# Patient Record
Sex: Female | Born: 1970 | Hispanic: No | Marital: Single | Smoking: Never smoker
Health system: Southern US, Community
[De-identification: ages and names within clinical notes are randomized; demographics above are authoritative.]

## PROBLEM LIST (undated history)

## (undated) DIAGNOSIS — Z973 Presence of spectacles and contact lenses: Secondary | ICD-10-CM

## (undated) DIAGNOSIS — G629 Polyneuropathy, unspecified: Secondary | ICD-10-CM

## (undated) DIAGNOSIS — G600 Hereditary motor and sensory neuropathy: Secondary | ICD-10-CM

## (undated) DIAGNOSIS — J302 Other seasonal allergic rhinitis: Secondary | ICD-10-CM

## (undated) DIAGNOSIS — K219 Gastro-esophageal reflux disease without esophagitis: Secondary | ICD-10-CM

## (undated) DIAGNOSIS — K5909 Other constipation: Secondary | ICD-10-CM

## (undated) DIAGNOSIS — N840 Polyp of corpus uteri: Secondary | ICD-10-CM

## (undated) DIAGNOSIS — J45909 Unspecified asthma, uncomplicated: Secondary | ICD-10-CM

## (undated) DIAGNOSIS — T7840XA Allergy, unspecified, initial encounter: Secondary | ICD-10-CM

## (undated) HISTORY — DX: Hereditary motor and sensory neuropathy: G60.0

## (undated) HISTORY — PX: DILATION AND CURETTAGE OF UTERUS: SHX78

## (undated) HISTORY — DX: Unspecified asthma, uncomplicated: J45.909

## (undated) HISTORY — PX: TONSILLECTOMY: SUR1361

## (undated) HISTORY — DX: Allergy, unspecified, initial encounter: T78.40XA

---

## 1995-03-30 HISTORY — PX: OTHER SURGICAL HISTORY: SHX169

## 1997-03-29 HISTORY — PX: TUBAL LIGATION: SHX77

## 1997-07-19 ENCOUNTER — Other Ambulatory Visit: Admission: RE | Admit: 1997-07-19 | Discharge: 1997-07-19 | Payer: Self-pay | Admitting: Gynecology

## 1998-02-07 ENCOUNTER — Ambulatory Visit (HOSPITAL_COMMUNITY): Admission: AD | Admit: 1998-02-07 | Discharge: 1998-02-07 | Payer: Self-pay | Admitting: Gynecology

## 1998-05-01 ENCOUNTER — Other Ambulatory Visit: Admission: RE | Admit: 1998-05-01 | Discharge: 1998-05-01 | Payer: Self-pay | Admitting: Obstetrics and Gynecology

## 1998-09-27 ENCOUNTER — Emergency Department (HOSPITAL_COMMUNITY): Admission: EM | Admit: 1998-09-27 | Discharge: 1998-09-27 | Payer: Self-pay | Admitting: Emergency Medicine

## 1999-09-04 ENCOUNTER — Ambulatory Visit (HOSPITAL_COMMUNITY): Admission: RE | Admit: 1999-09-04 | Discharge: 1999-09-04 | Payer: Self-pay | Admitting: Obstetrics and Gynecology

## 1999-09-04 ENCOUNTER — Encounter: Payer: Self-pay | Admitting: Obstetrics and Gynecology

## 1999-11-09 ENCOUNTER — Emergency Department (HOSPITAL_COMMUNITY): Admission: EM | Admit: 1999-11-09 | Discharge: 1999-11-09 | Payer: Self-pay | Admitting: Emergency Medicine

## 2000-03-23 ENCOUNTER — Other Ambulatory Visit: Admission: RE | Admit: 2000-03-23 | Discharge: 2000-03-23 | Payer: Self-pay | Admitting: Obstetrics and Gynecology

## 2001-01-04 ENCOUNTER — Other Ambulatory Visit: Admission: RE | Admit: 2001-01-04 | Discharge: 2001-01-04 | Payer: Self-pay | Admitting: Obstetrics and Gynecology

## 2001-12-30 ENCOUNTER — Encounter: Payer: Self-pay | Admitting: Emergency Medicine

## 2001-12-30 ENCOUNTER — Emergency Department (HOSPITAL_COMMUNITY): Admission: EM | Admit: 2001-12-30 | Discharge: 2001-12-30 | Payer: Self-pay | Admitting: Emergency Medicine

## 2002-01-06 ENCOUNTER — Encounter: Payer: Self-pay | Admitting: *Deleted

## 2002-01-06 ENCOUNTER — Inpatient Hospital Stay (HOSPITAL_COMMUNITY): Admission: AD | Admit: 2002-01-06 | Discharge: 2002-01-06 | Payer: Self-pay | Admitting: *Deleted

## 2002-06-15 ENCOUNTER — Other Ambulatory Visit: Admission: RE | Admit: 2002-06-15 | Discharge: 2002-06-15 | Payer: Self-pay | Admitting: Obstetrics and Gynecology

## 2003-02-10 ENCOUNTER — Inpatient Hospital Stay (HOSPITAL_COMMUNITY): Admission: AD | Admit: 2003-02-10 | Discharge: 2003-02-10 | Payer: Self-pay | Admitting: Obstetrics and Gynecology

## 2003-07-17 ENCOUNTER — Other Ambulatory Visit: Admission: RE | Admit: 2003-07-17 | Discharge: 2003-07-17 | Payer: Self-pay | Admitting: Obstetrics and Gynecology

## 2003-10-30 ENCOUNTER — Inpatient Hospital Stay (HOSPITAL_COMMUNITY): Admission: AD | Admit: 2003-10-30 | Discharge: 2003-10-30 | Payer: Self-pay | Admitting: Obstetrics and Gynecology

## 2003-11-02 ENCOUNTER — Inpatient Hospital Stay (HOSPITAL_COMMUNITY): Admission: AD | Admit: 2003-11-02 | Discharge: 2003-11-02 | Payer: Self-pay | Admitting: Obstetrics and Gynecology

## 2003-11-05 ENCOUNTER — Inpatient Hospital Stay (HOSPITAL_COMMUNITY): Admission: AD | Admit: 2003-11-05 | Discharge: 2003-11-05 | Payer: Self-pay | Admitting: Obstetrics and Gynecology

## 2003-11-13 ENCOUNTER — Inpatient Hospital Stay (HOSPITAL_COMMUNITY): Admission: AD | Admit: 2003-11-13 | Discharge: 2003-11-13 | Payer: Self-pay | Admitting: Obstetrics and Gynecology

## 2004-04-23 ENCOUNTER — Ambulatory Visit: Payer: Self-pay | Admitting: Family Medicine

## 2004-07-15 ENCOUNTER — Ambulatory Visit: Payer: Self-pay | Admitting: Family Medicine

## 2005-03-04 ENCOUNTER — Ambulatory Visit (HOSPITAL_COMMUNITY): Admission: RE | Admit: 2005-03-04 | Discharge: 2005-03-04 | Payer: Self-pay | Admitting: Obstetrics and Gynecology

## 2005-05-14 ENCOUNTER — Observation Stay (HOSPITAL_COMMUNITY): Admission: AD | Admit: 2005-05-14 | Discharge: 2005-05-15 | Payer: Self-pay | Admitting: Obstetrics and Gynecology

## 2005-05-14 ENCOUNTER — Encounter (INDEPENDENT_AMBULATORY_CARE_PROVIDER_SITE_OTHER): Payer: Self-pay | Admitting: Specialist

## 2005-10-16 ENCOUNTER — Emergency Department (HOSPITAL_COMMUNITY): Admission: EM | Admit: 2005-10-16 | Discharge: 2005-10-16 | Payer: Self-pay | Admitting: Emergency Medicine

## 2005-10-20 ENCOUNTER — Ambulatory Visit: Payer: Self-pay | Admitting: Obstetrics and Gynecology

## 2005-11-05 ENCOUNTER — Ambulatory Visit: Payer: Self-pay | Admitting: Gynecology

## 2005-12-01 ENCOUNTER — Ambulatory Visit: Payer: Self-pay | Admitting: Obstetrics and Gynecology

## 2005-12-29 ENCOUNTER — Ambulatory Visit (HOSPITAL_COMMUNITY): Admission: RE | Admit: 2005-12-29 | Discharge: 2005-12-29 | Payer: Self-pay | Admitting: Obstetrics and Gynecology

## 2005-12-29 ENCOUNTER — Ambulatory Visit: Payer: Self-pay | Admitting: Obstetrics and Gynecology

## 2006-01-13 ENCOUNTER — Ambulatory Visit: Payer: Self-pay | Admitting: Obstetrics and Gynecology

## 2006-05-17 ENCOUNTER — Inpatient Hospital Stay (HOSPITAL_COMMUNITY): Admission: AD | Admit: 2006-05-17 | Discharge: 2006-05-17 | Payer: Self-pay | Admitting: Obstetrics and Gynecology

## 2006-05-19 ENCOUNTER — Encounter (INDEPENDENT_AMBULATORY_CARE_PROVIDER_SITE_OTHER): Payer: Self-pay | Admitting: Specialist

## 2006-05-19 ENCOUNTER — Ambulatory Visit (HOSPITAL_COMMUNITY): Admission: AD | Admit: 2006-05-19 | Discharge: 2006-05-20 | Payer: Self-pay | Admitting: Obstetrics and Gynecology

## 2007-09-01 ENCOUNTER — Ambulatory Visit (HOSPITAL_COMMUNITY): Admission: RE | Admit: 2007-09-01 | Discharge: 2007-09-01 | Payer: Self-pay | Admitting: Obstetrics and Gynecology

## 2007-11-01 ENCOUNTER — Encounter: Admission: RE | Admit: 2007-11-01 | Discharge: 2007-11-01 | Payer: Self-pay | Admitting: Family Medicine

## 2008-12-05 ENCOUNTER — Emergency Department (HOSPITAL_COMMUNITY): Admission: EM | Admit: 2008-12-05 | Discharge: 2008-12-05 | Payer: Self-pay | Admitting: Emergency Medicine

## 2009-02-08 ENCOUNTER — Emergency Department (HOSPITAL_COMMUNITY): Admission: EM | Admit: 2009-02-08 | Discharge: 2009-02-08 | Payer: Self-pay | Admitting: Emergency Medicine

## 2010-01-29 ENCOUNTER — Encounter: Admission: RE | Admit: 2010-01-29 | Discharge: 2010-01-29 | Payer: Self-pay | Admitting: Family Medicine

## 2010-04-19 ENCOUNTER — Encounter: Payer: Self-pay | Admitting: Family Medicine

## 2010-07-30 ENCOUNTER — Telehealth: Payer: Self-pay | Admitting: *Deleted

## 2010-07-30 NOTE — Telephone Encounter (Signed)
Per Dr Jarold Motto he will not accept the patient from Dr Elnoria Howard, She was last seen with Dr. Elnoria Howard on 07/20/2010. Patient aware and she would like our office to ask another MD, I have given the records to Milford Cage she will forward them to Dr. Marina Goodell for review and she will contact patient with answer.

## 2010-08-04 NOTE — Telephone Encounter (Signed)
Dr Marina Goodell declined to accept patient into the practice also. I have left a message for patient on her voicemail.

## 2010-08-14 NOTE — Group Therapy Note (Signed)
NAME:  Brandi Erickson, Brandi Erickson NO.:  [id number]   MEDICAL RECORD NO.:  [id number]          PATIENT TYPE:  WOC   LOCATION:  [facility]                   FACILITY:  [facility]   PHYSICIAN:  Argentina Donovan, MD        DATE OF BIRTH:  07/17/1970   DATE OF SERVICE:  10/20/2005                                    CLINIC NOTE   The patient is a 40 year old African-American female gravida 6 para 2-0-4-2  with two children ages 82 and 53 who has a long history of allergies with a 2-  year history of severe bronchial asthma with multiple attacks.  She is on  many medications.  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  She came in today because she had a  laparoscopy 6-8 months ago by Dr. Marcelle Overlie that revealed multiple ovarian  cysts and torsion of the right fallopian tube.  She discussed wanting a  partial hysterectomy so that then she would be able to think about getting  pregnant again.  She obviously did not understand what hysterectomy was,  what the consequences for that was, what the consequences of ovarian cysts  are, problem of leaving the ovaries was so we had a long consultation  discussing hysterectomy, the fact that she would not be able to have any  children after that, that we may need more surgery if we leave the cysts  which are her main complaint having multiple painful cysts especially on the  right side and she said she had a videotape of the torsion of her tube which  she is going to bring in for me to review.  I told her we would review that,  get her medical records from Drs. [place] and [place], have her come back  in 2 weeks and at that time be able to make a more intelligent decision and  informing her what her problems were and what she may want done.  I have  told her that if she has surgery it is not because she has cancer but it is  for comfort and for that she has to make up her own mind.  She also  complains of  unknown reason for weight gaining, abdominal girth increase, it  sounds like general malaise around the time of her period.   IMPRESSION:  1.  Many pelvic complaints.  2.  Bronchial asthma.  3.  Degenerating bone disease.           ______________________________  Argentina Donovan, MD     PR/MEDQ  D:  10/20/2005  T:  10/20/2005  Job:  [id number]

## 2010-08-14 NOTE — Group Therapy Note (Signed)
NAME:  Brandi Erickson, BROCKSMITH NO.:  [id number]   MEDICAL RECORD NO.:  [id number]          PATIENT TYPE:  WOC   LOCATION:  [facility]                   FACILITY:  [facility]   PHYSICIAN:  Argentina Donovan, MD        DATE OF BIRTH:  1970/08/29   DATE OF SERVICE:  12/01/2005                                    CLINIC NOTE   The patient is a 40 year old African American female gravida 6, para 2-0-4-2  with a long history of allergies, especially asthma severe with multiple  attacks.  She is on many medications as well as a degenerating bone disease,  Charcot-Marie-Tooth.  She came in today because she had seen Dr. Mia Creek a  short-time ago who thought that probably she might benefit or we might find  some alternate reason for her complaints of chronic abdominal and pelvic  pain, which she says has caused weight gain and increased abdominal girth.  It does not seem to be associated with her period but is extremely sharp and  it radiates into her back.  She had a laparoscopy about a year ago by Dr.  Marcelle Overlie that revealed multiple ovarian cysts and torsion of the right tube  and ovary.  She also has been seeing a gastroenterologist who has been  treating her with medication but has done no diagnostic testing she states.  I agree with Dr. Mia Creek and we will go ahead and schedule her for an  exploratory laparoscopy for chronic pelvic pain and abdominal pain with  increasing abdominal girth, severe bronchial asthma and degenerating bone  disease.  The patient has no latex allergy.  She is allergic to penicillin  and sulfa and seasonal allergies quite severe.  Her list of medications are  long and significant and are attached to the chart.           ______________________________  Argentina Donovan, MD     PR/MEDQ  D:  12/01/2005  T:  12/01/2005  Job:  [id number]

## 2010-08-14 NOTE — Op Note (Signed)
NAME:  Brandi Erickson, Brandi Erickson                 ACCOUNT NO.:  [id number]   MEDICAL RECORD NO.:  [id number]          PATIENT TYPE:  AMB   LOCATION:  [facility]                           [facility]   PHYSICIAN:  Duke Salvia. Marcelle Overlie, M.D.DATE OF BIRTH:  [birth year]   DATE OF PROCEDURE:  03/04/2005  DATE OF DISCHARGE:                                 OPERATIVE REPORT   PREOPERATIVE DIAGNOSIS:  Pelvic pain, history of prior tubal reversal.   POSTOPERATIVE DIAGNOSIS:  Anterior abdominal wall omental adhesions,  bilateral obstructed tubes with minimal dye spill on the right side with  evidence of a right tubal stricture.   SURGEON:  Duke Salvia. Marcelle Overlie, M.D.   ANESTHESIA:  General endotracheal.   COMPLICATIONS:  None.   DRAINS:  Foley catheter.   BLOOD LOSS:  Minimal   SPECIMENS REMOVED:  None.   PROCEDURE AND FINDINGS:  The patient was taken to the operating room after  an adequate level of general endotracheal anesthesia was obtained with the  patient's legs stirrups. The abdomen, perineum and vagina prepped and usual  manner for laparoscopy. The bladder was drained, EUA carried out. Uterus mid  position, normal size. Adnexa negative. A Kahn cannula was positioned for  later dye insufflation. Attention directed to the abdomen. She had several  prior laparoscopy scars in the subumbilical area. This was infiltrated with  half percent Marcaine plain. A small incision was made and the Veress needle  was introduced without difficulty. Its intra-abdominal position was verified  by pressure and water testing. On attempting insufflation however the  pressure readings were moderately high at 15-18. The Veress needle was  removed, repositioned with good positioning by palpation with normal saline  test also. However on insufflation again initially pressures were good at 7  to 8 but increased to 15 and so decision was made to proceed with open  laparoscopy. [employer] retractors were positioned. She had some  moderate  scar tissue in the subumbilical area which was dissected. The fascia was  then entered without difficulty and separated for a short distance. Army-  Engineer, water were repositioned until the peritoneum could be easily  identified and this was entered sharply with the [employer] retractors  repositioned. The surgeon's finger was then used to explore the round area  of the incision which was free and clear. There was no bowel adherent into  this area. The Hasson open cannula was then positioned and secured and  pneumoperitoneum was then easily created. Once this was accomplished, the  scope was inserted. There was some dense anterior abdominal wall omental  adhesions, primarily into the mid and left mid lower abdomen that precluded  visualization without sewing in the scope around to the right and down into  the pelvis for better clarification. Once this was accomplished the patient  was placed in Trendelenburg. Three fingerbreadths above the symphysis in the  midline a 5 mm trocar was inserted under direct visualization. Pelvic  findings revealed that the anterior-posterior space were free and clear.  Bilateral adnexa were unremarkable. The fimbriated ends of both sides were  normal. There  was a stricture in the right mid tube. Left tube appeared to  be more normal but on dye insufflation significant pressure was encountered  with only minimal spill on the right side and no spill at all noted on the  left side despite several attempts. After these findings were video and  photo documented, reinspection of the area of the omentum and the anterior  abdominal wall revealed no evidence of any bleeding or trauma. Prior to  closure sponge, needle, and instrument counts reported as correct x2. The  omental adhesions were extensive and were not taken down. The fascia then  closed with a 2-0 running suture and the subumbilical area with a 4-0 Vicryl  subcuticular and Dermabond on the lower  incision. She tolerated this well  and went to recovery room in good condition.      Richard M. Marcelle Overlie, M.D.  Electronically Signed     RMH/MEDQ  D:  03/04/2005  T:  03/04/2005  Job:  [id number]

## 2010-08-14 NOTE — Group Therapy Note (Signed)
NAME:  Brandi Erickson, Brandi Erickson NO.:  [id number]   MEDICAL RECORD NO.:  [id number]          PATIENT TYPE:  WOC   LOCATION:  [facility]                   FACILITY:  [facility]   PHYSICIAN:  Ginger Carne, MD DATE OF BIRTH:  [birth year]   DATE OF SERVICE:  11/05/2005                                    CLINIC NOTE   REASON FOR CONSULTATION:  Bilateral pelvic pain and review of video  laparoscopy.   HISTORY OF PRESENT ILLNESS:  This is a 40 year old African-American female  who had a diagnostic laparoscopy with hydrotubation by Dr. Marcelle Overlie.  The  patient states that she has pelvic pain and cysts from time to time on  either ovary.  Her history is well known and is documented in the chart.  On  review of her laparoscopy I do not see multiple cysts.  There was no  spillage of dye on the left tube.  The right tube did have dye spillage.  The tube is not twisted.  It is stenosed in the ampullary region consistent  with a previous ectopic pregnancy.  There is a small area of what appears to  be endometriosis on the right ovary.  The video is of average quality and I  could not be certain of this finding.  There is some adhesive disease of  omentum to the cul-de-sac but otherwise an unremarkable pelvis.  The  appendix was not visualized on the laparoscopy and there was no adequate  visualization to rule out femoral, inguinal or obturator hernias.   I am concerned that the patient has the impression that her ovarian cysts  are the source of her pain, which I am doubtful of.  I did explain to the  patient that women normally do have physiological changes in their ovaries  producing small cysts up to a centimeter or even 2 cm from time to time.  These can be painful depending on their size and pain threshold of the  patient.  She is also concerned because of her history of degenerating bone  disease and loss of ovaries.   IMPRESSION AND PLAN:  I am not convinced that her pain is  constant or  significant enough to require definitive surgical management.  Certainly,  although her pain is primarily on the right adnexa, I am uncertain that  removal of said tube and ovary on the right side with or without an  appendectomy would relieve her pain.  Unfortunately, the laparoscopic  evaluation on video is not adequate enough to determine the source of her  pain or to rule out factors associated with her discomfort.  I suggested  that another option would be to re-scope her to evaluate all the unaddressed  issues and then proceed from there as far as what would be good management.  She said she will go home and think about this.           ______________________________  Ginger Carne, MD     SHB/MEDQ  D:  11/05/2005  T:  11/05/2005  Job:  [id number]

## 2010-08-14 NOTE — H&P (Signed)
NAME:  Brandi Erickson, Brandi Erickson              ACCOUNT NO.:  [id number]   MEDICAL RECORD NO.:  [id number]           PATIENT TYPE:   LOCATION:                                 FACILITY:   PHYSICIAN:  Duke Salvia. Marcelle Overlie, M.D.DATE OF BIRTH:  02/26/1968   DATE OF ADMISSION:  03/01/2005  DATE OF DISCHARGE:                                HISTORY & PHYSICAL   CHIEF COMPLAINT:  Chronic pelvic pain.   HISTORY OF PRESENT ILLNESS:  A 40 year old, G6, P2-0-4-2.  This patient had  a tubal in 1996 with a reversal in 1997.  She had been treated with Clomid  for anovulation and did conceive in May of 2006 but had a SAB.  Primarily  due to chronic right lower and mid pelvic pain, she presents now for  diagnostic laparoscopy with tubal dye studies.  This procedure including the  risk of bleeding, infection, adjacent organ injury, and the possible need  for open or additional surgery all reviewed with her specifically treatment  of adhesions or endometriosis reviewed with her today also.   PAST MEDICAL HISTORY:   ALLERGIES:  Penicillin and sulfa.   CURRENT MEDICATIONS:  Cymbalta, Tylenol No. 3 p.r.n., Singulair, Advair,  Zelnorm, MiraLax, Zantac p.r.n., Clarinex p.r.n.,   REVIEW OF SYSTEMS:  She is followed for chronic constipation by Everardo All.  Madilyn Fireman, M.D.  Review of systems otherwise significant for tubal in 1996 and  tubal reversal in 1997 as noted.  She has Charcot-Marie-Tooth disease and  mild asthma, currently taking medications as noted above.  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  Physical  features include foot drop or changes in gait weakness of the small muscles  in the feet.   PHYSICAL EXAMINATION:  Temperature 98.2, blood pressure 120/90.  HEENT unremarkable.  NECK:  Is supple without masses.  LUNGS:  Are clear.  CARDIOVASCULAR:  Regular rate and rhythm without murmurs, rubs or gallops.  BREASTS:  Without masses.  ABDOMEN:  Is soft but non-tender.  PELVIC  EXAM:  Normal external genitalia.  Heberden's nodes are clear.  Uterus positional size.  Adnexa negative.  EXTREMITIES:  Unremarkable.  NEUROLOGIC:  Unremarkable.   IMPRESSION:  Pelvic pain, history of prior tubal reversal.   PLAN:  DL with tubal dye studies.  Procedure and risks reviewed as above.      Richard M. Marcelle Overlie, M.D.  Electronically Signed     RMH/MEDQ  D:  03/01/2005  T:  03/01/2005  Job:  [id number]   cc:   Everardo All. Madilyn Fireman, M.D.  Fax: [phone]

## 2010-08-14 NOTE — Op Note (Signed)
NAME:  Brandi Erickson, ANTILLA NO.:  [id number]   MEDICAL RECORD NO.:  [id number]          PATIENT TYPE:  OBV   LOCATION:  9319                          [facility]ILITY:  [facility]   PHYSICIAN:  Juluis Mire, M.D.   DATE OF BIRTH:  [birth year]   DATE OF PROCEDURE:  05/14/2005  DATE OF DISCHARGE:                                 OPERATIVE REPORT   PREOPERATIVE DIAGNOSIS:  Ectopic pregnancy.   POSTOPERATIVE DIAGNOSIS:  Apparent extruded ampullary ectopic from the left  side.   OPERATIVE PROCEDURES:  1.  Open laparoscopy.  2.  Linear salpingostomy.  3.  Removal of products of conception.   SURGEON:  Juluis Mire, M.D.   ANESTHESIA:  General endotracheal.   ESTIMATED BLOOD LOSS:  150-200 mL.   PACKS AND DRAINS:  None.   INTRAOPERATIVE BLOOD REPLACED:  None.   COMPLICATIONS:  None.   INDICATIONS:  Dictated in the history and physical.   PROCEDURE:  The patient was taken to the OR and placed in the supine  position.  After a satisfactory level of general endotracheal anesthesia  obtained, the patient was placed in a dorsal lithotomy position using Allen  stirrups.  The abdomen, perineum and vagina were prepped out with Betadine.  The bladder was emptied by in-and-out catheterization.  A speculum was  placed in the vaginal vault and the cervix grasped with a single-tooth  tenaculum.  The Corson cannula was put in place and secured.  The patient  was then draped as a sterile field.  A subumbilical incision made with a  knife and extended through subcutaneous tissue.  The fascia was identified  and an incision was made in the fascia and extended laterally.  The muscle  was separated in the midline.  We were able to bluntly entered the  peritoneal cavity.  There were omental adhesions to the right side.  A Taut  open laparoscopic trocar was put in place and secured.  The abdomen was  inflated with carbon dioxide.  The laparoscope was introduced.  There was no  evidence of  injury to adjacent organs.  A 5 mm trocar was put in place in  the suprapubic area.  The uterus was elevated.  She had a large amount of  clot and material in the cul-de-sac.  The right tube and ovary were  unremarkable.  The left tube was adherent to the clotted material.  We used  irrigation to remove the clotted material.  It looked like part of the  products of conception were in this, and this was taken out through the  subumbilical trocar and sent for pathology.  The left tube was dilated.  It  was difficult to tell whether the products already extruded from the end of  the tube.  We put in a third trocar in the left lower quadrant after  visualization of the epigastric vessels.  At this point in time we used a  needle point driver so made a linear salpingostomy.  It did not look like  there was any further products of conception in the tube.  It looked  like it  was probably a pregnancy at the end of the fallopian tube.  We thoroughly  irrigated the pelvis and brought about hemostasis using the bipolar at this  point in time.  We had good hemostasis and all irrigation was then removed.  The abdomen was deflated of its carbon dioxide, all trocars removed, the  subumbilical fascia closed with two figure-of-eights of 0 Vicryl, the skin  with interrupted subcuticulars of 4-0 Vicryl, the suprapubic incision closed  with Dermabond.  The Corson cannula and single-tooth tenaculum were removed.  The patient was taken out of the dorsal lithotomy position, once alert and  extubated transferred to the recovery room in good condition.  Sponge,  instrument and needle count reported as correct by the circulating nurse x2.      Juluis Mire, M.D.  Electronically Signed     JSM/MEDQ  D:  05/14/2005  T:  05/15/2005  Job:  [id number]

## 2010-08-14 NOTE — Op Note (Signed)
NAME:  Brandi Erickson, Brandi Erickson                 ACCOUNT NO.:  [id number]   MEDICAL RECORD NO.:  [id number]          PATIENT TYPE:  AMB   LOCATION:  [facility]                           [facility]   PHYSICIAN:  Phil D. Okey Dupre, M.D.     DATE OF BIRTH:  16-Mar-1971   DATE OF PROCEDURE:  12/29/2005  DATE OF DISCHARGE:                                 OPERATIVE REPORT   PROCEDURE:  Diagnostic laparoscopy.   PREOPERATIVE DIAGNOSIS:  Chronic pelvic pain, especially right adnexa.   POSTOPERATIVE DIAGNOSIS:  Normal female pelvic with a few filmy adhesions.   SURGEON:  Javier Glazier. Okey Dupre, M.D.   FIRST ASSISTANT:  Ginger Carne, M.D.   ANESTHESIA:  General.   ESTIMATED BLOOD LOSS:  Less than 2 mL.   POSTOPERATIVE CONDITION:  Satisfactory.   PATHOLOGY SPECIMENS:  None.   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  No sign of endometriosis,  ovarian cysts, adhesions, or trapped ovaries.  No sign of any leiomyomata  uteri.   PROCEDURE:  Under satisfactory general anesthesia with the patient in dorsal  lithotomy position, the perineum, vagina, and abdomen were prepped and  draped in usual sterile manner.   A Graves cervical speculum was used to visualize the cervix which was  grasped with a single-tooth tenaculum.  An acorn cannula was placed on the  cervix for attachment of the tenaculum for mobilization of the uterus.  A 1-  cm transverse incision was made just below the umbilicus.  CO2 was injected  through a Veress needle into the peritoneal cavity.  The Veress needle was  removed.  The laparoscope and trocar were inserted into the peritoneal  cavity.  The trocar removed and the sleeve of the laparoscope inserted.  The  findings were as above.  The scope was removed and as much  CO2 as possible  expressed through the sleeve and the sleeve removed.  The fascia was closed  with a 0 Vicryl suture which was continued up for subcuticular closure, and  Dermabond was used for final closure of the incision.  Dry sterile dressings  were applied.  The tenaculum was removed from vagina and the patient  transferred to the recovery room in satisfactory condition, having tolerated  the procedure well.           ______________________________  Javier Glazier. Okey Dupre, M.D.     PDR/MEDQ  D:  12/29/2005  T:  12/30/2005  Job:  [id number]

## 2010-08-14 NOTE — Op Note (Signed)
NAME:  Brandi Erickson, Brandi Erickson NO.:  [id number]   MEDICAL RECORD NO.:  [id number]         PATIENT TYPE:  WOIB   LOCATION:                                FACILITY:  [facility]   PHYSICIAN:  Duke Salvia. Marcelle Overlie, M.D.DATE OF BIRTH:  12-18-1970   DATE OF PROCEDURE:  05/19/2006  DATE OF DISCHARGE:                               OPERATIVE REPORT   PREOPERATIVE DIAGNOSIS:  Recurrent right ectopic pregnancy.   POSTOPERATIVE DIAGNOSIS:  Recurrent right ectopic pregnancy.   PROCEDURE:  Diagnostic laparoscopy followed by partial right  salpingectomy.   SURGEON:  Duke Salvia. Marcelle Overlie, M.D.   ANESTHESIA:  General endotracheal anesthesia.   ESTIMATED BLOOD LOSS:  Less than 50 mL.   SPECIMENS REMOVED:  Partial right salpingectomy including ectopic  pregnancy.   PROCEDURE AND FINDINGS:  The patient was taken to the operating room.  After an adequate level of general endotracheal anesthesia was obtained  with the patient's legs in stirrups, the abdomen, perineum and vagina  were prepped and draped in the usual manner for laparoscopy.  The  bladder was drained. EUA was carried out.  The uterus upper limit of  normal size, adnexa negative.  A Hulka tenaculum was positioned.  Attention was directed to the abdomen.  The subumbilical area was  infiltrated 0.5% Marcaine plain.  A small incision was made. The Veress  needle was introduced without difficulty.  Its intra-abdominal position  was verified by pressure and water testing.  A 2.5 liter  pneumoperitoneum was then created.  The laparoscopic trocar and sleeve  were then introduced without difficulty.  There was no evidence of any  bleeding or trauma.  In subumbilical area to the left, on the left  anterior abdominal wall, were omental adhesions.  There was no bowel  noted in this area.  The scope could be easily passed around to the  right, allowing visualization of the pelvis.  The patient was then  placed in Trendelenburg.  A 5 mm trocar  was inserted 3 fingerbreadths  above the symphysis in the midline under direct visualization.  The  findings were as follows.   The uterus, itself, was upper limits of normal size.  There was a 3 to  3.5 cm unruptured ectopic gestation on the mid to distal right tube. The  fimbria appeared to be occluded. The right ovary beneath it was normal.  The left ovary had dense periadnexal adhesions.  The left tube had dense  peritubular adhesions.  The left ovary was difficult to visualize, I  could not see a good view of the left fimbria which appeared to be  obliterated. Because of recurrence of the ectopic gestation and the  size, the decision was made to proceed with partial salpingectomy, which  the patient had consented to preoperatively.  The gyrus PK instrument  was used to coagulate the mesosalpinx after placing the ectopic  gestation on traction.  This was coagulated and divided leaving the  remaining right ovary and a portion of proximal tube.  The lower  incision was then enlarged to approximately 2 cm to the fascia  and a  Babcock clamp passed to the lower incision excision and the entire mass  was removed intact.  The Nehzat was then inserted through the lower  incision using towel clamps to secure pneumoperitoneum and the pelvis  was irrigated with saline and aspirated.  Pictures were taken  postoperatively.  Excellent hemostasis.  Prior to closure, sponge,  needle, and instrument counts were reported as correct x2.  The  instruments were removed.  The gas was allowed to escape. The  lower incision, the fascia was closed which was opened approximately 2  to 2.5 cm, with a 0 Vicryl suture.  A 4-0 Vicryl was then used  subcuticular to close the skin.  The same on the subumbilical incision.  She will receive a short course of Vibramycin and went to the recovery  room in good condition.      Richard M. Marcelle Overlie, M.D.  Electronically Signed     RMH/MEDQ  D:  05/19/2006  T:   05/19/2006  Job:  [id number]

## 2010-08-14 NOTE — Discharge Summary (Signed)
NAME:  Brandi Erickson, Brandi Erickson                 ACCOUNT NO.:  [id number]   MEDICAL RECORD NO.:  [id number]          PATIENT TYPE:  OIB   LOCATION:  9313                          FACILITY:  [facility]   PHYSICIAN:  Duke Salvia. Marcelle Overlie, M.D.DATE OF BIRTH:  Apr 19, 1970   DATE OF ADMISSION:  05/19/2006  DATE OF DISCHARGE:  05/20/2006                               DISCHARGE SUMMARY   DISCHARGE DIAGNOSES:  1. Recurrent right tubal ectopic gestation.  2. Diagnostic laparotomy with right partial salpingectomy this      admission.   SUMMARY OF PHYSICAL AND EXAMINATION:  Pleas see admission H&P for  details.  Briefly, a 40 year old who has had a tubal reanastomosis.  She  has had two right ectopic pregnancies since that time, now presents with  a third.  Earlier in a week she had declined surgery.  I had opted for  methotrexate even though a 3 cm gestational sac was seen in the right  adnexa.  She presented yesterday complaining of increased pain, although  her ultrasound showed no free fluid and her hemoglobin was stable. Her  preference at that point was to proceed with definitive surgery.  We  reviewed the possibilities of salpingectomy versus laparotomy with  salpingectomy depending on the findings.   HOSPITAL COURSE:  The patient was taken from MAU to the operating room.  Laparoscopy was performed demonstrating a large right tubal gestation  that was little leaking minimal blood at that point but had not  ruptured.  A right partial salpingectomy was carried out without  difficulty.  She had some adnexal adhesions on the left tube which were  noted previously.  She was observed overnight.  The following a.m. she  was afebrile.  Her hemoglobin had dropped from 12.2 to 10.9 which was  stable.  Her abdominal exam was unremarkable.  She was afebrile,  tolerating a regular diet and was ready for discharge at that point.   DISPOSITION:  The patient discharged on Tylox p.r.n. pain.  Return to  the office in 1 week  for quantitative HCG.  We will check her blood type  before she is discharged to see if she is a RhoGAM candidate.  Advised  to report any incisional redness or drainage, increased pain or bleeding  or fever over 101.  She was given specific instructions regarding diet,  sex, exercise.      Richard M. Marcelle Overlie, M.D.  Electronically Signed     RMH/MEDQ  D:  05/20/2006  T:  05/20/2006  Job:  [id number]

## 2010-08-14 NOTE — H&P (Signed)
NAME:  Brandi Erickson, Brandi Erickson NO.:  [id number]   MEDICAL RECORD NO.:  [id number]         PATIENT TYPE:  WOBV   LOCATION:                                [facility]ACILITY:  [facility]   PHYSICIAN:  Juluis Mire, M.D.   DATE OF BIRTH:  09-29-1970   DATE OF ADMISSION:  05/24/2005  DATE OF DISCHARGE:  05/15/2004                                HISTORY & PHYSICAL   HISTORY OF PRESENT ILLNESS:  The patient is a 40 year old, gravida 7, para  2, abortus 4, female whose last menstrual period was April 22, 2005.  She  presented to the office yesterday with early pregnancy and a history of a  tubal reversal.  She had a quantitative yesterday of 846.3.  Was evaluated  by Dr. Vincente Poli.  An ultrasound did reveal a large mass in the left adnexa,  measuring 8 x 4 x 3 cm, that looked like loculated fluid, probable ectopic  and/or ruptured ectopic although no exact free fluid was noted.  She was  brought back in today.  Her quantitative had dropped slightly to 810.  The  ultrasound revealed similar findings.  In view of the previous tubal  reversal, and these ultrasound findings, we are going to proceed with  laparoscopic evaluation to rule out an ectopic or ruptured ectopic  pregnancy.   ALLERGIES:  In terms of allergies, the patient is allergic to PENICILLIN and  SULFA.   MEDICATIONS:  She has a history of asthma and uses asthma medications.   PAST MEDICAL HISTORY:  1.  Does have a history of asthmatic bronchitis.  2.  She has a history of Charcot-Marie-Tooth muscular dystrophy.  Does not      seem to be causing any complications.   PAST SURGICAL HISTORY:  1.  Tonsillectomy, age 36.  2.  She had a tubal in 1996, tubal reversal in 1997.   OB HISTORY:  1.  She has had a TAB in 1983.  2.  Vaginal delivery in 1994.  3.  Vaginal delivery in 1996.  4.  Spontaneous abortion in 1998.  5.  Spontaneous abortion in 2005.  6.  Spontaneous abortion last year.   FAMILY HISTORY:  Noncontributory.   SOCIAL HISTORY:  No tobacco or alcohol use.   REVIEW OF SYSTEMS:  Noncontributory.   PHYSICAL EXAMINATION:  VITAL SIGNS:  The patient is afebrile with stable  vital signs.  HEENT:  The patient is normocephalic.  Pupils equal, round and reactive to  light and accommodation.  Extraocular movements intact.  Sclerae and  conjunctivae clear. Oropharynx clear.  NECK:  No thyromegaly.  BREASTS: Not examined.  LUNGS:  Clear.  CARDIOVASCULAR:  Regular rate and rhythm.  No murmurs, rubs or gallops.  ABDOMEN:  Abdomen is soft, minimal tenderness on the left side.  No masses  appreciated.  Bowel sounds are active.  PELVIC:  On pelvic exam, no active bleeding.  Uterus normal size and shape.  Left adnexal fullness with tenderness.  Right adnexa unremarkable.  EXTREMITIES:  Trace edema.  NEUROLOGIC:  Grossly within normal limits.   IMPRESSION:  Probable ectopic pregnancy.   PLAN:  The patient will undergo laparoscopic evaluation and potential need  for removal of the fallopian tube has been discussed. We will try to remove  the ectopic.  Risks of surgery explained including the risk of infection,  the risk of hemorrhage and could require transfusion, the risk of AIDS or  hepatitis.  Risk of injury to adjacent organs including bladder, bowel or  ureters.  Risk of deep venous thrombosis and pulmonary embolus.  The patient  expressed understanding of indications and risks.  Also discussed the risk  of chronic ectopic pregnancy and the need for followup is emphasized on DIC.      Juluis Mire, M.D.  Electronically Signed     JSM/MEDQ  D:  05/14/2005  T:  05/15/2005  Job:  [id number]

## 2010-08-14 NOTE — Discharge Summary (Signed)
NAME:  Brandi Erickson, Brandi Erickson NO.:  [id number]   MEDICAL RECORD NO.:  [id number]         PATIENT TYPE:  WOBV   LOCATION:                                [facility]ILITY:  [facility]   PHYSICIAN:  Juluis Mire, M.D.   DATE OF BIRTH:  Apr 02, 1970   DATE OF ADMISSION:  05/04/2005  DATE OF DISCHARGE:  05/15/2005                                 DISCHARGE SUMMARY   ADMITTING DIAGNOSIS:  Ruptured ectopic pregnancy.   DISCHARGE DIAGNOSIS:  Ectopic pregnancy of left fallopian tube, probably  extruded from the end of the tube.   OPERATIVE PROCEDURE:  Diagnostic laparoscopy with linear salpingostomy.  For  a complete history and physical, please see dictated note.   HOSPITAL COURSE:  The patient underwent diagnostic laparoscopy.  She had a  large amount of clotted material in the cul-de-sac.  This was removed.  It  looks like there were some products of conception in this.  We did a linear  salpingostomy, could see no more products of conception in the tube.  Right  tube and ovary were otherwise unremarkable.  The following day, her  quantitive had dropped to 582.  Her hemoglobin was 10.2.  Her blood type was  A positive.  RhoGAM was not required.  At the time of her first postop day,  she was afebrile with stable vital signs.  Abdomen was soft, nontender.  Incisions were clear.  Bowel sounds were active.  She was voiding without  difficulty.  She will be discharged home at this time.   In terms of complications, none occurred during stay in the hospital.  The  patient discharged home in stable condition.   DISPOSITION:  Routine postop instructions were given.  She is to avoid heavy  lifting, vaginal entrance, or driving a car.  She is to watch for signs of  infection, nausea, vomiting, increase in abdominal pain or active fascial  bleeding.  Follow up in the office will be in 1 week.  The need for follow  up quantitative levels have been discussed, and the potential for chronic  ectopic  explained.      Juluis Mire, M.D.  Electronically Signed     Juluis Mire, M.D.  Electronically Signed    JSM/MEDQ  D:  05/15/2005  T:  05/15/2005  Job:  [id number]

## 2010-09-03 ENCOUNTER — Ambulatory Visit: Payer: Medicare Other | Attending: Student | Admitting: Physical Therapy

## 2010-09-03 ENCOUNTER — Ambulatory Visit: Payer: Medicare Other | Admitting: Occupational Therapy

## 2010-09-03 DIAGNOSIS — M2550 Pain in unspecified joint: Secondary | ICD-10-CM | POA: Insufficient documentation

## 2010-09-03 DIAGNOSIS — Z5189 Encounter for other specified aftercare: Secondary | ICD-10-CM | POA: Insufficient documentation

## 2010-09-03 DIAGNOSIS — M6281 Muscle weakness (generalized): Secondary | ICD-10-CM | POA: Insufficient documentation

## 2010-09-03 DIAGNOSIS — G600 Hereditary motor and sensory neuropathy: Secondary | ICD-10-CM | POA: Insufficient documentation

## 2010-09-08 ENCOUNTER — Ambulatory Visit: Payer: Medicare Other | Admitting: Occupational Therapy

## 2010-09-08 ENCOUNTER — Ambulatory Visit: Payer: Medicare Other | Admitting: Physical Therapy

## 2010-09-10 ENCOUNTER — Ambulatory Visit: Payer: Self-pay | Admitting: Physical Therapy

## 2010-09-10 ENCOUNTER — Encounter: Payer: Self-pay | Admitting: Occupational Therapy

## 2010-09-15 ENCOUNTER — Encounter: Payer: Self-pay | Admitting: Occupational Therapy

## 2010-09-16 ENCOUNTER — Ambulatory Visit: Payer: Medicare Other | Admitting: Physical Therapy

## 2010-09-17 ENCOUNTER — Ambulatory Visit: Payer: Self-pay | Admitting: Physical Therapy

## 2010-09-22 ENCOUNTER — Ambulatory Visit: Payer: Self-pay | Admitting: Physical Therapy

## 2010-09-22 ENCOUNTER — Encounter: Payer: Self-pay | Admitting: Occupational Therapy

## 2010-09-23 ENCOUNTER — Ambulatory Visit: Payer: Self-pay | Admitting: Physical Therapy

## 2010-09-24 ENCOUNTER — Encounter: Payer: Self-pay | Admitting: Occupational Therapy

## 2010-09-28 ENCOUNTER — Other Ambulatory Visit: Payer: Self-pay | Admitting: Family Medicine

## 2010-09-28 DIAGNOSIS — N63 Unspecified lump in unspecified breast: Secondary | ICD-10-CM

## 2010-09-28 DIAGNOSIS — N644 Mastodynia: Secondary | ICD-10-CM

## 2010-09-29 ENCOUNTER — Ambulatory Visit: Payer: Medicare Other | Admitting: Physical Therapy

## 2010-09-29 ENCOUNTER — Ambulatory Visit: Payer: Medicare Other | Attending: Student | Admitting: Occupational Therapy

## 2010-09-29 DIAGNOSIS — G600 Hereditary motor and sensory neuropathy: Secondary | ICD-10-CM | POA: Insufficient documentation

## 2010-09-29 DIAGNOSIS — M6281 Muscle weakness (generalized): Secondary | ICD-10-CM | POA: Insufficient documentation

## 2010-09-29 DIAGNOSIS — Z5189 Encounter for other specified aftercare: Secondary | ICD-10-CM | POA: Insufficient documentation

## 2010-09-29 DIAGNOSIS — M2550 Pain in unspecified joint: Secondary | ICD-10-CM | POA: Insufficient documentation

## 2010-10-01 ENCOUNTER — Ambulatory Visit
Admission: RE | Admit: 2010-10-01 | Discharge: 2010-10-01 | Disposition: A | Discharge status: Home / Self Care | Payer: Medicare Other | Source: Ambulatory Visit | Attending: Family Medicine | Admitting: Family Medicine

## 2010-10-01 ENCOUNTER — Other Ambulatory Visit: Payer: Self-pay | Admitting: Family Medicine

## 2010-10-01 DIAGNOSIS — N644 Mastodynia: Secondary | ICD-10-CM

## 2010-10-01 DIAGNOSIS — N63 Unspecified lump in unspecified breast: Secondary | ICD-10-CM

## 2010-10-02 ENCOUNTER — Ambulatory Visit: Payer: Medicare Other | Admitting: Occupational Therapy

## 2010-10-02 ENCOUNTER — Ambulatory Visit: Payer: Self-pay | Admitting: Physical Therapy

## 2010-10-05 ENCOUNTER — Ambulatory Visit: Payer: Self-pay | Admitting: Physical Therapy

## 2010-10-05 ENCOUNTER — Encounter: Payer: Self-pay | Admitting: Occupational Therapy

## 2010-10-07 ENCOUNTER — Encounter: Payer: Self-pay | Admitting: Occupational Therapy

## 2010-10-07 ENCOUNTER — Ambulatory Visit: Payer: Self-pay | Admitting: Physical Therapy

## 2010-10-20 ENCOUNTER — Ambulatory Visit: Payer: Medicare Other | Admitting: Physical Therapy

## 2010-10-20 ENCOUNTER — Encounter: Payer: Medicare Other | Admitting: Occupational Therapy

## 2010-10-22 ENCOUNTER — Encounter: Payer: Medicare Other | Admitting: Occupational Therapy

## 2010-10-22 ENCOUNTER — Ambulatory Visit: Payer: Medicare Other | Admitting: Physical Therapy

## 2010-10-27 ENCOUNTER — Other Ambulatory Visit: Payer: Self-pay | Admitting: Family Medicine

## 2010-10-27 DIAGNOSIS — N6002 Solitary cyst of left breast: Secondary | ICD-10-CM

## 2010-11-02 ENCOUNTER — Ambulatory Visit
Admission: RE | Admit: 2010-11-02 | Discharge: 2010-11-02 | Disposition: A | Discharge status: Home / Self Care | Payer: Medicare Other | Source: Ambulatory Visit | Attending: Family Medicine | Admitting: Family Medicine

## 2010-11-02 DIAGNOSIS — N6002 Solitary cyst of left breast: Secondary | ICD-10-CM

## 2011-04-02 DIAGNOSIS — J309 Allergic rhinitis, unspecified: Secondary | ICD-10-CM | POA: Diagnosis not present

## 2011-04-05 DIAGNOSIS — J309 Allergic rhinitis, unspecified: Secondary | ICD-10-CM | POA: Diagnosis not present

## 2011-04-07 DIAGNOSIS — F411 Generalized anxiety disorder: Secondary | ICD-10-CM | POA: Diagnosis not present

## 2011-04-28 DIAGNOSIS — F411 Generalized anxiety disorder: Secondary | ICD-10-CM | POA: Diagnosis not present

## 2011-04-28 DIAGNOSIS — J309 Allergic rhinitis, unspecified: Secondary | ICD-10-CM | POA: Diagnosis not present

## 2011-05-03 DIAGNOSIS — R1310 Dysphagia, unspecified: Secondary | ICD-10-CM | POA: Diagnosis not present

## 2011-05-03 DIAGNOSIS — J309 Allergic rhinitis, unspecified: Secondary | ICD-10-CM | POA: Diagnosis not present

## 2011-05-03 DIAGNOSIS — R079 Chest pain, unspecified: Secondary | ICD-10-CM | POA: Diagnosis not present

## 2011-05-05 DIAGNOSIS — J45909 Unspecified asthma, uncomplicated: Secondary | ICD-10-CM | POA: Diagnosis not present

## 2011-05-05 DIAGNOSIS — R072 Precordial pain: Secondary | ICD-10-CM | POA: Diagnosis not present

## 2011-05-05 DIAGNOSIS — F332 Major depressive disorder, recurrent severe without psychotic features: Secondary | ICD-10-CM | POA: Diagnosis not present

## 2011-05-12 DIAGNOSIS — F411 Generalized anxiety disorder: Secondary | ICD-10-CM | POA: Diagnosis not present

## 2011-05-12 DIAGNOSIS — J309 Allergic rhinitis, unspecified: Secondary | ICD-10-CM | POA: Diagnosis not present

## 2011-05-14 DIAGNOSIS — J309 Allergic rhinitis, unspecified: Secondary | ICD-10-CM | POA: Diagnosis not present

## 2011-05-17 ENCOUNTER — Ambulatory Visit (HOSPITAL_COMMUNITY)
Admission: RE | Admit: 2011-05-17 | Discharge: 2011-05-17 | Disposition: A | Discharge status: Home Health | Payer: Medicare Other | Source: Ambulatory Visit | Attending: Gastroenterology | Admitting: Gastroenterology

## 2011-05-17 ENCOUNTER — Encounter (HOSPITAL_COMMUNITY)
Admission: RE | Disposition: A | Discharge status: Home Health | Payer: Self-pay | Source: Ambulatory Visit | Attending: Gastroenterology

## 2011-05-17 DIAGNOSIS — R1310 Dysphagia, unspecified: Secondary | ICD-10-CM | POA: Diagnosis not present

## 2011-05-17 HISTORY — PX: ESOPHAGEAL MANOMETRY: SHX5429

## 2011-05-17 SURGERY — MANOMETRY, ESOPHAGUS
Anesthesia: Choice

## 2011-05-17 MED ORDER — LIDOCAINE VISCOUS 2 % MT SOLN
OROMUCOSAL | Status: AC
  Filled 2011-05-17: qty 15

## 2011-05-18 ENCOUNTER — Encounter (HOSPITAL_COMMUNITY): Payer: Self-pay | Admitting: Gastroenterology

## 2011-05-19 DIAGNOSIS — R1310 Dysphagia, unspecified: Secondary | ICD-10-CM | POA: Diagnosis not present

## 2011-05-19 DIAGNOSIS — R079 Chest pain, unspecified: Secondary | ICD-10-CM | POA: Diagnosis not present

## 2011-05-26 DIAGNOSIS — F411 Generalized anxiety disorder: Secondary | ICD-10-CM | POA: Diagnosis not present

## 2011-06-03 DIAGNOSIS — J309 Allergic rhinitis, unspecified: Secondary | ICD-10-CM | POA: Diagnosis not present

## 2011-06-09 DIAGNOSIS — J309 Allergic rhinitis, unspecified: Secondary | ICD-10-CM | POA: Diagnosis not present

## 2011-06-09 DIAGNOSIS — F411 Generalized anxiety disorder: Secondary | ICD-10-CM | POA: Diagnosis not present

## 2011-06-11 DIAGNOSIS — J309 Allergic rhinitis, unspecified: Secondary | ICD-10-CM | POA: Diagnosis not present

## 2011-06-11 DIAGNOSIS — K2100 Gastro-esophageal reflux disease with esophagitis, without bleeding: Secondary | ICD-10-CM | POA: Diagnosis not present

## 2011-06-11 DIAGNOSIS — K219 Gastro-esophageal reflux disease without esophagitis: Secondary | ICD-10-CM | POA: Diagnosis not present

## 2011-06-11 DIAGNOSIS — R1310 Dysphagia, unspecified: Secondary | ICD-10-CM | POA: Diagnosis not present

## 2011-06-11 DIAGNOSIS — J45909 Unspecified asthma, uncomplicated: Secondary | ICD-10-CM | POA: Diagnosis not present

## 2011-06-16 DIAGNOSIS — J309 Allergic rhinitis, unspecified: Secondary | ICD-10-CM | POA: Diagnosis not present

## 2011-06-16 DIAGNOSIS — K219 Gastro-esophageal reflux disease without esophagitis: Secondary | ICD-10-CM | POA: Diagnosis not present

## 2011-06-16 DIAGNOSIS — J45909 Unspecified asthma, uncomplicated: Secondary | ICD-10-CM | POA: Diagnosis not present

## 2011-06-21 DIAGNOSIS — J309 Allergic rhinitis, unspecified: Secondary | ICD-10-CM | POA: Diagnosis not present

## 2011-06-23 DIAGNOSIS — F411 Generalized anxiety disorder: Secondary | ICD-10-CM | POA: Diagnosis not present

## 2011-06-24 DIAGNOSIS — J309 Allergic rhinitis, unspecified: Secondary | ICD-10-CM | POA: Diagnosis not present

## 2011-06-25 DIAGNOSIS — R1319 Other dysphagia: Secondary | ICD-10-CM | POA: Insufficient documentation

## 2011-06-25 DIAGNOSIS — R1310 Dysphagia, unspecified: Secondary | ICD-10-CM | POA: Insufficient documentation

## 2011-06-25 HISTORY — DX: Other dysphagia: R13.19

## 2011-07-07 DIAGNOSIS — J309 Allergic rhinitis, unspecified: Secondary | ICD-10-CM | POA: Diagnosis not present

## 2011-07-07 DIAGNOSIS — F332 Major depressive disorder, recurrent severe without psychotic features: Secondary | ICD-10-CM | POA: Diagnosis not present

## 2011-07-07 DIAGNOSIS — F4542 Pain disorder with related psychological factors: Secondary | ICD-10-CM | POA: Diagnosis not present

## 2011-07-08 DIAGNOSIS — F411 Generalized anxiety disorder: Secondary | ICD-10-CM | POA: Diagnosis not present

## 2011-07-13 DIAGNOSIS — J309 Allergic rhinitis, unspecified: Secondary | ICD-10-CM | POA: Diagnosis not present

## 2011-07-15 DIAGNOSIS — R633 Feeding difficulties, unspecified: Secondary | ICD-10-CM | POA: Diagnosis not present

## 2011-07-15 DIAGNOSIS — J309 Allergic rhinitis, unspecified: Secondary | ICD-10-CM | POA: Diagnosis not present

## 2011-07-15 DIAGNOSIS — R1310 Dysphagia, unspecified: Secondary | ICD-10-CM | POA: Diagnosis not present

## 2011-07-19 DIAGNOSIS — M545 Low back pain, unspecified: Secondary | ICD-10-CM | POA: Diagnosis not present

## 2011-07-19 DIAGNOSIS — J309 Allergic rhinitis, unspecified: Secondary | ICD-10-CM | POA: Diagnosis not present

## 2011-07-19 DIAGNOSIS — IMO0002 Reserved for concepts with insufficient information to code with codable children: Secondary | ICD-10-CM | POA: Diagnosis not present

## 2011-07-22 DIAGNOSIS — J309 Allergic rhinitis, unspecified: Secondary | ICD-10-CM | POA: Diagnosis not present

## 2011-07-22 DIAGNOSIS — F411 Generalized anxiety disorder: Secondary | ICD-10-CM | POA: Diagnosis not present

## 2011-07-26 DIAGNOSIS — J309 Allergic rhinitis, unspecified: Secondary | ICD-10-CM | POA: Diagnosis not present

## 2011-07-28 ENCOUNTER — Other Ambulatory Visit: Payer: Self-pay | Admitting: Neurosurgery

## 2011-07-28 DIAGNOSIS — J309 Allergic rhinitis, unspecified: Secondary | ICD-10-CM | POA: Diagnosis not present

## 2011-07-28 DIAGNOSIS — M549 Dorsalgia, unspecified: Secondary | ICD-10-CM

## 2011-08-03 ENCOUNTER — Ambulatory Visit
Admission: RE | Admit: 2011-08-03 | Discharge: 2011-08-03 | Disposition: A | Discharge status: Home / Self Care | Payer: Medicare Other | Source: Ambulatory Visit | Attending: Neurosurgery | Admitting: Neurosurgery

## 2011-08-03 DIAGNOSIS — R209 Unspecified disturbances of skin sensation: Secondary | ICD-10-CM | POA: Diagnosis not present

## 2011-08-03 DIAGNOSIS — R5381 Other malaise: Secondary | ICD-10-CM | POA: Diagnosis not present

## 2011-08-03 DIAGNOSIS — M549 Dorsalgia, unspecified: Secondary | ICD-10-CM

## 2011-08-03 DIAGNOSIS — M5380 Other specified dorsopathies, site unspecified: Secondary | ICD-10-CM | POA: Diagnosis not present

## 2011-08-03 DIAGNOSIS — M47817 Spondylosis without myelopathy or radiculopathy, lumbosacral region: Secondary | ICD-10-CM | POA: Diagnosis not present

## 2011-08-03 DIAGNOSIS — R5383 Other fatigue: Secondary | ICD-10-CM | POA: Diagnosis not present

## 2011-08-05 DIAGNOSIS — F411 Generalized anxiety disorder: Secondary | ICD-10-CM | POA: Diagnosis not present

## 2011-08-06 DIAGNOSIS — J309 Allergic rhinitis, unspecified: Secondary | ICD-10-CM | POA: Diagnosis not present

## 2011-08-10 DIAGNOSIS — J309 Allergic rhinitis, unspecified: Secondary | ICD-10-CM | POA: Diagnosis not present

## 2011-08-13 DIAGNOSIS — J309 Allergic rhinitis, unspecified: Secondary | ICD-10-CM | POA: Diagnosis not present

## 2011-08-13 DIAGNOSIS — J3089 Other allergic rhinitis: Secondary | ICD-10-CM | POA: Diagnosis not present

## 2011-08-13 DIAGNOSIS — J45909 Unspecified asthma, uncomplicated: Secondary | ICD-10-CM | POA: Diagnosis not present

## 2011-08-13 DIAGNOSIS — H1045 Other chronic allergic conjunctivitis: Secondary | ICD-10-CM | POA: Diagnosis not present

## 2011-08-16 DIAGNOSIS — J309 Allergic rhinitis, unspecified: Secondary | ICD-10-CM | POA: Diagnosis not present

## 2011-08-19 DIAGNOSIS — F411 Generalized anxiety disorder: Secondary | ICD-10-CM | POA: Diagnosis not present

## 2011-08-19 DIAGNOSIS — J309 Allergic rhinitis, unspecified: Secondary | ICD-10-CM | POA: Diagnosis not present

## 2011-09-02 DIAGNOSIS — F411 Generalized anxiety disorder: Secondary | ICD-10-CM | POA: Diagnosis not present

## 2011-09-02 DIAGNOSIS — J309 Allergic rhinitis, unspecified: Secondary | ICD-10-CM | POA: Diagnosis not present

## 2011-09-16 DIAGNOSIS — F411 Generalized anxiety disorder: Secondary | ICD-10-CM | POA: Diagnosis not present

## 2011-09-23 DIAGNOSIS — J309 Allergic rhinitis, unspecified: Secondary | ICD-10-CM | POA: Diagnosis not present

## 2011-10-04 DIAGNOSIS — J309 Allergic rhinitis, unspecified: Secondary | ICD-10-CM | POA: Diagnosis not present

## 2011-10-04 DIAGNOSIS — F411 Generalized anxiety disorder: Secondary | ICD-10-CM | POA: Diagnosis not present

## 2011-10-06 DIAGNOSIS — J309 Allergic rhinitis, unspecified: Secondary | ICD-10-CM | POA: Diagnosis not present

## 2011-10-08 DIAGNOSIS — J309 Allergic rhinitis, unspecified: Secondary | ICD-10-CM | POA: Diagnosis not present

## 2011-10-11 DIAGNOSIS — J309 Allergic rhinitis, unspecified: Secondary | ICD-10-CM | POA: Diagnosis not present

## 2011-10-13 DIAGNOSIS — M7989 Other specified soft tissue disorders: Secondary | ICD-10-CM | POA: Diagnosis not present

## 2011-10-13 DIAGNOSIS — J45909 Unspecified asthma, uncomplicated: Secondary | ICD-10-CM | POA: Diagnosis not present

## 2011-10-18 DIAGNOSIS — J309 Allergic rhinitis, unspecified: Secondary | ICD-10-CM | POA: Diagnosis not present

## 2011-10-20 DIAGNOSIS — F411 Generalized anxiety disorder: Secondary | ICD-10-CM | POA: Diagnosis not present

## 2011-10-20 DIAGNOSIS — J309 Allergic rhinitis, unspecified: Secondary | ICD-10-CM | POA: Diagnosis not present

## 2011-10-25 DIAGNOSIS — J309 Allergic rhinitis, unspecified: Secondary | ICD-10-CM | POA: Diagnosis not present

## 2011-11-01 ENCOUNTER — Other Ambulatory Visit: Payer: Self-pay | Admitting: Family Medicine

## 2011-11-01 DIAGNOSIS — Z1231 Encounter for screening mammogram for malignant neoplasm of breast: Secondary | ICD-10-CM

## 2011-11-03 DIAGNOSIS — F411 Generalized anxiety disorder: Secondary | ICD-10-CM | POA: Diagnosis not present

## 2011-11-05 DIAGNOSIS — J309 Allergic rhinitis, unspecified: Secondary | ICD-10-CM | POA: Diagnosis not present

## 2011-11-12 DIAGNOSIS — J309 Allergic rhinitis, unspecified: Secondary | ICD-10-CM | POA: Diagnosis not present

## 2011-11-15 DIAGNOSIS — J309 Allergic rhinitis, unspecified: Secondary | ICD-10-CM | POA: Diagnosis not present

## 2011-11-22 DIAGNOSIS — J309 Allergic rhinitis, unspecified: Secondary | ICD-10-CM | POA: Diagnosis not present

## 2011-11-24 DIAGNOSIS — F411 Generalized anxiety disorder: Secondary | ICD-10-CM | POA: Diagnosis not present

## 2011-11-25 ENCOUNTER — Ambulatory Visit
Admission: RE | Admit: 2011-11-25 | Discharge: 2011-11-25 | Disposition: A | Discharge status: Home / Self Care | Payer: Medicare Other | Source: Ambulatory Visit | Attending: Family Medicine | Admitting: Family Medicine

## 2011-11-25 DIAGNOSIS — Z1231 Encounter for screening mammogram for malignant neoplasm of breast: Secondary | ICD-10-CM | POA: Diagnosis not present

## 2011-12-01 DIAGNOSIS — J309 Allergic rhinitis, unspecified: Secondary | ICD-10-CM | POA: Diagnosis not present

## 2011-12-01 DIAGNOSIS — H1045 Other chronic allergic conjunctivitis: Secondary | ICD-10-CM | POA: Diagnosis not present

## 2011-12-02 DIAGNOSIS — Z01419 Encounter for gynecological examination (general) (routine) without abnormal findings: Secondary | ICD-10-CM | POA: Diagnosis not present

## 2011-12-02 DIAGNOSIS — Z124 Encounter for screening for malignant neoplasm of cervix: Secondary | ICD-10-CM | POA: Diagnosis not present

## 2011-12-02 DIAGNOSIS — L708 Other acne: Secondary | ICD-10-CM | POA: Diagnosis not present

## 2011-12-02 DIAGNOSIS — Z0000 Encounter for general adult medical examination without abnormal findings: Secondary | ICD-10-CM | POA: Diagnosis not present

## 2011-12-02 DIAGNOSIS — L680 Hirsutism: Secondary | ICD-10-CM | POA: Diagnosis not present

## 2011-12-02 DIAGNOSIS — Z113 Encounter for screening for infections with a predominantly sexual mode of transmission: Secondary | ICD-10-CM | POA: Diagnosis not present

## 2011-12-17 DIAGNOSIS — J309 Allergic rhinitis, unspecified: Secondary | ICD-10-CM | POA: Diagnosis not present

## 2011-12-20 DIAGNOSIS — H33329 Round hole, unspecified eye: Secondary | ICD-10-CM | POA: Diagnosis not present

## 2011-12-22 DIAGNOSIS — J309 Allergic rhinitis, unspecified: Secondary | ICD-10-CM | POA: Diagnosis not present

## 2011-12-24 DIAGNOSIS — J309 Allergic rhinitis, unspecified: Secondary | ICD-10-CM | POA: Diagnosis not present

## 2012-01-03 ENCOUNTER — Encounter (INDEPENDENT_AMBULATORY_CARE_PROVIDER_SITE_OTHER): Payer: Medicare Other | Admitting: Ophthalmology

## 2012-01-03 DIAGNOSIS — H43819 Vitreous degeneration, unspecified eye: Secondary | ICD-10-CM | POA: Diagnosis not present

## 2012-01-03 DIAGNOSIS — H33309 Unspecified retinal break, unspecified eye: Secondary | ICD-10-CM | POA: Diagnosis not present

## 2012-01-05 DIAGNOSIS — F411 Generalized anxiety disorder: Secondary | ICD-10-CM | POA: Diagnosis not present

## 2012-01-10 ENCOUNTER — Ambulatory Visit (INDEPENDENT_AMBULATORY_CARE_PROVIDER_SITE_OTHER): Payer: Medicare Other | Admitting: Ophthalmology

## 2012-01-10 DIAGNOSIS — H33309 Unspecified retinal break, unspecified eye: Secondary | ICD-10-CM

## 2012-01-20 DIAGNOSIS — F411 Generalized anxiety disorder: Secondary | ICD-10-CM | POA: Diagnosis not present

## 2012-02-09 DIAGNOSIS — F411 Generalized anxiety disorder: Secondary | ICD-10-CM | POA: Diagnosis not present

## 2012-02-16 DIAGNOSIS — IMO0002 Reserved for concepts with insufficient information to code with codable children: Secondary | ICD-10-CM | POA: Diagnosis not present

## 2012-03-06 DIAGNOSIS — F411 Generalized anxiety disorder: Secondary | ICD-10-CM | POA: Diagnosis not present

## 2012-03-10 DIAGNOSIS — J069 Acute upper respiratory infection, unspecified: Secondary | ICD-10-CM | POA: Diagnosis not present

## 2012-03-12 ENCOUNTER — Emergency Department: Payer: Self-pay | Admitting: Internal Medicine

## 2012-03-12 DIAGNOSIS — J209 Acute bronchitis, unspecified: Secondary | ICD-10-CM | POA: Diagnosis not present

## 2012-03-12 DIAGNOSIS — R059 Cough, unspecified: Secondary | ICD-10-CM | POA: Diagnosis not present

## 2012-03-12 DIAGNOSIS — R1110 Vomiting, unspecified: Secondary | ICD-10-CM | POA: Diagnosis not present

## 2012-03-12 DIAGNOSIS — R05 Cough: Secondary | ICD-10-CM | POA: Diagnosis not present

## 2012-03-12 DIAGNOSIS — R112 Nausea with vomiting, unspecified: Secondary | ICD-10-CM | POA: Diagnosis not present

## 2012-03-12 DIAGNOSIS — R0989 Other specified symptoms and signs involving the circulatory and respiratory systems: Secondary | ICD-10-CM | POA: Diagnosis not present

## 2012-03-13 DIAGNOSIS — R05 Cough: Secondary | ICD-10-CM | POA: Diagnosis not present

## 2012-03-13 DIAGNOSIS — R059 Cough, unspecified: Secondary | ICD-10-CM | POA: Diagnosis not present

## 2012-03-13 DIAGNOSIS — R509 Fever, unspecified: Secondary | ICD-10-CM | POA: Diagnosis not present

## 2012-03-27 DIAGNOSIS — J309 Allergic rhinitis, unspecified: Secondary | ICD-10-CM | POA: Diagnosis not present

## 2012-03-31 DIAGNOSIS — S139XXA Sprain of joints and ligaments of unspecified parts of neck, initial encounter: Secondary | ICD-10-CM | POA: Diagnosis not present

## 2012-03-31 DIAGNOSIS — S199XXA Unspecified injury of neck, initial encounter: Secondary | ICD-10-CM | POA: Diagnosis not present

## 2012-03-31 DIAGNOSIS — J309 Allergic rhinitis, unspecified: Secondary | ICD-10-CM | POA: Diagnosis not present

## 2012-03-31 DIAGNOSIS — S0993XA Unspecified injury of face, initial encounter: Secondary | ICD-10-CM | POA: Diagnosis not present

## 2012-04-05 DIAGNOSIS — J45909 Unspecified asthma, uncomplicated: Secondary | ICD-10-CM | POA: Diagnosis not present

## 2012-04-10 DIAGNOSIS — J309 Allergic rhinitis, unspecified: Secondary | ICD-10-CM | POA: Diagnosis not present

## 2012-04-13 DIAGNOSIS — J309 Allergic rhinitis, unspecified: Secondary | ICD-10-CM | POA: Diagnosis not present

## 2012-04-24 DIAGNOSIS — J309 Allergic rhinitis, unspecified: Secondary | ICD-10-CM | POA: Diagnosis not present

## 2012-04-24 DIAGNOSIS — F411 Generalized anxiety disorder: Secondary | ICD-10-CM | POA: Diagnosis not present

## 2012-05-04 DIAGNOSIS — J309 Allergic rhinitis, unspecified: Secondary | ICD-10-CM | POA: Diagnosis not present

## 2012-05-08 DIAGNOSIS — J309 Allergic rhinitis, unspecified: Secondary | ICD-10-CM | POA: Diagnosis not present

## 2012-05-15 ENCOUNTER — Ambulatory Visit (INDEPENDENT_AMBULATORY_CARE_PROVIDER_SITE_OTHER): Payer: Medicare Other | Admitting: Ophthalmology

## 2012-05-15 DIAGNOSIS — H33309 Unspecified retinal break, unspecified eye: Secondary | ICD-10-CM

## 2012-05-15 DIAGNOSIS — H43819 Vitreous degeneration, unspecified eye: Secondary | ICD-10-CM | POA: Diagnosis not present

## 2012-05-15 DIAGNOSIS — H3540 Unspecified peripheral retinal degeneration: Secondary | ICD-10-CM | POA: Diagnosis not present

## 2012-05-15 DIAGNOSIS — H2510 Age-related nuclear cataract, unspecified eye: Secondary | ICD-10-CM | POA: Diagnosis not present

## 2012-05-17 DIAGNOSIS — R635 Abnormal weight gain: Secondary | ICD-10-CM | POA: Diagnosis not present

## 2012-05-17 DIAGNOSIS — M542 Cervicalgia: Secondary | ICD-10-CM | POA: Diagnosis not present

## 2012-05-17 DIAGNOSIS — R51 Headache: Secondary | ICD-10-CM | POA: Diagnosis not present

## 2012-05-17 DIAGNOSIS — J309 Allergic rhinitis, unspecified: Secondary | ICD-10-CM | POA: Diagnosis not present

## 2012-05-19 DIAGNOSIS — J309 Allergic rhinitis, unspecified: Secondary | ICD-10-CM | POA: Diagnosis not present

## 2012-05-22 DIAGNOSIS — J309 Allergic rhinitis, unspecified: Secondary | ICD-10-CM | POA: Diagnosis not present

## 2012-05-25 DIAGNOSIS — J309 Allergic rhinitis, unspecified: Secondary | ICD-10-CM | POA: Diagnosis not present

## 2012-05-31 DIAGNOSIS — J309 Allergic rhinitis, unspecified: Secondary | ICD-10-CM | POA: Diagnosis not present

## 2012-06-05 DIAGNOSIS — J309 Allergic rhinitis, unspecified: Secondary | ICD-10-CM | POA: Diagnosis not present

## 2012-06-05 DIAGNOSIS — F411 Generalized anxiety disorder: Secondary | ICD-10-CM | POA: Diagnosis not present

## 2012-06-07 DIAGNOSIS — J309 Allergic rhinitis, unspecified: Secondary | ICD-10-CM | POA: Diagnosis not present

## 2012-06-14 DIAGNOSIS — IMO0002 Reserved for concepts with insufficient information to code with codable children: Secondary | ICD-10-CM | POA: Diagnosis not present

## 2012-06-21 DIAGNOSIS — J309 Allergic rhinitis, unspecified: Secondary | ICD-10-CM | POA: Diagnosis not present

## 2012-06-22 DIAGNOSIS — H0289 Other specified disorders of eyelid: Secondary | ICD-10-CM | POA: Diagnosis not present

## 2012-06-22 DIAGNOSIS — H0270 Unspecified degenerative disorders of eyelid and periocular area: Secondary | ICD-10-CM | POA: Diagnosis not present

## 2012-06-23 DIAGNOSIS — J309 Allergic rhinitis, unspecified: Secondary | ICD-10-CM | POA: Diagnosis not present

## 2012-06-27 DIAGNOSIS — J309 Allergic rhinitis, unspecified: Secondary | ICD-10-CM | POA: Diagnosis not present

## 2012-07-03 DIAGNOSIS — J309 Allergic rhinitis, unspecified: Secondary | ICD-10-CM | POA: Diagnosis not present

## 2012-07-05 DIAGNOSIS — J309 Allergic rhinitis, unspecified: Secondary | ICD-10-CM | POA: Diagnosis not present

## 2012-07-19 DIAGNOSIS — J309 Allergic rhinitis, unspecified: Secondary | ICD-10-CM | POA: Diagnosis not present

## 2012-07-25 DIAGNOSIS — J309 Allergic rhinitis, unspecified: Secondary | ICD-10-CM | POA: Diagnosis not present

## 2012-08-03 DIAGNOSIS — J309 Allergic rhinitis, unspecified: Secondary | ICD-10-CM | POA: Diagnosis not present

## 2012-08-08 DIAGNOSIS — IMO0002 Reserved for concepts with insufficient information to code with codable children: Secondary | ICD-10-CM | POA: Diagnosis not present

## 2012-08-11 DIAGNOSIS — H1045 Other chronic allergic conjunctivitis: Secondary | ICD-10-CM | POA: Diagnosis not present

## 2012-08-11 DIAGNOSIS — J309 Allergic rhinitis, unspecified: Secondary | ICD-10-CM | POA: Diagnosis not present

## 2012-08-11 DIAGNOSIS — J3089 Other allergic rhinitis: Secondary | ICD-10-CM | POA: Diagnosis not present

## 2012-08-11 DIAGNOSIS — J45909 Unspecified asthma, uncomplicated: Secondary | ICD-10-CM | POA: Diagnosis not present

## 2012-08-14 ENCOUNTER — Ambulatory Visit (INDEPENDENT_AMBULATORY_CARE_PROVIDER_SITE_OTHER): Payer: Medicare Other | Admitting: Nurse Practitioner

## 2012-08-14 ENCOUNTER — Ambulatory Visit (INDEPENDENT_AMBULATORY_CARE_PROVIDER_SITE_OTHER): Payer: Medicare Other

## 2012-08-14 ENCOUNTER — Encounter: Payer: Self-pay | Admitting: Nurse Practitioner

## 2012-08-14 ENCOUNTER — Telehealth: Payer: Self-pay | Admitting: Family Medicine

## 2012-08-14 VITALS — BP 120/79 | HR 60 | Temp 98.6°F | Wt 152.0 lb

## 2012-08-14 DIAGNOSIS — R52 Pain, unspecified: Secondary | ICD-10-CM | POA: Diagnosis not present

## 2012-08-14 DIAGNOSIS — K219 Gastro-esophageal reflux disease without esophagitis: Secondary | ICD-10-CM | POA: Insufficient documentation

## 2012-08-14 DIAGNOSIS — R1031 Right lower quadrant pain: Secondary | ICD-10-CM | POA: Diagnosis not present

## 2012-08-14 DIAGNOSIS — J45909 Unspecified asthma, uncomplicated: Secondary | ICD-10-CM

## 2012-08-14 DIAGNOSIS — K5900 Constipation, unspecified: Secondary | ICD-10-CM

## 2012-08-14 DIAGNOSIS — G600 Hereditary motor and sensory neuropathy: Secondary | ICD-10-CM | POA: Insufficient documentation

## 2012-08-14 HISTORY — DX: Unspecified asthma, uncomplicated: J45.909

## 2012-08-14 HISTORY — DX: Constipation, unspecified: K59.00

## 2012-08-14 LAB — POCT CBC
Granulocyte percent: 51.1 %G (ref 37–80)
HCT, POC: 40.2 % (ref 37.7–47.9)
Hemoglobin: 13 g/dL (ref 12.2–16.2)
Lymph, poc: 2.4 (ref 0.6–3.4)
MCH, POC: 25.5 pg — AB (ref 27–31.2)
MCHC: 32.4 g/dL (ref 31.8–35.4)
MCV: 78.5 fL — AB (ref 80–97)
MPV: 7.3 fL (ref 0–99.8)
POC Granulocyte: 2.7 (ref 2–6.9)
POC LYMPH PERCENT: 44.9 %L (ref 10–50)
Platelet Count, POC: 352 10*3/uL (ref 142–424)
RBC: 5.1 M/uL (ref 4.04–5.48)
RDW, POC: 13.5 %
WBC: 5.3 10*3/uL (ref 4.6–10.2)

## 2012-08-14 LAB — COMPLETE METABOLIC PANEL WITH GFR
ALT: 18 U/L (ref 0–35)
AST: 22 U/L (ref 0–37)
Albumin: 4.4 g/dL (ref 3.5–5.2)
Alkaline Phosphatase: 60 U/L (ref 39–117)
BUN: 12 mg/dL (ref 6–23)
CO2: 28 mEq/L (ref 19–32)
Calcium: 9.4 mg/dL (ref 8.4–10.5)
Chloride: 103 mEq/L (ref 96–112)
Creat: 0.74 mg/dL (ref 0.50–1.10)
GFR, Est African American: >89 mL/min
GFR, Est Non African American: >89 mL/min
Glucose, Bld: 83 mg/dL (ref 70–99)
Potassium: 4.2 mEq/L (ref 3.5–5.3)
Sodium: 138 mEq/L (ref 135–145)
Total Bilirubin: 0.2 mg/dL — ABNORMAL LOW (ref 0.3–1.2)
Total Protein: 7 g/dL (ref 6.0–8.3)

## 2012-08-14 MED ORDER — LINACLOTIDE 145 MCG PO CAPS: 145.0000 ug | ORAL_CAPSULE | Freq: Every day | ORAL | Status: DC

## 2012-08-14 NOTE — Patient Instructions (Signed)

## 2012-08-14 NOTE — Telephone Encounter (Signed)
APPT MADE

## 2012-08-14 NOTE — Progress Notes (Signed)
  Subjective:    Patient ID: Brandi Erickson, female    DOB: November 29, 1970, 42 y.o.   MRN: [id number]  HPI - Patientin C/O right flank pain- started Sunday afternoon. Still hurting- Rates pain 6/10 constant-Nothing makes it worse- Nothing helping.    Review of Systems  Constitutional: Negative for fever and chills.  Respiratory: Negative.   Cardiovascular: Negative.   Gastrointestinal: Positive for nausea and constipation. Negative for vomiting and diarrhea.  Genitourinary: Negative.   Musculoskeletal: Negative.   Skin: Negative.        Objective:   Physical Exam  Constitutional: She appears well-developed and well-nourished.  Cardiovascular: Normal rate and normal heart sounds.   Pulmonary/Chest: Effort normal and breath sounds normal.  Abdominal: Soft. Bowel sounds are normal. There is tenderness (right lower quadrant). There is rebound. There is no guarding.  Skin: Skin is warm.  Psychiatric: She has a normal mood and affect. Her behavior is normal. Judgment and thought content normal.   BP 120/79  Pulse 60  Temp(Src) 98.6 F (37 C) (Oral)  Wt 152 lb (68.947 kg)  BMI 24.55 kg/m2  LMP 07/29/2012   Results for orders placed in visit on 08/14/12  POCT CBC      Result Value Range   WBC 5.3  4.6 - 10.2 K/uL   Lymph, poc 2.4  0.6 - 3.4   POC LYMPH PERCENT 44.9  10 - 50 %L   POC Granulocyte 2.7  2 - 6.9   Granulocyte percent 51.1  37 - 80 %G   RBC 5.1  4.04 - 5.48 M/uL   Hemoglobin 13.0  12.2 - 16.2 g/dL   HCT, POC 40.9  81.1 - 47.9 %   MCV 78.5 (*) 80 - 97 fL   MCH, POC 25.5 (*) 27 - 31.2 pg   MCHC 32.4  31.8 - 35.4 g/dL   RDW, POC 91.4     Platelet Count, POC 352.0  142 - 424 K/uL   MPV 7.3  0 - 99.8 fL   KUB- Moderate amount stool right colon- Preliminary reading by Paulene Floor, FNP  [facility]       Assessment & Plan:  1. Abdominal pain, acute, right lower quadrant Force fluids - POCT CBC - COMPLETE METABOLIC PANEL WITH GFR - DG Abd 1 View; Future  2.  Constipation Force fluids Increase fiber in diet   - Linaclotide (LINZESS) 145 MCG CAPS; Take 1 capsule (145 mcg total) by mouth daily.  Dispense: 30 capsule; Refill: 3  Mary-Margaret Daphine Deutscher, FNP

## 2012-08-25 DIAGNOSIS — J309 Allergic rhinitis, unspecified: Secondary | ICD-10-CM | POA: Diagnosis not present

## 2012-08-28 DIAGNOSIS — J309 Allergic rhinitis, unspecified: Secondary | ICD-10-CM | POA: Diagnosis not present

## 2012-09-05 ENCOUNTER — Telehealth: Payer: Self-pay

## 2012-09-05 DIAGNOSIS — Z1211 Encounter for screening for malignant neoplasm of colon: Secondary | ICD-10-CM

## 2012-09-05 NOTE — Telephone Encounter (Signed)
Referral made 

## 2012-09-05 NOTE — Telephone Encounter (Signed)
Pt aware referral to be set up

## 2012-09-05 NOTE — Telephone Encounter (Signed)
Pt wants a referral for a colonoscopy

## 2012-09-06 DIAGNOSIS — J309 Allergic rhinitis, unspecified: Secondary | ICD-10-CM | POA: Diagnosis not present

## 2012-09-12 DIAGNOSIS — J309 Allergic rhinitis, unspecified: Secondary | ICD-10-CM | POA: Diagnosis not present

## 2012-09-18 DIAGNOSIS — J309 Allergic rhinitis, unspecified: Secondary | ICD-10-CM | POA: Diagnosis not present

## 2012-09-27 DIAGNOSIS — J309 Allergic rhinitis, unspecified: Secondary | ICD-10-CM | POA: Diagnosis not present

## 2012-10-09 DIAGNOSIS — J309 Allergic rhinitis, unspecified: Secondary | ICD-10-CM | POA: Diagnosis not present

## 2012-10-11 ENCOUNTER — Encounter (INDEPENDENT_AMBULATORY_CARE_PROVIDER_SITE_OTHER): Payer: Self-pay | Admitting: Internal Medicine

## 2012-10-11 ENCOUNTER — Telehealth (INDEPENDENT_AMBULATORY_CARE_PROVIDER_SITE_OTHER): Payer: Self-pay | Admitting: *Deleted

## 2012-10-11 ENCOUNTER — Ambulatory Visit (INDEPENDENT_AMBULATORY_CARE_PROVIDER_SITE_OTHER): Payer: Medicare Other | Admitting: Internal Medicine

## 2012-10-11 ENCOUNTER — Other Ambulatory Visit (INDEPENDENT_AMBULATORY_CARE_PROVIDER_SITE_OTHER): Payer: Self-pay | Admitting: *Deleted

## 2012-10-11 VITALS — BP 108/84 | HR 60 | Temp 98.6°F | Ht 66.0 in | Wt 159.9 lb

## 2012-10-11 DIAGNOSIS — K5900 Constipation, unspecified: Secondary | ICD-10-CM | POA: Diagnosis not present

## 2012-10-11 DIAGNOSIS — R1031 Right lower quadrant pain: Secondary | ICD-10-CM | POA: Diagnosis not present

## 2012-10-11 DIAGNOSIS — Z1211 Encounter for screening for malignant neoplasm of colon: Secondary | ICD-10-CM

## 2012-10-11 DIAGNOSIS — J309 Allergic rhinitis, unspecified: Secondary | ICD-10-CM | POA: Diagnosis not present

## 2012-10-11 DIAGNOSIS — R52 Pain, unspecified: Secondary | ICD-10-CM

## 2012-10-11 DIAGNOSIS — G8929 Other chronic pain: Secondary | ICD-10-CM | POA: Diagnosis not present

## 2012-10-11 MED ORDER — PEG-KCL-NACL-NASULF-NA ASC-C 100 G PO SOLR: 1.0000 | Freq: Once | ORAL | Status: DC

## 2012-10-11 NOTE — Progress Notes (Signed)
Subjective:     Patient ID: Brandi Erickson, female   DOB: 1970-06-03, 42 y.o.   MRN: [id number]  HPI Referred to our office  Paulene Floor NP at [facility] for a screening colonoscopy. She tells me she has been having rt sided lower abdominal pain.  She tells me she has chronic constipation for years. She usually has a BM once a week or once every 3 days.  Appetite is good. No weight loss. She has actually gained weight. Stools are brown in color. No melena or bright red rectal bleeding No family hx of colon cancer. Linzess helps with her BM. She takes the Oxcontin on a daily basis.  She was seen at [facility] x 2 yrs for same. No colonoscopy. She was placed on Miralax which did not help.  08/14/2012 Abdominal xray:  Comparison: None.  Findings: Supine abdomen shows no gaseous bowel dilatation. Stool  is seen in the right and left colon. There are numerous  phleboliths over the anatomic pelvis.  IMPRESSION:  Nonspecific bowel gas pattern.     Review of Systems see hpi Current Outpatient Prescriptions  Medication Sig Dispense Refill  . celecoxib (CELEBREX) 200 MG capsule Take 200 mg by mouth 2 (two) times daily.      . Fluticasone-Salmeterol (ADVAIR DISKUS) 250-50 MCG/DOSE AEPB Inhale 1 puff into the lungs every 12 (twelve) hours.      . gabapentin (NEURONTIN) 800 MG tablet Take 800 mg by mouth 3 (three) times daily.      . Linaclotide (LINZESS) 145 MCG CAPS Take 1 capsule (145 mcg total) by mouth daily.  30 capsule  3  . montelukast (SINGULAIR) 10 MG tablet Take 10 mg by mouth at bedtime.      . Olopatadine HCl (PATADAY) 0.2 % SOLN Apply to eye.      Marland Kitchen oxyCODONE (OXYCONTIN) 20 MG 12 hr tablet Take 20 mg by mouth every 12 (twelve) hours.      . pantoprazole (PROTONIX) 40 MG tablet Take 40 mg by mouth daily.       No current facility-administered medications for this visit.   Past Medical History  Diagnosis Date  . Asthma   . Allergy   . CMT  (Charcot-Marie-Tooth disease)    Past Surgical History  Procedure Laterality Date  . Esophageal manometry  05/17/2011    Procedure: ESOPHAGEAL MANOMETRY (EM);  Surgeon: Theda Belfast, MD;  Location: [facility];  Service: Endoscopy;  Laterality: N/A;  \ Allergies  Allergen Reactions  . Penicillins   . Sulfa Antibiotics         Objective:   Physical Exam  Filed Vitals:   10/11/12 1528  BP: 108/84  Pulse: 60  Temp: 98.6 F (37 C)  Height: 5\' 6"  (1.676 m)  Weight: 159 lb 14.4 oz (72.53 kg)   Alert and oriented. Skin warm and dry. Oral mucosa is moist.   . Sclera anicteric, conjunctivae is pink. Thyroid not enlarged. No cervical lymphadenopathy. Lungs clear. Heart regular rate and rhythm.  Abdomen is soft. Bowel sounds are positive. No hepatomegaly. No abdominal masses felt. No tenderness.  No edema to lower extremities.  No stool, guaiac negative     Assessment:    Constipation, chronic.  Rt lower abdominal pain which is chronic also. In need of screening colonoscopy    Plan:    Colonoscopy. The risks and benefits such as perforation, bleeding, and infection were reviewed with the patient and is agreeable. Marland Kitchen

## 2012-10-11 NOTE — Patient Instructions (Addendum)
Colonoscopy with Dr. Rehman. The risks and benefits such as perforation, bleeding, and infection were reviewed with the patient and is agreeable. 

## 2012-10-11 NOTE — Telephone Encounter (Signed)
Patient needs movi prep 

## 2012-10-12 DIAGNOSIS — J309 Allergic rhinitis, unspecified: Secondary | ICD-10-CM | POA: Diagnosis not present

## 2012-10-18 DIAGNOSIS — J309 Allergic rhinitis, unspecified: Secondary | ICD-10-CM | POA: Diagnosis not present

## 2012-10-19 DIAGNOSIS — G600 Hereditary motor and sensory neuropathy: Secondary | ICD-10-CM | POA: Diagnosis not present

## 2012-10-19 DIAGNOSIS — K5900 Constipation, unspecified: Secondary | ICD-10-CM | POA: Diagnosis not present

## 2012-10-24 DIAGNOSIS — J309 Allergic rhinitis, unspecified: Secondary | ICD-10-CM | POA: Diagnosis not present

## 2012-10-27 DIAGNOSIS — IMO0002 Reserved for concepts with insufficient information to code with codable children: Secondary | ICD-10-CM | POA: Diagnosis not present

## 2012-11-01 ENCOUNTER — Ambulatory Visit (HOSPITAL_COMMUNITY)
Admission: RE | Admit: 2012-11-01 | Discharge: 2012-11-01 | Disposition: A | Discharge status: Home / Self Care | Payer: Medicare Other | Source: Ambulatory Visit | Attending: Internal Medicine | Admitting: Internal Medicine

## 2012-11-01 ENCOUNTER — Encounter (HOSPITAL_COMMUNITY)
Admission: RE | Disposition: A | Discharge status: Home / Self Care | Payer: Self-pay | Source: Ambulatory Visit | Attending: Internal Medicine

## 2012-11-01 ENCOUNTER — Encounter (HOSPITAL_COMMUNITY): Payer: Self-pay | Admitting: *Deleted

## 2012-11-01 DIAGNOSIS — K644 Residual hemorrhoidal skin tags: Secondary | ICD-10-CM | POA: Diagnosis not present

## 2012-11-01 DIAGNOSIS — R109 Unspecified abdominal pain: Secondary | ICD-10-CM | POA: Insufficient documentation

## 2012-11-01 DIAGNOSIS — K5900 Constipation, unspecified: Secondary | ICD-10-CM | POA: Diagnosis not present

## 2012-11-01 DIAGNOSIS — R1031 Right lower quadrant pain: Secondary | ICD-10-CM | POA: Diagnosis not present

## 2012-11-01 HISTORY — PX: COLONOSCOPY: SHX5424

## 2012-11-01 SURGERY — COLONOSCOPY
Anesthesia: Moderate Sedation

## 2012-11-01 MED ORDER — MEPERIDINE HCL 50 MG/ML IJ SOLN
INTRAMUSCULAR | Status: DC | PRN
  Administered 2012-11-01 (×2): 25 mg via INTRAVENOUS

## 2012-11-01 MED ORDER — MIDAZOLAM HCL 5 MG/5ML IJ SOLN
INTRAMUSCULAR | Status: AC
  Filled 2012-11-01: qty 10

## 2012-11-01 MED ORDER — HYOSCYAMINE SULFATE 0.125 MG SL SUBL
0.1250 mg | SUBLINGUAL_TABLET | Freq: Three times a day (TID) | SUBLINGUAL | Status: DC | PRN
Start: 2012-11-01 — End: 2015-07-23

## 2012-11-01 MED ORDER — STERILE WATER FOR IRRIGATION IR SOLN
Status: DC | PRN
  Administered 2012-11-01: 14:00:00

## 2012-11-01 MED ORDER — SODIUM CHLORIDE 0.9 % IV SOLN
INTRAVENOUS | Status: DC
  Administered 2012-11-01: 1000 mL via INTRAVENOUS

## 2012-11-01 MED ORDER — MIDAZOLAM HCL 5 MG/5ML IJ SOLN
INTRAMUSCULAR | Status: DC | PRN
  Administered 2012-11-01 (×3): 2 mg via INTRAVENOUS

## 2012-11-01 MED ORDER — LINACLOTIDE 145 MCG PO CAPS: 145.0000 ug | ORAL_CAPSULE | Freq: Every day | ORAL | Status: DC

## 2012-11-01 MED ORDER — MEPERIDINE HCL 50 MG/ML IJ SOLN
INTRAMUSCULAR | Status: AC
  Filled 2012-11-01: qty 1

## 2012-11-01 NOTE — H&P (Signed)
Brandi Erickson is an 42 y.o. female.   Chief Complaint: Patient is here for colonoscopy. HPI: Patient is 42 year old African female who presents with recurrent lower abdominal pain and constipation. She denies melena rectal bleeding anorexia or weight loss. She abdominopelvic CT the 5 years ago and was unremarkable. Gynecologic evaluation has been negative. History is negative for CRC or inflammatory bowel disease.  Past Medical History  Diagnosis Date  . Asthma   . Allergy   . CMT (Charcot-Marie-Tooth disease)     Past Surgical History  Procedure Laterality Date  . Esophageal manometry  05/17/2011    Procedure: ESOPHAGEAL MANOMETRY (EM);  Surgeon: Theda Belfast, MD;  Location: [facility];  Service: Endoscopy;  Laterality: N/A;  . Tonsillectomy    . Tubal reconstruction  1997  . Tubal ligation  1999  . Dilation and curettage of uterus      History reviewed. No pertinent family history. Social History:  reports that she has never smoked. She does not have any smokeless tobacco history on file. She reports that she does not drink alcohol or use illicit drugs.  Allergies:  Allergies  Allergen Reactions  . Penicillins   . Sulfa Antibiotics     Medications Prior to Admission  Medication Sig Dispense Refill  . celecoxib (CELEBREX) 200 MG capsule Take 200 mg by mouth 2 (two) times daily.      . Fluticasone-Salmeterol (ADVAIR DISKUS) 250-50 MCG/DOSE AEPB Inhale 1 puff into the lungs every 12 (twelve) hours.      . gabapentin (NEURONTIN) 800 MG tablet Take 800 mg by mouth 3 (three) times daily.      . Linaclotide (LINZESS) 145 MCG CAPS Take 1 capsule (145 mcg total) by mouth daily.  30 capsule  3  . montelukast (SINGULAIR) 10 MG tablet Take 10 mg by mouth at bedtime.      . Olopatadine HCl (PATADAY) 0.2 % SOLN Apply to eye.      Marland Kitchen oxyCODONE (OXYCONTIN) 20 MG 12 hr tablet Take 20 mg by mouth every 12 (twelve) hours.      . pantoprazole (PROTONIX) 40 MG tablet Take 40 mg by mouth  daily.      . peg 3350 powder (MOVIPREP) 100 G SOLR Take 1 kit (100 g total) by mouth once.  1 kit  0    No results found for this or any previous visit (from the past 48 hour(s)). No results found.  ROS  Blood pressure 119/68, pulse 61, temperature 98 F (36.7 C), temperature source Oral, resp. rate 16, height 5\' 6"  (1.676 m), weight 157 lb (71.215 kg), last menstrual period 10/17/2012, SpO2 97.00%. Physical Exam  Constitutional: She appears well-developed and well-nourished.  HENT:  Mouth/Throat: Oropharynx is clear and moist.  Eyes: Conjunctivae are normal. No scleral icterus.  Neck: No thyromegaly present.  Cardiovascular: Normal rate, regular rhythm and normal heart sounds.   No murmur heard. Respiratory: Effort normal and breath sounds normal.  GI: Soft. She exhibits no distension and no mass. Tenderness: mild tenderness across lower abdomen.  Musculoskeletal: She exhibits no edema.  Lymphadenopathy:    She has no cervical adenopathy.  Neurological: She is alert.  Skin: Skin is warm and dry.     Assessment/Plan Abdominal pain and constipation. Diagnostic colonoscopy.  Lynwood Kubisiak U 11/01/2012, 1:35 PM

## 2012-11-01 NOTE — Op Note (Signed)
COLONOSCOPY PROCEDURE REPORT  PATIENT:  ANDERSEN MCKIVER  MR#:  [id number] Birthdate:  [birth year], 42 y.o., female Endoscopist:  Dr. Malissa Hippo, MD Referred By:  Dr. Redmond Baseman, MD Procedure Date: 11/01/2012  Procedure:   Colonoscopy.  Indications:  Patient is a 42 year old African American female with recurrent lower abdominal pain and constipation. She is undergoing diagnostic colonoscopy.  Informed Consent:  The procedure and risks were reviewed with the patient and informed consent was obtained.  Medications:  Demerol 50 mg IV Versed 6 mg IV  Description of procedure:  After a digital rectal exam was performed, that colonoscope was advanced from the anus through the rectum and colon to the area of the cecum, ileocecal valve and appendiceal orifice. The cecum was deeply intubated. These structures were well-seen and photographed for the record. From the level of the cecum and ileocecal valve, the scope was slowly and cautiously withdrawn. The mucosal surfaces were carefully surveyed utilizing scope tip to flexion to facilitate fold flattening as needed. The scope was pulled down into the rectum where a thorough exam including retroflexion was performed. Terminal ileum was also examined.  Findings:   Prep excellent. Normal mucosa of terminal ileum. Normal mucosa of colon and rectum. Small hemorrhoids below the dentate line.    Therapeutic/Diagnostic Maneuvers Performed:  None  Complications:  None  Cecal Withdrawal Time:  8 minutes  Impression:  Normal terminal ileum. Small external hemorrhoids otherwise normal colonoscopy.  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  Recommendations:  Standard instructions given. Continue Linaclotide 145 mcg by mouth daily. Hyoscyamine sublingual 3 times a day when necessary. Stool diary until office visit in one month.  Erez Mccallum U  11/01/2012 2:13 PM  CC: Dr. Redmond Baseman, MD & Dr. Bonnetta Barry  ref. provider found

## 2012-11-03 ENCOUNTER — Encounter (HOSPITAL_COMMUNITY): Payer: Self-pay | Admitting: Internal Medicine

## 2012-11-06 DIAGNOSIS — J309 Allergic rhinitis, unspecified: Secondary | ICD-10-CM | POA: Diagnosis not present

## 2012-11-13 DIAGNOSIS — J309 Allergic rhinitis, unspecified: Secondary | ICD-10-CM | POA: Diagnosis not present

## 2012-11-16 DIAGNOSIS — J309 Allergic rhinitis, unspecified: Secondary | ICD-10-CM | POA: Diagnosis not present

## 2012-11-17 ENCOUNTER — Other Ambulatory Visit: Payer: Self-pay

## 2012-11-17 DIAGNOSIS — Z1231 Encounter for screening mammogram for malignant neoplasm of breast: Secondary | ICD-10-CM

## 2012-11-23 DIAGNOSIS — L819 Disorder of pigmentation, unspecified: Secondary | ICD-10-CM | POA: Diagnosis not present

## 2012-11-23 DIAGNOSIS — L259 Unspecified contact dermatitis, unspecified cause: Secondary | ICD-10-CM | POA: Diagnosis not present

## 2012-11-30 DIAGNOSIS — J309 Allergic rhinitis, unspecified: Secondary | ICD-10-CM | POA: Diagnosis not present

## 2012-12-04 ENCOUNTER — Encounter (INDEPENDENT_AMBULATORY_CARE_PROVIDER_SITE_OTHER): Payer: Self-pay | Admitting: Internal Medicine

## 2012-12-04 ENCOUNTER — Ambulatory Visit (INDEPENDENT_AMBULATORY_CARE_PROVIDER_SITE_OTHER): Payer: Medicare Other | Admitting: Internal Medicine

## 2012-12-04 VITALS — BP 100/60 | HR 64 | Temp 97.8°F | Ht 66.0 in | Wt 163.2 lb

## 2012-12-04 DIAGNOSIS — K5900 Constipation, unspecified: Secondary | ICD-10-CM

## 2012-12-04 NOTE — Progress Notes (Signed)
Subjective:     Patient ID: Brandi Erickson, female   DOB: Mar 19, 1971, 42 y.o.   MRN: [id number]  HPI Here today for f/u after recently undergoing a colonoscopy for constipation. She tells me she has had constipation for years.She tells me now she is having a BM every other day or every day. Sometimes she will have two stools in a day. 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  No melena or bright red rectal bleeding Her appetite is good no weight loss.  11/01/2012 Colonocopy: Impression:  Normal terminal ileum.  Small external hemorrhoids otherwise normal colonoscopy.  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   Review of Systems  Current Outpatient Prescriptions  Medication Sig Dispense Refill  . celecoxib (CELEBREX) 200 MG capsule Take 200 mg by mouth 2 (two) times daily.      . Fluticasone-Salmeterol (ADVAIR DISKUS) 250-50 MCG/DOSE AEPB Inhale 1 puff into the lungs every 12 (twelve) hours.      . gabapentin (NEURONTIN) 800 MG tablet Take 800 mg by mouth 3 (three) times daily.      . hyoscyamine (LEVSIN/SL) 0.125 MG SL tablet Place 1 tablet (0.125 mg total) under the tongue 3 (three) times daily as needed for cramping.  90 tablet  2  . Linaclotide (LINZESS) 145 MCG CAPS capsule Take 1 capsule (145 mcg total) by mouth daily.  30 capsule  5  . montelukast (SINGULAIR) 10 MG tablet Take 10 mg by mouth at bedtime.      . Olopatadine HCl (PATADAY) 0.2 % SOLN Apply to eye.      Marland Kitchen oxyCODONE (OXYCONTIN) 20 MG 12 hr tablet Take 20 mg by mouth every 12 (twelve) hours.      . pantoprazole (PROTONIX) 40 MG tablet Take 40 mg by mouth daily.       No current facility-administered medications for this visit.   Past Medical History  Diagnosis Date  . Asthma   . Allergy   . CMT (Charcot-Marie-Tooth disease)    Past Surgical History  Procedure Laterality Date  . Esophageal manometry  05/17/2011    Procedure: ESOPHAGEAL  MANOMETRY (EM);  Surgeon: Theda Belfast, MD;  Location: [facility];  Service: Endoscopy;  Laterality: N/A;  . Tonsillectomy    . Tubal reconstruction  1997  . Tubal ligation  1999  . Dilation and curettage of uterus    . Colonoscopy N/A 11/01/2012    Procedure: COLONOSCOPY;  Surgeon: Malissa Hippo, MD;  Location: [facility];  Service: Endoscopy;  Laterality: N/A;  130   Allergies  Allergen Reactions  . Penicillins   . Sulfa Antibiotics         Objective:   Physical Exam  Filed Vitals:   12/04/12 1113  BP: 100/60  Pulse: 64  Temp: 97.8 F (36.6 C)  Height: 5\' 6"  (1.676 m)  Weight: 163 lb 3.2 oz (74.027 kg)   Alert and oriented. Skin warm and dry. Oral mucosa is moist.   . Sclera anicteric, conjunctivae is pink. Thyroid not enlarged. No cervical lymphadenopathy. Lungs clear. Heart regular rate and rhythm.  Abdomen is soft. Bowel sounds are positive. No hepatomegaly. No abdominal masses felt. No tenderness.  No edema to lower extremities. Patient is alert and oriented.     Assessment:    constipation: Much better at this time. She is having a BM daily or every other day. Presently taking Linzess daily.  Plan:    OV in 1 yr. If any problems, please call our office.

## 2012-12-04 NOTE — Patient Instructions (Addendum)
OV in 1 yr. If any problems call our office 

## 2012-12-07 ENCOUNTER — Ambulatory Visit: Payer: Medicare Other

## 2012-12-07 DIAGNOSIS — J309 Allergic rhinitis, unspecified: Secondary | ICD-10-CM | POA: Diagnosis not present

## 2012-12-21 DIAGNOSIS — J309 Allergic rhinitis, unspecified: Secondary | ICD-10-CM | POA: Diagnosis not present

## 2012-12-22 ENCOUNTER — Ambulatory Visit
Admission: RE | Admit: 2012-12-22 | Discharge: 2012-12-22 | Disposition: A | Discharge status: Home / Self Care | Payer: Medicare Other | Source: Ambulatory Visit

## 2012-12-22 DIAGNOSIS — Z124 Encounter for screening for malignant neoplasm of cervix: Secondary | ICD-10-CM | POA: Diagnosis not present

## 2012-12-22 DIAGNOSIS — Z1231 Encounter for screening mammogram for malignant neoplasm of breast: Secondary | ICD-10-CM

## 2012-12-22 DIAGNOSIS — Z01419 Encounter for gynecological examination (general) (routine) without abnormal findings: Secondary | ICD-10-CM | POA: Diagnosis not present

## 2013-01-02 DIAGNOSIS — J309 Allergic rhinitis, unspecified: Secondary | ICD-10-CM | POA: Diagnosis not present

## 2013-01-26 DIAGNOSIS — J309 Allergic rhinitis, unspecified: Secondary | ICD-10-CM | POA: Diagnosis not present

## 2013-01-30 DIAGNOSIS — IMO0002 Reserved for concepts with insufficient information to code with codable children: Secondary | ICD-10-CM | POA: Diagnosis not present

## 2013-02-09 DIAGNOSIS — J45909 Unspecified asthma, uncomplicated: Secondary | ICD-10-CM | POA: Diagnosis not present

## 2013-02-09 DIAGNOSIS — Z6827 Body mass index (BMI) 27.0-27.9, adult: Secondary | ICD-10-CM | POA: Diagnosis not present

## 2013-02-09 DIAGNOSIS — T7840XA Allergy, unspecified, initial encounter: Secondary | ICD-10-CM | POA: Diagnosis not present

## 2013-02-09 DIAGNOSIS — Z1331 Encounter for screening for depression: Secondary | ICD-10-CM | POA: Diagnosis not present

## 2013-02-09 DIAGNOSIS — F341 Dysthymic disorder: Secondary | ICD-10-CM | POA: Diagnosis not present

## 2013-02-09 DIAGNOSIS — J209 Acute bronchitis, unspecified: Secondary | ICD-10-CM | POA: Diagnosis not present

## 2013-02-09 DIAGNOSIS — K219 Gastro-esophageal reflux disease without esophagitis: Secondary | ICD-10-CM | POA: Diagnosis not present

## 2013-02-09 DIAGNOSIS — G600 Hereditary motor and sensory neuropathy: Secondary | ICD-10-CM | POA: Diagnosis not present

## 2013-02-26 ENCOUNTER — Telehealth: Payer: Self-pay | Admitting: Family Medicine

## 2013-02-26 ENCOUNTER — Other Ambulatory Visit: Payer: Self-pay | Admitting: Family Medicine

## 2013-02-26 NOTE — Telephone Encounter (Signed)
No. Cannot do that. Needs to see her prescribing provider.

## 2013-02-27 NOTE — Telephone Encounter (Signed)
Multiple attempts to contac pt and no answer

## 2013-02-27 NOTE — Telephone Encounter (Signed)
Last seen 5/14  MMM 

## 2013-04-12 DIAGNOSIS — M79609 Pain in unspecified limb: Secondary | ICD-10-CM | POA: Diagnosis not present

## 2013-04-12 DIAGNOSIS — G600 Hereditary motor and sensory neuropathy: Secondary | ICD-10-CM | POA: Diagnosis not present

## 2013-04-19 DIAGNOSIS — J3089 Other allergic rhinitis: Secondary | ICD-10-CM | POA: Diagnosis not present

## 2013-04-19 DIAGNOSIS — H1045 Other chronic allergic conjunctivitis: Secondary | ICD-10-CM | POA: Diagnosis not present

## 2013-04-19 DIAGNOSIS — J45909 Unspecified asthma, uncomplicated: Secondary | ICD-10-CM | POA: Diagnosis not present

## 2013-04-19 DIAGNOSIS — J309 Allergic rhinitis, unspecified: Secondary | ICD-10-CM | POA: Diagnosis not present

## 2013-04-23 DIAGNOSIS — G5760 Lesion of plantar nerve, unspecified lower limb: Secondary | ICD-10-CM | POA: Diagnosis not present

## 2013-05-01 DIAGNOSIS — J309 Allergic rhinitis, unspecified: Secondary | ICD-10-CM | POA: Diagnosis not present

## 2013-05-04 DIAGNOSIS — J309 Allergic rhinitis, unspecified: Secondary | ICD-10-CM | POA: Diagnosis not present

## 2013-05-17 ENCOUNTER — Other Ambulatory Visit (HOSPITAL_COMMUNITY): Payer: Self-pay | Admitting: Obstetrics and Gynecology

## 2013-05-17 DIAGNOSIS — H524 Presbyopia: Secondary | ICD-10-CM | POA: Diagnosis not present

## 2013-05-17 DIAGNOSIS — H0289 Other specified disorders of eyelid: Secondary | ICD-10-CM | POA: Diagnosis not present

## 2013-05-17 DIAGNOSIS — Z3141 Encounter for fertility testing: Secondary | ICD-10-CM

## 2013-05-17 DIAGNOSIS — H33329 Round hole, unspecified eye: Secondary | ICD-10-CM | POA: Diagnosis not present

## 2013-05-17 DIAGNOSIS — H5210 Myopia, unspecified eye: Secondary | ICD-10-CM | POA: Diagnosis not present

## 2013-05-17 DIAGNOSIS — K219 Gastro-esophageal reflux disease without esophagitis: Secondary | ICD-10-CM | POA: Diagnosis not present

## 2013-05-21 ENCOUNTER — Ambulatory Visit (HOSPITAL_COMMUNITY)
Admission: RE | Admit: 2013-05-21 | Discharge: 2013-05-21 | Disposition: A | Discharge status: Home / Self Care | Payer: 59 | Source: Ambulatory Visit | Attending: Obstetrics and Gynecology | Admitting: Obstetrics and Gynecology

## 2013-05-21 DIAGNOSIS — Z3141 Encounter for fertility testing: Secondary | ICD-10-CM

## 2013-05-21 DIAGNOSIS — M2010 Hallux valgus (acquired), unspecified foot: Secondary | ICD-10-CM | POA: Diagnosis not present

## 2013-05-21 DIAGNOSIS — Z9851 Tubal ligation status: Secondary | ICD-10-CM | POA: Diagnosis not present

## 2013-05-21 DIAGNOSIS — G5760 Lesion of plantar nerve, unspecified lower limb: Secondary | ICD-10-CM | POA: Diagnosis not present

## 2013-05-21 DIAGNOSIS — N971 Female infertility of tubal origin: Secondary | ICD-10-CM | POA: Diagnosis not present

## 2013-05-21 MED ORDER — IOHEXOL 300 MG/ML  SOLN
20.0000 mL | Freq: Once | INTRAMUSCULAR | Status: AC | PRN
  Administered 2013-05-21: 20 mL

## 2013-05-23 DIAGNOSIS — Z6825 Body mass index (BMI) 25.0-25.9, adult: Secondary | ICD-10-CM | POA: Diagnosis not present

## 2013-05-23 DIAGNOSIS — M79609 Pain in unspecified limb: Secondary | ICD-10-CM | POA: Diagnosis not present

## 2013-05-23 DIAGNOSIS — F341 Dysthymic disorder: Secondary | ICD-10-CM | POA: Diagnosis not present

## 2013-05-23 DIAGNOSIS — D259 Leiomyoma of uterus, unspecified: Secondary | ICD-10-CM | POA: Diagnosis not present

## 2013-05-23 DIAGNOSIS — Z0000 Encounter for general adult medical examination without abnormal findings: Secondary | ICD-10-CM | POA: Diagnosis not present

## 2013-05-23 DIAGNOSIS — G600 Hereditary motor and sensory neuropathy: Secondary | ICD-10-CM | POA: Diagnosis not present

## 2013-06-05 DIAGNOSIS — M2010 Hallux valgus (acquired), unspecified foot: Secondary | ICD-10-CM | POA: Diagnosis not present

## 2013-06-05 DIAGNOSIS — G5760 Lesion of plantar nerve, unspecified lower limb: Secondary | ICD-10-CM | POA: Diagnosis not present

## 2013-06-05 DIAGNOSIS — M216X9 Other acquired deformities of unspecified foot: Secondary | ICD-10-CM | POA: Diagnosis not present

## 2013-06-05 DIAGNOSIS — G8918 Other acute postprocedural pain: Secondary | ICD-10-CM | POA: Diagnosis not present

## 2013-06-05 HISTORY — PX: FOOT NEUROMA SURGERY: SHX646

## 2013-06-15 ENCOUNTER — Encounter (HOSPITAL_BASED_OUTPATIENT_CLINIC_OR_DEPARTMENT_OTHER): Payer: Self-pay | Admitting: *Deleted

## 2013-06-19 ENCOUNTER — Encounter (HOSPITAL_BASED_OUTPATIENT_CLINIC_OR_DEPARTMENT_OTHER): Payer: Self-pay | Admitting: *Deleted

## 2013-06-19 NOTE — Progress Notes (Signed)
NPO AFTER MN. ARRIVE AT 1030. NEEDS HG AND URINE PREG.  PRE-OP ORDERS PENDING. WILL TAKE PROTONIX AND DO ADVAIR INHALER AM DOS W/ SIPS OF WATER.

## 2013-06-27 ENCOUNTER — Ambulatory Visit (HOSPITAL_BASED_OUTPATIENT_CLINIC_OR_DEPARTMENT_OTHER): Payer: 59 | Admitting: Anesthesiology

## 2013-06-27 ENCOUNTER — Encounter (HOSPITAL_BASED_OUTPATIENT_CLINIC_OR_DEPARTMENT_OTHER)
Admission: RE | Disposition: A | Discharge status: Home / Self Care | Payer: Self-pay | Source: Ambulatory Visit | Attending: Obstetrics and Gynecology

## 2013-06-27 ENCOUNTER — Encounter (HOSPITAL_BASED_OUTPATIENT_CLINIC_OR_DEPARTMENT_OTHER): Payer: 59 | Admitting: Anesthesiology

## 2013-06-27 ENCOUNTER — Encounter (HOSPITAL_BASED_OUTPATIENT_CLINIC_OR_DEPARTMENT_OTHER): Payer: Self-pay

## 2013-06-27 ENCOUNTER — Ambulatory Visit (HOSPITAL_BASED_OUTPATIENT_CLINIC_OR_DEPARTMENT_OTHER)
Admission: RE | Admit: 2013-06-27 | Discharge: 2013-06-27 | Disposition: A | Discharge status: Home / Self Care | Payer: 59 | Source: Ambulatory Visit | Attending: Obstetrics and Gynecology | Admitting: Obstetrics and Gynecology

## 2013-06-27 DIAGNOSIS — G600 Hereditary motor and sensory neuropathy: Secondary | ICD-10-CM | POA: Insufficient documentation

## 2013-06-27 DIAGNOSIS — N949 Unspecified condition associated with female genital organs and menstrual cycle: Secondary | ICD-10-CM | POA: Insufficient documentation

## 2013-06-27 DIAGNOSIS — N979 Female infertility, unspecified: Secondary | ICD-10-CM | POA: Insufficient documentation

## 2013-06-27 DIAGNOSIS — N840 Polyp of corpus uteri: Secondary | ICD-10-CM | POA: Diagnosis present

## 2013-06-27 DIAGNOSIS — K219 Gastro-esophageal reflux disease without esophagitis: Secondary | ICD-10-CM | POA: Diagnosis not present

## 2013-06-27 DIAGNOSIS — N736 Female pelvic peritoneal adhesions (postinfective): Secondary | ICD-10-CM | POA: Insufficient documentation

## 2013-06-27 HISTORY — PX: HYSTEROSCOPY W/D&C: SHX1775

## 2013-06-27 HISTORY — DX: Other seasonal allergic rhinitis: J30.2

## 2013-06-27 HISTORY — DX: Polyp of corpus uteri: N84.0

## 2013-06-27 HISTORY — PX: LAPAROSCOPY: SHX197

## 2013-06-27 HISTORY — DX: Other constipation: K59.09

## 2013-06-27 HISTORY — DX: Gastro-esophageal reflux disease without esophagitis: K21.9

## 2013-06-27 HISTORY — DX: Presence of spectacles and contact lenses: Z97.3

## 2013-06-27 HISTORY — DX: Polyneuropathy, unspecified: G62.9

## 2013-06-27 LAB — POCT HEMOGLOBIN-HEMACUE: Hemoglobin: 12.3 g/dL (ref 12.0–15.0)

## 2013-06-27 LAB — POCT PREGNANCY, URINE: Preg Test, Ur: NEGATIVE

## 2013-06-27 SURGERY — DILATATION AND CURETTAGE /HYSTEROSCOPY
Anesthesia: General | Site: Vagina

## 2013-06-27 MED ORDER — MIDAZOLAM HCL 2 MG/2ML IJ SOLN
INTRAMUSCULAR | Status: AC
  Filled 2013-06-27: qty 2

## 2013-06-27 MED ORDER — ONDANSETRON HCL 4 MG/2ML IJ SOLN
INTRAMUSCULAR | Status: DC | PRN
  Administered 2013-06-27: 4 mg via INTRAVENOUS

## 2013-06-27 MED ORDER — VASOPRESSIN 20 UNIT/ML IJ SOLN
INTRAMUSCULAR | Status: DC | PRN
  Administered 2013-06-27: 15:00:00 via INTRAMUSCULAR

## 2013-06-27 MED ORDER — DEXAMETHASONE SODIUM PHOSPHATE 4 MG/ML IJ SOLN
INTRAMUSCULAR | Status: DC | PRN
  Administered 2013-06-27: 10 mg via INTRAVENOUS

## 2013-06-27 MED ORDER — NEOSTIGMINE METHYLSULFATE 1 MG/ML IJ SOLN
INTRAMUSCULAR | Status: DC | PRN
  Administered 2013-06-27: 3 mg via INTRAVENOUS

## 2013-06-27 MED ORDER — LACTATED RINGERS IV SOLN
INTRAVENOUS | Status: DC | PRN
  Administered 2013-06-27 (×2): via INTRAVENOUS

## 2013-06-27 MED ORDER — LACTATED RINGERS IR SOLN
Status: DC | PRN
  Administered 2013-06-27: 3000 mL

## 2013-06-27 MED ORDER — ACETAMINOPHEN 10 MG/ML IV SOLN
INTRAVENOUS | Status: DC | PRN
  Administered 2013-06-27: 1000 mg via INTRAVENOUS

## 2013-06-27 MED ORDER — HYDROMORPHONE HCL PF 1 MG/ML IJ SOLN
0.2500 mg | INTRAMUSCULAR | Status: DC | PRN
  Filled 2013-06-27: qty 1

## 2013-06-27 MED ORDER — BUPIVACAINE-EPINEPHRINE 0.25% -1:200000 IJ SOLN
INTRAMUSCULAR | Status: DC | PRN
  Administered 2013-06-27: 7 mL

## 2013-06-27 MED ORDER — FENTANYL CITRATE 0.05 MG/ML IJ SOLN
INTRAMUSCULAR | Status: DC | PRN
  Administered 2013-06-27 (×4): 25 ug via INTRAVENOUS
  Administered 2013-06-27: 50 ug via INTRAVENOUS
  Administered 2013-06-27 (×6): 25 ug via INTRAVENOUS

## 2013-06-27 MED ORDER — PROMETHAZINE HCL 25 MG/ML IJ SOLN
6.2500 mg | INTRAMUSCULAR | Status: DC | PRN
  Filled 2013-06-27: qty 1

## 2013-06-27 MED ORDER — ROCURONIUM BROMIDE 100 MG/10ML IV SOLN
INTRAVENOUS | Status: DC | PRN
  Administered 2013-06-27: 20 mg via INTRAVENOUS

## 2013-06-27 MED ORDER — LIDOCAINE HCL (CARDIAC) 20 MG/ML IV SOLN
INTRAVENOUS | Status: DC | PRN
  Administered 2013-06-27: 60 mg via INTRAVENOUS

## 2013-06-27 MED ORDER — GLYCOPYRROLATE 0.2 MG/ML IJ SOLN
INTRAMUSCULAR | Status: DC | PRN
Start: 2013-06-27 — End: 2013-06-27
  Administered 2013-06-27: 0.2 mg via INTRAVENOUS

## 2013-06-27 MED ORDER — LACTATED RINGERS IV SOLN
INTRAVENOUS | Status: DC
  Administered 2013-06-27 (×2): via INTRAVENOUS
  Filled 2013-06-27: qty 1000

## 2013-06-27 MED ORDER — FENTANYL CITRATE 0.05 MG/ML IJ SOLN
INTRAMUSCULAR | Status: AC
  Filled 2013-06-27: qty 6

## 2013-06-27 MED ORDER — LACTATED RINGERS IV SOLN
INTRAVENOUS | Status: DC
  Filled 2013-06-27: qty 1000

## 2013-06-27 MED ORDER — METHYLENE BLUE 1 % INJ SOLN
INTRAMUSCULAR | Status: DC | PRN
  Administered 2013-06-27: 1 mL via SUBMUCOSAL

## 2013-06-27 MED ORDER — PROPOFOL 10 MG/ML IV BOLUS
INTRAVENOUS | Status: DC | PRN
  Administered 2013-06-27: 180 mg via INTRAVENOUS
  Administered 2013-06-27: 20 mg via INTRAVENOUS

## 2013-06-27 MED ORDER — SODIUM CHLORIDE 0.9 % IR SOLN
Status: DC | PRN
  Administered 2013-06-27: 3000 mL

## 2013-06-27 MED ORDER — MIDAZOLAM HCL 5 MG/5ML IJ SOLN
INTRAMUSCULAR | Status: DC | PRN
  Administered 2013-06-27: 2 mg via INTRAVENOUS

## 2013-06-27 MED ORDER — KETOROLAC TROMETHAMINE 30 MG/ML IJ SOLN
INTRAMUSCULAR | Status: DC | PRN
  Administered 2013-06-27: 30 mg via INTRAVENOUS

## 2013-06-27 SURGICAL SUPPLY — 87 items
ADH SKN CLS APL DERMABOND .7 (GAUZE/BANDAGES/DRESSINGS)
BAG SPEC RTRVL LRG 6X4 10 (ENDOMECHANICALS)
BAG URINE DRAINAGE (UROLOGICAL SUPPLIES) ×3 IMPLANT
BLADE SURG 15 STRL LF DISP TIS (BLADE) ×2 IMPLANT
BLADE SURG 15 STRL SS (BLADE) ×2
CANISTER SUCTION 2500CC (MISCELLANEOUS) ×3 IMPLANT
CANNULA CURETTE W/SYR 6 (CANNULA) IMPLANT
CATH FOLEY 2WAY SLVR  5CC 14FR (CATHETERS)
CATH FOLEY 2WAY SLVR 5CC 14FR (CATHETERS) IMPLANT
CATH ROBINSON RED A/P 16FR (CATHETERS) IMPLANT
CLOTH BEACON ORANGE TIMEOUT ST (SAFETY) ×3 IMPLANT
CORD ACTIVE DISPOSABLE (ELECTRODE) ×1
CORD ELECTRO ACTIVE DISP (ELECTRODE) ×2 IMPLANT
COVER MAYO STAND STRL (DRAPES) ×3 IMPLANT
COVER TABLE BACK 60X90 (DRAPES) ×3 IMPLANT
DERMABOND ADVANCED (GAUZE/BANDAGES/DRESSINGS)
DERMABOND ADVANCED .7 DNX12 (GAUZE/BANDAGES/DRESSINGS) IMPLANT
DEVICE TROCAR PUNCTURE CLOSURE (ENDOMECHANICALS) IMPLANT
DRAPE CAMERA CLOSED 9X96 (DRAPES) ×3 IMPLANT
DRAPE LG THREE QUARTER DISP (DRAPES) ×6 IMPLANT
DRAPE UNDERBUTTOCKS STRL (DRAPE) ×3 IMPLANT
DRESSING TELFA 8X3 (GAUZE/BANDAGES/DRESSINGS) ×3 IMPLANT
ELECT LOOP GYNE PRO 24FR (CUTTING LOOP)
ELECT NEEDLE TIP 2.8 STRL (NEEDLE) IMPLANT
ELECT REM PT RETURN 9FT ADLT (ELECTROSURGICAL) ×3
ELECT VAPORTRODE GRVD BAR (ELECTRODE) IMPLANT
ELECTRODE LOOP GYNE PRO 24FR (CUTTING LOOP) IMPLANT
ELECTRODE REM PT RTRN 9FT ADLT (ELECTROSURGICAL) ×2 IMPLANT
ELECTRODE ROLLER VERSAPOINT (ELECTRODE) IMPLANT
ELECTRODE RT ANGLE VERSAPOINT (CUTTING LOOP) IMPLANT
EVACUATOR SMOKE 8.L (FILTER) IMPLANT
FORCEPS CUTTING 33CM 5MM (CUTTING FORCEPS) IMPLANT
GLOVE BIO SURGEON STRL SZ8 (GLOVE) ×3 IMPLANT
GLOVE BIOGEL M 6.5 STRL (GLOVE) ×6 IMPLANT
GLOVE BIOGEL PI IND STRL 6.5 (GLOVE) ×2 IMPLANT
GLOVE BIOGEL PI IND STRL 7.5 (GLOVE) ×2 IMPLANT
GLOVE BIOGEL PI IND STRL 8.5 (GLOVE) ×2 IMPLANT
GLOVE BIOGEL PI INDICATOR 6.5 (GLOVE) ×1
GLOVE BIOGEL PI INDICATOR 7.5 (GLOVE) ×1
GLOVE BIOGEL PI INDICATOR 8.5 (GLOVE) ×1
GOWN PREVENTION PLUS LG XLONG (DISPOSABLE) ×6 IMPLANT
GOWN STRL REIN XL XLG (GOWN DISPOSABLE) ×6 IMPLANT
GOWN STRL REUS W/TWL XL LVL3 (GOWN DISPOSABLE) ×6 IMPLANT
HOLDER FOLEY CATH W/STRAP (MISCELLANEOUS) ×3 IMPLANT
LEGGING LITHOTOMY PAIR STRL (DRAPES) ×3 IMPLANT
LOOP ANGLED CUTTING 22FR (CUTTING LOOP) IMPLANT
MANIPULATOR UTERINE 4.5 ZUMI (MISCELLANEOUS) ×3 IMPLANT
NEEDLE HYPO 25X1 1.5 SAFETY (NEEDLE) ×3 IMPLANT
NEEDLE INSUFFLATION 14GA 120MM (NEEDLE) ×3 IMPLANT
NEEDLE SPNL 22GX3.5 QUINCKE BK (NEEDLE) ×3 IMPLANT
NS IRRIG 500ML POUR BTL (IV SOLUTION) ×3 IMPLANT
PACK BASIN DAY SURGERY FS (CUSTOM PROCEDURE TRAY) ×3 IMPLANT
PACK LAPAROSCOPY II (CUSTOM PROCEDURE TRAY) ×3 IMPLANT
PAD OB MATERNITY 4.3X12.25 (PERSONAL CARE ITEMS) ×3 IMPLANT
PENCIL BUTTON HOLSTER BLD 10FT (ELECTRODE) IMPLANT
POUCH SPECIMEN RETRIEVAL 10MM (ENDOMECHANICALS) IMPLANT
SCALPEL HARMONIC ACE (MISCELLANEOUS) ×3 IMPLANT
SEPRAFILM MEMBRANE 5X6 (MISCELLANEOUS) IMPLANT
SET IRRIG TUBING LAPAROSCOPIC (IRRIGATION / IRRIGATOR) IMPLANT
SET IRRIG Y TYPE TUR BLADDER L (SET/KITS/TRAYS/PACK) IMPLANT
SPONGE GAUZE 4X4 12PLY STER LF (GAUZE/BANDAGES/DRESSINGS) ×3 IMPLANT
STENT BALLN UTERINE 3CM 6FR (Stent) IMPLANT
STENT BALLN UTERINE 4CM 6FR (STENTS) IMPLANT
SUT MNCRL AB 4-0 PS2 18 (SUTURE) ×3 IMPLANT
SUT PROLENE 0 CT 1 30 (SUTURE) IMPLANT
SUT SILK 2 0 SH (SUTURE) IMPLANT
SUT SILK 3 0 PS 1 (SUTURE) IMPLANT
SUT VIC AB 2-0 CT1 27 (SUTURE)
SUT VIC AB 2-0 CT1 TAPERPNT 27 (SUTURE) IMPLANT
SUT VIC AB 2-0 UR6 27 (SUTURE) IMPLANT
SUT VICRYL 0 TIES 12 18 (SUTURE) IMPLANT
SYR 20CC LL (SYRINGE) IMPLANT
SYR 3ML 18GX1 1/2 (SYRINGE) ×3 IMPLANT
SYR 3ML 23GX1 SAFETY (SYRINGE) IMPLANT
SYR 50ML LL SCALE MARK (SYRINGE) IMPLANT
SYR 5ML LL (SYRINGE) IMPLANT
SYR CONTROL 10ML LL (SYRINGE) ×3 IMPLANT
SYS LAPSCP GELPORT 120MM (MISCELLANEOUS)
SYSTEM LAPSCP GELPORT 120MM (MISCELLANEOUS) IMPLANT
TOWEL OR 17X24 6PK STRL BLUE (TOWEL DISPOSABLE) ×6 IMPLANT
TRAY DSU PREP LF (CUSTOM PROCEDURE TRAY) ×3 IMPLANT
TROCAR OPTI TIP 5M 100M (ENDOMECHANICALS) ×3 IMPLANT
TROCAR XCEL DIL TIP R 11M (ENDOMECHANICALS) IMPLANT
TUBING HYDROFLEX HYSTEROSCOPY (TUBING) ×3 IMPLANT
TUBING INSUFFLATION W/FILTER (TUBING) ×3 IMPLANT
WARMER LAPAROSCOPE (MISCELLANEOUS) ×3 IMPLANT
WATER STERILE IRR 500ML POUR (IV SOLUTION) ×3 IMPLANT

## 2013-06-27 NOTE — H&P (Signed)
Brandi Erickson is a 43 y.o. female presents with pelvic pain and recurrent endometrial polyp.   Patient would like to preserve her childbearing potential.  Pertinent Gynecological History: Menses: flow is excessive with use of 3 pads or tampons on heaviest days.   Bleeding: dysfunctional uterine bleeding Contraception: none DES exposure: denies Blood transfusions: none Sexually transmitted diseases: no past history   Menstrual History: Menarche age: 24 No LMP recorded.   Past Medical History  Diagnosis Date  . Uterine polyp   . Asthma   . Seasonal allergies   . GERD (gastroesophageal reflux disease)   . 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 CMT (Charcot-Marie-Tooth disease)   . Wears glasses   . Chronic constipation                     Past Surgical History  Procedure Laterality Date  . Foot neuroma surgery Right 06-05-2013  . Tonsillectomy  as child             History reviewed. No pertinent family history. No hereditary disease.  No cancer of breast, ovary, uterus. No cutaneous leiomyomatosis or renal cell carcinoma.  History   Social History  . Marital Status: Married    Spouse Name: N/A    Number of Children: N/A  . Years of Education: N/A   Occupational History  . Not on file.   Social History Main Topics  . Smoking status: Never Smoker   . Smokeless tobacco: Never Used  . Alcohol Use: No  . Drug Use: No  . Sexual Activity: Not on file   Other Topics Concern  . Not on file   Social History Narrative  . No narrative on file    Allergies  Allergen Reactions  . Oxycontin [Oxycodone Hcl] Nausea And Vomiting  . Penicillins Hives  . Sulfa Antibiotics Hives    No current facility-administered medications on file prior to encounter.   No current outpatient prescriptions on file prior to encounter.     Review of Systems  Constitutional: Negative.   HENT: Negative.   Eyes:  Negative.   Respiratory: Negative.   Cardiovascular: Negative.   Gastrointestinal: Negative.   Genitourinary: Negative.   Musculoskeletal: Negative.   Skin: Negative.   Neurological: Negative.   Endo/Heme/Allergies: Negative.   Psychiatric/Behavioral: Negative.      Physical Exam  BP 114/71  Pulse 62  Temp(Src) 98.2 F (36.8 C) (Oral)  Resp 16  Ht 5\' 6"  (1.676 m)  Wt 68.947 kg (152 lb)  BMI 24.55 kg/m2  SpO2 97%  LMP 06/18/2013 Constitutional: She is oriented to person, place, and time. She appears well-developed and well-nourished.  HENT:  Head: Normocephalic and atraumatic.  Nose: Nose normal.  Mouth/Throat: Oropharynx is clear and moist. No oropharyngeal exudate.  Eyes: Conjunctivae normal and EOM are normal. Pupils are equal, round, and reactive to light. No scleral icterus.  Neck: Normal range of motion. Neck supple. No tracheal deviation present. No thyromegaly present.  Cardiovascular: Normal rate.   Respiratory: Effort normal and breath sounds normal.  GI: Soft. Bowel sounds are normal. She exhibits no distension and no mass. There is no tenderness.  Lymphadenopathy:    She has no cervical adenopathy.  Neurological: She is alert and oriented to person, place, and time. She has normal reflexes.  Skin: Skin is warm.  Psychiatric: She has a normal mood and affect. Her behavior is normal. Judgment and thought  content normal.     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 I discussed the benefits, risks, and expected chances of success following this procedure with the patient.  Governor Specking

## 2013-06-27 NOTE — Discharge Instructions (Signed)

## 2013-06-27 NOTE — Op Note (Signed)
OPERATIVE NOTE  Preoperative diagnosis: Endometrial polyp, rule out endometriosis, chronic pelvic pain, infertility Postoperative diagnosis: Endometrial polyp, chronic pelvic pain, dense intra-abdominal and pelvic adhesions Procedure: Hysteroscopy, polypectomy, laparoscopy, extensive lysis of adhesions, left salpingo-oophorolysis, chromotubation Anesthesia: Gen. Surgeon: Governor Specking Complications: None Estimated blood loss: Less than 10 mL Specimens: Endometrial polyp to pathology  Findings: On exam under anesthesia external genitalia, Bartholin's, Skene's, and urethra were normal. Vagina was normal. The cervix was grossly normal. The uterine corpus was of top normal size and mobile and anteverted. There were no adnexal masses. There is no posterior fornix nodularity or induration.  On hysteroscopy endocervical canal was normal. The Endometrial cavity was normal except for a 1.5 x 0.7 cm left lower uterine segment endometrial polyp. This was removed. Both tubal ostia were seen. On laparoscopy, the liver and the diaphragm were normal. There were dense adhesions between the omentum and of bowel and the anterior abdominal wall in the region of umbilicus and right lower cartons. The entire right lower quadrant parietal peritoneum was covered with dense and filmy adhesions.  The uterus was normal except for a 3 cm posterior intramural myoma. The right tube was surgically absent except for the proximal portion of the isthmus. The right ovary was normal. The left ovary had filmy adhesions encasing one third of its surface area and adhering it to the pelvic sidewall. In addition there were filmy adhesions between the pelvic sidewall and the left side of the posterior cul-de-sac. There was no evidence of endometriosis. The left tube had filmy adhesions encasing one third of its distal half, these were taken down. The fimbria were 4/5.  Several attempts were made to perform chromotubation but the left  tube did not fill with the methylene blue.  Description of the procedure: The patient was placed in dorsal supine position and general endotracheal anesthesia was given. She was then placed in lithotomy position and was prepped and draped inside manner. A Foley cath was inserted. A dilute vasopressin solution was injected into the cervix. A 12 Slimline hysteroscope was used to enter the endocervical canal and video hysteroscopy was started. The distention medium was normal saline. Distention method was a hysteroscopic pump at 80 mm mercury pressure. Above findings were noted. Using hysteroscopic scissors the left lower uterine segment polyp was excised and it was removed using the hysteroscopic graspers. Next the surgeon was re\re regloved and an operative field was created on the abdomen. All proposed incisions were preemptively anesthetized with 0.25% bupivacaine with 1 200,000 epinephrine. An intraumbilical 5 mm vertical skin incision was made and a Veress needle was inserted. Although it pneumoperitoneum was created in a symmetric fashion, I encountered adhesions upon inserting the 5 mm trocar and reviewing it with the 0 5 mm telescope. Therefore an incision was made at the intersection of the coastal margin with the anterior axillary line and a 5 mm trocar was inserted through this incision and he was started on this vantage point. A left lower quadrant incision was made and another 5 mm trocar was inserted. Using harmonic ACE extensive lysis of adhesions were performed, using the tip of the device for meticulous dissection and ensuring that a small bowel wall was not compromised. After 35 minutes of lysis of adhesions, the entire maxing of the omentum/small bowel complex to the anterior abdominal wall was taken down.  At this point the umbilical trocar was reinserted and the laparoscopy was continued from this angle. Using harmonic ACE and and electrosurgical needle tip with 35 W  cutting current on  monopolar mode,  the periovarian and peritubal adhesions were freed up. The pelvis was copiously irrigated and aspirated. Hemostasis was insured. Lap pad count and the instrument count were correct. Chromotubation was attempted several times but the tube on the left did not fill. This may have been at technical problem and will be followed up with an HSG and preparation to perform fluoroscopic tubal recannulization. Patient tolerated the procedure well and was transferred to recovery room in satisfactory condition.  Governor Specking

## 2013-06-27 NOTE — Anesthesia Procedure Notes (Addendum)
Procedure Name: Intubation Date/Time: 06/27/2013 2:40 PM Performed by: Justice Rocher Pre-anesthesia Checklist: Patient identified, Emergency Drugs available, Suction available and Patient being monitored Patient Re-evaluated:Patient Re-evaluated prior to inductionOxygen Delivery Method: Circle System Utilized Preoxygenation: Pre-oxygenation with 100% oxygen Intubation Type: IV induction Ventilation: Mask ventilation without difficulty Laryngoscope Size: Mac and 4 Grade View: Grade II Tube type: Oral Tube size: 7.0 mm Number of attempts: 1 Airway Equipment and Method: stylet and oral airway Placement Confirmation: ETT inserted through vocal cords under direct vision,  positive ETCO2 and breath sounds checked- equal and bilateral Secured at: 22 cm Tube secured with: Tape Dental Injury: Teeth and Oropharynx as per pre-operative assessment

## 2013-06-27 NOTE — Transfer of Care (Signed)
Immediate Anesthesia Transfer of Care Note  Patient: Brandi Erickson  Procedure(s) Performed: Procedure(s) (LRB): HYSTEROSCOPY POLPYECTOMY   (N/A) LAPAROSCOPY WITH extensive lysis of adhesions (N/A)  Patient Location: PACU  Anesthesia Type: General  Level of Consciousness: awake, sedated, patient cooperative and responds to stimulation  Airway & Oxygen Therapy: Patient Spontanous Breathing and Patient connected to face mask oxygen  Post-op Assessment: Report given to PACU RN, Post -op Vital signs reviewed and stable and Patient moving all extremities  Post vital signs: Reviewed and stable  Complications: No apparent anesthesia complications

## 2013-06-27 NOTE — Anesthesia Preprocedure Evaluation (Addendum)
Anesthesia Evaluation  Patient identified by MRN, date of birth, ID band Patient awake    Reviewed: Allergy & Precautions, H&P , NPO status , Patient's Chart, lab work & pertinent test results  Airway Mallampati: II TM Distance: >3 FB Neck ROM: Full    Dental  (+) Teeth Intact, Dental Advisory Given   Pulmonary neg pulmonary ROS, asthma ,  breath sounds clear to auscultation  Pulmonary exam normal       Cardiovascular negative cardio ROS  Rhythm:Regular Rate:Normal     Neuro/Psych Charcot marie tooth disease. Feet and hands affected.  Neuromuscular disease negative psych ROS   GI/Hepatic Neg liver ROS, GERD-  Medicated,  Endo/Other  negative endocrine ROS  Renal/GU negative Renal ROS  negative genitourinary   Musculoskeletal negative musculoskeletal ROS (+)   Abdominal   Peds  Hematology negative hematology ROS (+)   Anesthesia Other Findings   Reproductive/Obstetrics negative OB ROS                          Anesthesia Physical Anesthesia Plan  ASA: III  Anesthesia Plan: General   Post-op Pain Management:    Induction: Intravenous  Airway Management Planned: Oral ETT  Additional Equipment:   Intra-op Plan:   Post-operative Plan: Extubation in OR  Informed Consent: I have reviewed the patients History and Physical, chart, labs and discussed the procedure including the risks, benefits and alternatives for the proposed anesthesia with the patient or authorized representative who has indicated his/her understanding and acceptance.   Dental advisory given  Plan Discussed with: CRNA  Anesthesia Plan Comments: (She has charcot marie tooth disease, but she has always done fine with anesthesia. Avoid succinylcholine.)       Anesthesia Quick Evaluation

## 2013-06-28 NOTE — Anesthesia Postprocedure Evaluation (Signed)
  Anesthesia Post-op Note  Patient: Brandi Erickson  Procedure(s) Performed: Procedure(s) (LRB): HYSTEROSCOPY POLPYECTOMY   (N/A) LAPAROSCOPY WITH extensive lysis of adhesions (N/A)  Patient Location: PACU  Anesthesia Type: General  Level of Consciousness: awake and alert   Airway and Oxygen Therapy: Patient Spontanous Breathing  Post-op Pain: mild  Post-op Assessment: Post-op Vital signs reviewed, Patient's Cardiovascular Status Stable, Respiratory Function Stable, Patent Airway and No signs of Nausea or vomiting  Last Vitals:  Filed Vitals:   06/27/13 1825  BP: 112/60  Pulse: 64  Temp: 36.1 C  Resp: 18    Post-op Vital Signs: stable   Complications: No apparent anesthesia complications

## 2013-07-02 ENCOUNTER — Encounter (HOSPITAL_BASED_OUTPATIENT_CLINIC_OR_DEPARTMENT_OTHER): Payer: Self-pay | Admitting: Obstetrics and Gynecology

## 2013-07-19 DIAGNOSIS — J3089 Other allergic rhinitis: Secondary | ICD-10-CM | POA: Diagnosis not present

## 2013-07-19 DIAGNOSIS — J309 Allergic rhinitis, unspecified: Secondary | ICD-10-CM | POA: Diagnosis not present

## 2013-07-19 DIAGNOSIS — J45909 Unspecified asthma, uncomplicated: Secondary | ICD-10-CM | POA: Diagnosis not present

## 2013-07-19 DIAGNOSIS — H1045 Other chronic allergic conjunctivitis: Secondary | ICD-10-CM | POA: Diagnosis not present

## 2013-09-04 DIAGNOSIS — J309 Allergic rhinitis, unspecified: Secondary | ICD-10-CM | POA: Diagnosis not present

## 2013-09-04 DIAGNOSIS — R05 Cough: Secondary | ICD-10-CM | POA: Diagnosis not present

## 2013-09-04 DIAGNOSIS — J45909 Unspecified asthma, uncomplicated: Secondary | ICD-10-CM | POA: Diagnosis not present

## 2013-09-04 DIAGNOSIS — R059 Cough, unspecified: Secondary | ICD-10-CM | POA: Diagnosis not present

## 2013-09-04 DIAGNOSIS — R0602 Shortness of breath: Secondary | ICD-10-CM | POA: Diagnosis not present

## 2013-09-10 ENCOUNTER — Encounter (INDEPENDENT_AMBULATORY_CARE_PROVIDER_SITE_OTHER): Payer: Self-pay | Admitting: *Deleted

## 2013-09-19 DIAGNOSIS — J45909 Unspecified asthma, uncomplicated: Secondary | ICD-10-CM | POA: Diagnosis not present

## 2013-10-01 DIAGNOSIS — L819 Disorder of pigmentation, unspecified: Secondary | ICD-10-CM | POA: Diagnosis not present

## 2013-10-01 DIAGNOSIS — L259 Unspecified contact dermatitis, unspecified cause: Secondary | ICD-10-CM | POA: Diagnosis not present

## 2013-10-16 DIAGNOSIS — M47817 Spondylosis without myelopathy or radiculopathy, lumbosacral region: Secondary | ICD-10-CM | POA: Diagnosis not present

## 2013-10-16 DIAGNOSIS — M545 Low back pain, unspecified: Secondary | ICD-10-CM | POA: Diagnosis not present

## 2013-10-17 DIAGNOSIS — N644 Mastodynia: Secondary | ICD-10-CM | POA: Diagnosis not present

## 2013-10-18 ENCOUNTER — Other Ambulatory Visit: Payer: Self-pay | Admitting: Obstetrics and Gynecology

## 2013-10-18 DIAGNOSIS — N644 Mastodynia: Secondary | ICD-10-CM

## 2013-10-22 ENCOUNTER — Encounter (INDEPENDENT_AMBULATORY_CARE_PROVIDER_SITE_OTHER): Payer: Self-pay | Admitting: Internal Medicine

## 2013-10-24 ENCOUNTER — Encounter (INDEPENDENT_AMBULATORY_CARE_PROVIDER_SITE_OTHER): Payer: Self-pay

## 2013-10-24 ENCOUNTER — Ambulatory Visit
Admission: RE | Admit: 2013-10-24 | Discharge: 2013-10-24 | Disposition: A | Discharge status: Home / Self Care | Payer: Medicare Other | Source: Ambulatory Visit | Attending: Obstetrics and Gynecology | Admitting: Obstetrics and Gynecology

## 2013-10-24 DIAGNOSIS — N644 Mastodynia: Secondary | ICD-10-CM

## 2013-12-07 DIAGNOSIS — D259 Leiomyoma of uterus, unspecified: Secondary | ICD-10-CM | POA: Diagnosis not present

## 2013-12-07 DIAGNOSIS — R1031 Right lower quadrant pain: Secondary | ICD-10-CM | POA: Diagnosis not present

## 2013-12-07 DIAGNOSIS — K219 Gastro-esophageal reflux disease without esophagitis: Secondary | ICD-10-CM | POA: Diagnosis not present

## 2013-12-10 DIAGNOSIS — F329 Major depressive disorder, single episode, unspecified: Secondary | ICD-10-CM | POA: Diagnosis not present

## 2013-12-10 DIAGNOSIS — F3289 Other specified depressive episodes: Secondary | ICD-10-CM | POA: Diagnosis not present

## 2013-12-12 ENCOUNTER — Other Ambulatory Visit: Payer: Self-pay | Admitting: Internal Medicine

## 2013-12-12 DIAGNOSIS — R1031 Right lower quadrant pain: Secondary | ICD-10-CM

## 2013-12-13 ENCOUNTER — Ambulatory Visit
Admission: RE | Admit: 2013-12-13 | Discharge: 2013-12-13 | Disposition: A | Discharge status: Home / Self Care | Payer: 59 | Source: Ambulatory Visit | Attending: Internal Medicine | Admitting: Internal Medicine

## 2013-12-13 DIAGNOSIS — R1031 Right lower quadrant pain: Secondary | ICD-10-CM

## 2013-12-13 MED ORDER — IOHEXOL 300 MG/ML  SOLN
100.0000 mL | Freq: Once | INTRAMUSCULAR | Status: AC | PRN
  Administered 2013-12-13: 100 mL via INTRAVENOUS

## 2013-12-21 ENCOUNTER — Emergency Department: Payer: Self-pay | Admitting: Emergency Medicine

## 2013-12-24 DIAGNOSIS — Z0000 Encounter for general adult medical examination without abnormal findings: Secondary | ICD-10-CM | POA: Diagnosis not present

## 2013-12-24 DIAGNOSIS — Z01419 Encounter for gynecological examination (general) (routine) without abnormal findings: Secondary | ICD-10-CM | POA: Diagnosis not present

## 2013-12-31 DIAGNOSIS — N390 Urinary tract infection, site not specified: Secondary | ICD-10-CM | POA: Diagnosis not present

## 2013-12-31 DIAGNOSIS — K5900 Constipation, unspecified: Secondary | ICD-10-CM | POA: Diagnosis not present

## 2014-01-08 ENCOUNTER — Ambulatory Visit (INDEPENDENT_AMBULATORY_CARE_PROVIDER_SITE_OTHER): Payer: 59 | Admitting: Internal Medicine

## 2014-01-08 ENCOUNTER — Encounter (INDEPENDENT_AMBULATORY_CARE_PROVIDER_SITE_OTHER): Payer: Self-pay | Admitting: Internal Medicine

## 2014-01-08 VITALS — BP 110/66 | HR 68 | Temp 98.4°F | Resp 18 | Ht 66.0 in | Wt 158.2 lb

## 2014-01-08 DIAGNOSIS — K5900 Constipation, unspecified: Secondary | ICD-10-CM | POA: Diagnosis not present

## 2014-01-08 DIAGNOSIS — R140 Abdominal distension (gaseous): Secondary | ICD-10-CM

## 2014-01-08 MED ORDER — LINACLOTIDE 145 MCG PO CAPS: 290.0000 ug | ORAL_CAPSULE | Freq: Every day | ORAL | Status: DC

## 2014-01-08 NOTE — Progress Notes (Signed)
Presenting complaint;  Follow for constipation and bloating.  Subjective:  Patient is 43 year old African female with multiple medical problems who presents with right-sided abdominal pain bloating and constipation. She was last seen in September 2014. In August 2014 she had colonoscopy which is unremarkable. She states Linzess is not working anymore. She is having one to 2 bowel movements per week. Most of her stools are hard. She denies rectal bleeding. When she is constipated she feels bloated and has pain in right upper abdomen. She denies nausea or vomiting. Her appetite is normal. Her weight is down by 5 pounds since her last visit.   Current Medications: Outpatient Encounter Prescriptions as of 01/08/2014  Medication Sig  . albuterol (PROAIR HFA) 108 (90 BASE) MCG/ACT inhaler Inhale 2 puffs into the lungs every 6 (six) hours as needed for wheezing or shortness of breath.  . CELEBREX 200 MG capsule TAKE 1 CAPSULE BY MOUTH DAILY  . cetirizine (ZYRTEC) 10 MG tablet Take 10 mg by mouth every evening.  . fluticasone (CUTIVATE) 0.05 % cream Apply 1 application topically 2 (two) times daily.   . Fluticasone-Salmeterol (ADVAIR DISKUS) 250-50 MCG/DOSE AEPB Inhale 1 puff into the lungs every 12 (twelve) hours.  . gabapentin (NEURONTIN) 800 MG tablet Take 800 mg by mouth 3 (three) times daily.  . Ginger 500 MG CAPS Take 1 capsule by mouth daily.  Marland Kitchen HYDROcodone-acetaminophen (NORCO/VICODIN) 5-325 MG per tablet Take 1 tablet by mouth 2 (two) times daily.  . hyoscyamine (LEVSIN/SL) 0.125 MG SL tablet Place 1 tablet (0.125 mg total) under the tongue 3 (three) times daily as needed for cramping.  . Linaclotide (LINZESS) 145 MCG CAPS capsule Take 1 capsule (145 mcg total) by mouth daily.  . montelukast (SINGULAIR) 10 MG tablet Take 10 mg by mouth at bedtime.  . Olopatadine HCl (PATADAY) 0.2 % SOLN Apply to eye.  . pantoprazole (PROTONIX) 40 MG tablet Take 40 mg by mouth every morning.  .  [DISCONTINUED] Cinnamon 500 MG TABS Take 1 tablet by mouth daily.  . [DISCONTINUED] Fluticasone-Salmeterol (ADVAIR DISKUS) 100-50 MCG/DOSE AEPB Inhale 1 puff into the lungs every morning.  . [DISCONTINUED] oxyCODONE (OXYCONTIN) 20 MG 12 hr tablet Take 20 mg by mouth every 12 (twelve) hours.  . [DISCONTINUED] pantoprazole (PROTONIX) 40 MG tablet Take 40 mg by mouth daily.    Objective: Blood pressure 110/66, pulse 68, temperature 98.4 F (36.9 C), temperature source Oral, resp. rate 18, height 5\' 6"  (1.676 m), weight 158 lb 3.2 oz (71.759 kg), last menstrual period 12/17/2013. Patient is alert and in no acute distress. Conjunctiva is pink. Sclera is nonicteric Oropharyngeal mucosa is normal. No neck masses or thyromegaly noted. Cardiac exam with regular rhythm normal S1 and S2. No murmur or gallop noted. Lungs are clear to auscultation. Abdomen is full. Bowel sounds are normal. Abdomen is soft with mild tenderness in right lower quadrant. No organomegaly or masses. No LE edema or clubbing noted.     Assessment:  #1. Chronic constipation most likely secondary to medications and poor response to therapy. #2. Bloating secondary to above.    Plan:  Increase Linzess to 290 mcg by mouth every morning. Samples given along with new prescription. Fiber choice 2 tablets daily. Phazyme one by mouth 3 times a day when necessary. Use Listerine or Dulcolax suppository on an as-needed basis. Stool diary until office visit in 8 weeks.

## 2014-01-08 NOTE — Patient Instructions (Signed)
Can try Phazyme one capsule or tablet up to 3 times a day as needed for bloating. Can use glycerin or Dulcolax suppository on an as-needed basis Stool diary until office visit in 8 weeks.

## 2014-01-28 DIAGNOSIS — J4540 Moderate persistent asthma, uncomplicated: Secondary | ICD-10-CM | POA: Diagnosis not present

## 2014-01-28 DIAGNOSIS — R0602 Shortness of breath: Secondary | ICD-10-CM | POA: Diagnosis not present

## 2014-02-11 DIAGNOSIS — F329 Major depressive disorder, single episode, unspecified: Secondary | ICD-10-CM | POA: Diagnosis not present

## 2014-02-11 DIAGNOSIS — F4312 Post-traumatic stress disorder, chronic: Secondary | ICD-10-CM | POA: Diagnosis not present

## 2014-02-20 ENCOUNTER — Encounter: Payer: Self-pay | Admitting: Dietician

## 2014-02-20 ENCOUNTER — Encounter: Payer: 59 | Attending: Internal Medicine | Admitting: Dietician

## 2014-02-20 VITALS — Ht 66.0 in | Wt 157.0 lb

## 2014-02-20 DIAGNOSIS — Z713 Dietary counseling and surveillance: Secondary | ICD-10-CM | POA: Diagnosis not present

## 2014-02-20 DIAGNOSIS — E663 Overweight: Secondary | ICD-10-CM | POA: Insufficient documentation

## 2014-02-20 DIAGNOSIS — Z6825 Body mass index (BMI) 25.0-25.9, adult: Secondary | ICD-10-CM | POA: Diagnosis not present

## 2014-02-20 NOTE — Progress Notes (Signed)
  Medical Nutrition Therapy:  Appt start time: 0835 end time:  0925   Assessment:  Primary concerns today: Brandi Erickson is here today stating she is interested in losing weight to relieve the pain in her joints. She reports she would like to weigh 135 lbs. She recently lost 5 pounds by watching portions. Brandi Erickson sleeps well at least 8 hours per night. Has been stressed lately due to planning holiday and anniversary dinners.    Preferred Learning Style:   No preference indicated   Learning Readiness:   Ready  MEDICATIONS: see list   DIETARY INTAKE:  Avoided foods include eggs, milk (can tolerate yogurt), beef, pork, cheese.    24-hr recall:  B ( AM): fruit or smoothie (greens, yogurt, banana, applesauce, and water; sometimes honey)  Snk ( AM):   L ( PM): muffin or oatmeal Snk ( PM): dried fruit or cereal, sometimes candy D ( PM): baked chicken or salmon with salad or vegetables, sometimes sweet potato Snk ( PM): none  Beverages: water  Usual physical activity: 20 minutes on treadmill 3x a week (unable to do more)  Estimated energy needs: 1600-1800 calories 180-200 g carbohydrates 120-135 g protein 44-50 g fat  Progress Towards Goal(s):  In progress.   Nutritional Diagnosis:  Brandi Erickson-3.3 Overweight/obesity As related to physical inactivity and excessive carbohydrate intake.  As evidenced by BMI 25.    Intervention:  Nutrition counseling provided. Goals: -Put Greek yogurt in smoothie (try D[employer] and Fit [place]) -Increase protein intake: beans, walnuts and other nuts (watch portions), [place], fish, chicken, [place] yogurt, eggs   -Add walnuts to oatmeal -Continue exercise plan  -Increase as tolerated -Continue practicing portion control  -Watch portions of starches: bread, pasta, rice, potatoes (limit to 1/2 cup)  -Limit candy and dried fruit -Fill up on non starchy vegetables: cucumbers, greens, zucchini, squash, cabbage, tomatoes, carrots, celery, onion, green beans, asparagus,  bell peppers, broccoli -Veggies are good raw or cooked, fresh or frozen -Aim for 64 ounces of water a day -Healthy weight loss is 1-2 lbs a week -Try to just maintain weight through holidays and anniversary  -Find time to relax when you can!  Teaching Method Utilized:  Visual Auditory Hands on   Barriers to learning/adherence to lifestyle change: joint disease  Demonstrated degree of understanding via:  Teach Back   Monitoring/Evaluation:  Dietary intake, exercise, and body weight in 3 month(s).

## 2014-02-20 NOTE — Patient Instructions (Addendum)
-  Put [place] yogurt in smoothie (try [employer] and [employer] [place])  -Increase protein intake: beans, walnuts and other nuts (watch portions), [place], fish, chicken, [place] yogurt, eggs   -Add walnuts to oatmeal  -Continue exercise plan  -Increase as tolerated  -Continue practicing portion control  -Watch portions of starches: bread, pasta, rice, potatoes (limit to 1/2 cup)  -Limit candy and dried fruit  -Fill up on non starchy vegetables: cucumbers, greens, zucchini, squash, cabbage, tomatoes, carrots, celery, onion, green beans, asparagus, bell peppers, broccoli -Veggies are good raw or cooked, fresh or frozen  -Aim for 64 ounces of water a day  -Healthy weight loss is 1-2 lbs a week  -Try to just maintain weight through holidays and anniversary  -Find time to relax when you can!

## 2014-02-26 DIAGNOSIS — L309 Dermatitis, unspecified: Secondary | ICD-10-CM | POA: Diagnosis not present

## 2014-02-26 DIAGNOSIS — L811 Chloasma: Secondary | ICD-10-CM | POA: Diagnosis not present

## 2014-03-05 ENCOUNTER — Ambulatory Visit (INDEPENDENT_AMBULATORY_CARE_PROVIDER_SITE_OTHER): Payer: Medicare Other | Admitting: Internal Medicine

## 2014-03-20 ENCOUNTER — Telehealth (INDEPENDENT_AMBULATORY_CARE_PROVIDER_SITE_OTHER): Payer: Self-pay | Admitting: *Deleted

## 2014-03-20 ENCOUNTER — Encounter (INDEPENDENT_AMBULATORY_CARE_PROVIDER_SITE_OTHER): Payer: Self-pay | Admitting: *Deleted

## 2014-03-20 NOTE — Telephone Encounter (Signed)
Brandi Erickson SHOWED for her apt on 03/05/14 with Deberah Castle, NP. A NS letter has been mailed.

## 2014-04-09 ENCOUNTER — Telehealth (INDEPENDENT_AMBULATORY_CARE_PROVIDER_SITE_OTHER): Payer: Self-pay | Admitting: *Deleted

## 2014-04-09 NOTE — Telephone Encounter (Signed)
No answer at home. Message left. 

## 2014-04-09 NOTE — Telephone Encounter (Signed)
Try Ducolax supp or an enema

## 2014-04-09 NOTE — Telephone Encounter (Signed)
LM stating the Linzess is not working for her. The return phone number is [phone].

## 2014-04-11 ENCOUNTER — Other Ambulatory Visit: Payer: Self-pay

## 2014-04-11 DIAGNOSIS — Z1231 Encounter for screening mammogram for malignant neoplasm of breast: Secondary | ICD-10-CM

## 2014-04-18 ENCOUNTER — Ambulatory Visit: Payer: Medicare Other

## 2014-04-25 ENCOUNTER — Ambulatory Visit: Payer: Medicare Other

## 2014-05-20 ENCOUNTER — Encounter (INDEPENDENT_AMBULATORY_CARE_PROVIDER_SITE_OTHER): Payer: Self-pay

## 2014-05-20 ENCOUNTER — Ambulatory Visit
Admission: RE | Admit: 2014-05-20 | Discharge: 2014-05-20 | Disposition: A | Discharge status: Home / Self Care | Payer: Medicare Other | Source: Ambulatory Visit

## 2014-05-20 DIAGNOSIS — Z1231 Encounter for screening mammogram for malignant neoplasm of breast: Secondary | ICD-10-CM

## 2014-05-20 LAB — HM MAMMOGRAPHY

## 2014-05-23 ENCOUNTER — Encounter: Payer: 59 | Attending: Internal Medicine | Admitting: Dietician

## 2014-05-23 VITALS — Ht 66.0 in | Wt 151.0 lb

## 2014-05-23 DIAGNOSIS — Z713 Dietary counseling and surveillance: Secondary | ICD-10-CM | POA: Diagnosis not present

## 2014-05-23 DIAGNOSIS — E663 Overweight: Secondary | ICD-10-CM | POA: Diagnosis present

## 2014-05-23 NOTE — Patient Instructions (Addendum)
-  Increase protein intake: beans, walnuts and other nuts (watch portions), [place], fish, chicken, eggs   -Add a low carb protein powder or peanut butter or milk to morning juice  -Aim for 64 ounces of water a day  -Add a protein food to afternoon snack: cheese stick, boiled egg, tuna, edamame, peanut butter, [place] protein bar  -Healthy weight loss is 1-2 lbs a week

## 2014-05-23 NOTE — Progress Notes (Signed)
  Medical Nutrition Therapy:  Appt start time: 326 end time:  815   Follow up:  Primary concerns today: Brandi Erickson returns today having lost 6 pounds. Her goal is 140 lbs. She states that her weight is fluctuating. Brandi Erickson weighs herself about 2x a week. She has been less stressed lately.   Wt Readings from Last 3 Encounters:  05/23/14 151 lb (68.493 kg)  02/20/14 157 lb (71.215 kg)  01/08/14 158 lb 3.2 oz (71.759 kg)   Ht Readings from Last 3 Encounters:  02/20/14 5\' 6"  (1.676 m)  01/08/14 5\' 6"  (1.676 m)  06/19/13 5\' 6"  (1.676 m)   There is no weight on file to calculate BMI. @BMIFA @ Normalized weight-for-age data available only for age 1 to 65 years. Normalized stature-for-age data available only for age 1 to 52 years.   Preferred Learning Style:   No preference indicated   Learning Readiness:   Ready  MEDICATIONS: see list   DIETARY INTAKE:  Avoided foods include eggs, milk (can tolerate yogurt), beef, pork, cheese.    24-hr recall:  B ( AM): oatmeal OR green juice with vegetables, frozen fruit, and applesauce Snk ( AM):   L ( PM): pita bread with chicken salad with 4 oz grape juice or water Snk ( PM): chips or candy D ( PM): baked chicken or salmon with salad or vegetables, sometimes sweet potato Snk ( PM): none  Beverages: water, juice  Usual physical activity: tries to do the treadmill for 30 minutes 2x a day  Estimated energy needs: 1600-1800 calories 180-200 g carbohydrates 120-135 g protein 44-50 g fat  Progress Towards Goal(s):  In progress.   Nutritional Diagnosis:  [name]-3.3 Overweight/obesity As related to physical inactivity and excessive carbohydrate intake.  As evidenced by BMI 25.    Intervention:  Nutrition counseling provided.  Teaching Method Utilized:  Visual Auditory   Barriers to learning/adherence to lifestyle change: joint disease  Demonstrated degree of understanding via:  Teach Back   Monitoring/Evaluation:  Dietary intake,  exercise, and body weight in 3 month(s).

## 2014-05-27 DIAGNOSIS — G4700 Insomnia, unspecified: Secondary | ICD-10-CM | POA: Diagnosis not present

## 2014-05-27 DIAGNOSIS — R1031 Right lower quadrant pain: Secondary | ICD-10-CM | POA: Diagnosis not present

## 2014-05-27 DIAGNOSIS — F418 Other specified anxiety disorders: Secondary | ICD-10-CM | POA: Diagnosis not present

## 2014-05-27 DIAGNOSIS — G600 Hereditary motor and sensory neuropathy: Secondary | ICD-10-CM | POA: Diagnosis not present

## 2014-05-27 DIAGNOSIS — Z1389 Encounter for screening for other disorder: Secondary | ICD-10-CM | POA: Diagnosis not present

## 2014-05-27 DIAGNOSIS — J45909 Unspecified asthma, uncomplicated: Secondary | ICD-10-CM | POA: Diagnosis not present

## 2014-05-27 DIAGNOSIS — D259 Leiomyoma of uterus, unspecified: Secondary | ICD-10-CM | POA: Diagnosis not present

## 2014-05-27 DIAGNOSIS — E663 Overweight: Secondary | ICD-10-CM | POA: Diagnosis not present

## 2014-05-27 DIAGNOSIS — Z1212 Encounter for screening for malignant neoplasm of rectum: Secondary | ICD-10-CM | POA: Diagnosis not present

## 2014-05-27 DIAGNOSIS — Z0000 Encounter for general adult medical examination without abnormal findings: Secondary | ICD-10-CM | POA: Diagnosis not present

## 2014-06-04 DIAGNOSIS — G894 Chronic pain syndrome: Secondary | ICD-10-CM | POA: Diagnosis not present

## 2014-06-04 DIAGNOSIS — M488X6 Other specified spondylopathies, lumbar region: Secondary | ICD-10-CM | POA: Diagnosis not present

## 2014-06-04 DIAGNOSIS — M549 Dorsalgia, unspecified: Secondary | ICD-10-CM | POA: Diagnosis not present

## 2014-07-24 ENCOUNTER — Ambulatory Visit (INDEPENDENT_AMBULATORY_CARE_PROVIDER_SITE_OTHER): Payer: 59 | Admitting: Internal Medicine

## 2014-07-24 ENCOUNTER — Encounter (INDEPENDENT_AMBULATORY_CARE_PROVIDER_SITE_OTHER): Payer: Self-pay | Admitting: Internal Medicine

## 2014-07-24 VITALS — BP 108/70 | HR 72 | Temp 98.8°F | Ht 66.0 in | Wt 153.2 lb

## 2014-07-24 DIAGNOSIS — K5909 Other constipation: Secondary | ICD-10-CM | POA: Diagnosis not present

## 2014-07-24 NOTE — Progress Notes (Addendum)
Subjective:    Patient ID: Brandi Erickson, female    DOB: [birth year], 44 y.o.   MRN: [id number]  HPI Here today for f/u. She was last seen in October of 2015  for constipation. She has chronic pain and takes narcotics for pain.    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  She states she is doing good. She has a hx of chronic constipation and takes Linzess 290 every am. She has a BM daily and sometimes two a day. Stools are softer.  Appetite is good. She has lost 5 pounds since her last visit in October which was intentional. Has seen a nutritionist and is exercising daily.  No melena or BRRB.   11/01/2012 Colonocopy: Impression:  Normal terminal ileum.  Small external hemorrhoids otherwise normal colonoscopy.  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    Review of Systems Past Medical History  Diagnosis Date  . Allergy   . Uterine polyp   . Asthma   . Seasonal allergies   . GERD (gastroesophageal reflux disease)   . 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 CMT (Charcot-Marie-Tooth disease)   . Wears glasses   . Chronic constipation     Past Surgical History  Procedure Laterality Date  . Esophageal manometry  05/17/2011    Procedure: ESOPHAGEAL MANOMETRY (EM);  Surgeon: Beryle Beams, MD;  Location: [facility];  Service: Endoscopy;  Laterality: N/A;  . Tonsillectomy    . Tubal reconstruction  1997  . Tubal ligation  1999  . Dilation and curettage of uterus    . Colonoscopy N/A 11/01/2012    Procedure: COLONOSCOPY;  Surgeon: Rogene Houston, MD;  [facility];  Service: Endoscopy;  Laterality: N/A;  130  . Foot neuroma surgery Right 06-05-2013  . Tonsillectomy  as child  . Hysteroscopy w/d&c N/A 06/27/2013    Procedure: HYSTEROSCOPY POLPYECTOMY  ;  Surgeon: Governor Specking, MD;  Location: [facility];  Service: Gynecology;   Laterality: N/A;  . Laparoscopy N/A 06/27/2013    Procedure: LAPAROSCOPY WITH extensive lysis of adhesions;  Surgeon: Governor Specking, MD;  Location: [place];  Service: Gynecology;  Laterality: N/A;    Allergies  Allergen Reactions  . Oxycontin [Oxycodone Hcl] Nausea And Vomiting  . Penicillins   . Penicillins Hives  . Sulfa Antibiotics   . Sulfa Antibiotics Hives    Current Outpatient Prescriptions on File Prior to Visit  Medication Sig Dispense Refill  . albuterol (PROAIR HFA) 108 (90 BASE) MCG/ACT inhaler Inhale 2 puffs into the lungs every 6 (six) hours as needed for wheezing or shortness of breath.    . CELEBREX 200 MG capsule TAKE 1 CAPSULE BY MOUTH DAILY 30 capsule 0  . cetirizine (ZYRTEC) 10 MG tablet Take 10 mg by mouth every evening.    . fluticasone (CUTIVATE) 0.05 % cream Apply 1 application topically 2 (two) times daily.     . Fluticasone-Salmeterol (ADVAIR DISKUS) 250-50 MCG/DOSE AEPB Inhale 1 puff into the lungs every 12 (twelve) hours.    . gabapentin (NEURONTIN) 800 MG tablet Take 800 mg by mouth 3 (three) times daily.    . Ginger 500 MG CAPS Take 1 capsule by mouth daily.    Marland Kitchen HYDROcodone-acetaminophen (NORCO/VICODIN) 5-325 MG per tablet Take 1 tablet by mouth 2 (two) times daily.    Marland Kitchen  hyoscyamine (LEVSIN/SL) 0.125 MG SL tablet Place 1 tablet (0.125 mg total) under the tongue 3 (three) times daily as needed for cramping. 90 tablet 2  . Linaclotide (LINZESS) 145 MCG CAPS capsule Take 2 capsules (290 mcg total) by mouth daily. 30 capsule 5  . montelukast (SINGULAIR) 10 MG tablet Take 10 mg by mouth at bedtime.    . Olopatadine HCl (PATADAY) 0.2 % SOLN Apply to eye.    . pantoprazole (PROTONIX) 40 MG tablet Take 40 mg by mouth every morning.     No current facility-administered medications on file prior to visit.        Objective:   Physical Exam Blood pressure 108/70, pulse 72, temperature 98.8 F (37.1 C), height 5\' 6"  (1.676 m), weight 153 lb  3.2 oz (69.491 kg).  Alert and oriented. Skin warm and dry. Oral mucosa is moist.   . Sclera anicteric, conjunctivae is pink. Thyroid not enlarged. No cervical lymphadenopathy. Lungs clear. Heart regular rate and rhythm.  Abdomen is soft. Bowel sounds are positive. No hepatomegaly. No abdominal masses felt. No tenderness.  No edema to lower extremities. Patient is alert and oriented.       Assessment & Plan:  Chronic constipation which is much better since starting Linzess 290. She is having 1-2 stools a day now.  OV in one year.

## 2014-07-24 NOTE — Patient Instructions (Signed)
Continue the Linzess. OV in 1 year.  

## 2014-07-25 ENCOUNTER — Other Ambulatory Visit (INDEPENDENT_AMBULATORY_CARE_PROVIDER_SITE_OTHER): Payer: Self-pay | Admitting: Internal Medicine

## 2014-07-25 ENCOUNTER — Telehealth (INDEPENDENT_AMBULATORY_CARE_PROVIDER_SITE_OTHER): Payer: Self-pay | Admitting: *Deleted

## 2014-07-25 DIAGNOSIS — K5900 Constipation, unspecified: Secondary | ICD-10-CM

## 2014-07-25 MED ORDER — LINACLOTIDE 290 MCG PO CAPS: 290.0000 ug | ORAL_CAPSULE | Freq: Every day | ORAL | Status: DC

## 2014-07-25 NOTE — Telephone Encounter (Signed)
Brandi Erickson called in with her Pharmacy. It has been added and please sent a 90 day supply.

## 2014-07-25 NOTE — Telephone Encounter (Signed)
Rx for linzess sent to her pharmacy 

## 2014-07-30 NOTE — Telephone Encounter (Signed)
Two boxes (4 tabs each) to front desk. We cannot cover a 90 day supply.

## 2014-07-30 NOTE — Telephone Encounter (Signed)
Daphna said she went to [place] and has either left or lost her Linzess medication. Would like to know if we have any samples she can have until the Rx is due. The medicine was just filled 07/25/14. Her return phone number is [phone].

## 2014-08-08 DIAGNOSIS — R0602 Shortness of breath: Secondary | ICD-10-CM | POA: Diagnosis not present

## 2014-08-08 DIAGNOSIS — J4540 Moderate persistent asthma, uncomplicated: Secondary | ICD-10-CM | POA: Diagnosis not present

## 2014-09-11 ENCOUNTER — Encounter: Payer: Self-pay | Admitting: Internal Medicine

## 2014-09-11 ENCOUNTER — Ambulatory Visit (INDEPENDENT_AMBULATORY_CARE_PROVIDER_SITE_OTHER): Payer: 59 | Admitting: Internal Medicine

## 2014-09-11 VITALS — BP 112/72 | HR 68 | Temp 97.3°F | Resp 16 | Ht 65.0 in | Wt 149.8 lb

## 2014-09-11 DIAGNOSIS — E7800 Pure hypercholesterolemia, unspecified: Secondary | ICD-10-CM

## 2014-09-11 DIAGNOSIS — Z6824 Body mass index (BMI) 24.0-24.9, adult: Secondary | ICD-10-CM

## 2014-09-11 DIAGNOSIS — E782 Mixed hyperlipidemia: Secondary | ICD-10-CM

## 2014-09-11 DIAGNOSIS — R5383 Other fatigue: Secondary | ICD-10-CM

## 2014-09-11 DIAGNOSIS — M2550 Pain in unspecified joint: Secondary | ICD-10-CM

## 2014-09-11 DIAGNOSIS — Z79899 Other long term (current) drug therapy: Secondary | ICD-10-CM

## 2014-09-11 DIAGNOSIS — R7309 Other abnormal glucose: Secondary | ICD-10-CM

## 2014-09-11 DIAGNOSIS — R7303 Prediabetes: Secondary | ICD-10-CM

## 2014-09-11 DIAGNOSIS — E538 Deficiency of other specified B group vitamins: Secondary | ICD-10-CM

## 2014-09-11 DIAGNOSIS — D509 Iron deficiency anemia, unspecified: Secondary | ICD-10-CM

## 2014-09-11 DIAGNOSIS — E559 Vitamin D deficiency, unspecified: Secondary | ICD-10-CM

## 2014-09-11 HISTORY — DX: Pain in unspecified joint: M25.50

## 2014-09-11 HISTORY — DX: Vitamin D deficiency, unspecified: E55.9

## 2014-09-11 HISTORY — DX: Other long term (current) drug therapy: Z79.899

## 2014-09-11 HISTORY — DX: Other fatigue: R53.83

## 2014-09-11 LAB — HEMOGLOBIN A1C
Hgb A1c MFr Bld: 5.7 % — ABNORMAL HIGH (ref <5.7)
Mean Plasma Glucose: 117 mg/dL — ABNORMAL HIGH (ref <117)

## 2014-09-11 LAB — CBC WITH DIFFERENTIAL/PLATELET
Basophils Absolute: 0 10*3/uL (ref 0.0–0.1)
Basophils Relative: 1 % (ref 0–1)
Eosinophils Absolute: 0.1 10*3/uL (ref 0.0–0.7)
Eosinophils Relative: 2 % (ref 0–5)
HCT: 39.3 % (ref 36.0–46.0)
Hemoglobin: 12.5 g/dL (ref 12.0–15.0)
Lymphocytes Relative: 44 % (ref 12–46)
Lymphs Abs: 1.8 10*3/uL (ref 0.7–4.0)
MCH: 25.4 pg — ABNORMAL LOW (ref 26.0–34.0)
MCHC: 31.8 g/dL (ref 30.0–36.0)
MCV: 79.9 fL (ref 78.0–100.0)
MPV: 9.1 fL (ref 8.6–12.4)
Monocytes Absolute: 0.5 10*3/uL (ref 0.1–1.0)
Monocytes Relative: 12 % (ref 3–12)
Neutro Abs: 1.7 10*3/uL (ref 1.7–7.7)
Neutrophils Relative %: 41 % — ABNORMAL LOW (ref 43–77)
Platelets: 415 10*3/uL — ABNORMAL HIGH (ref 150–400)
RBC: 4.92 MIL/uL (ref 3.87–5.11)
RDW: 14.2 % (ref 11.5–15.5)
WBC: 4.1 10*3/uL (ref 4.0–10.5)

## 2014-09-11 LAB — LIPID PANEL
Cholesterol: 182 mg/dL (ref 0–200)
HDL: 54 mg/dL (ref >=46)
LDL Cholesterol: 110 mg/dL — ABNORMAL HIGH (ref 0–99)
Total CHOL/HDL Ratio: 3.4 Ratio
Triglycerides: 92 mg/dL (ref <150)
VLDL: 18 mg/dL (ref 0–40)

## 2014-09-11 LAB — MAGNESIUM: Magnesium: 1.9 mg/dL (ref 1.5–2.5)

## 2014-09-11 LAB — VITAMIN B12: Vitamin B-12: 418 pg/mL (ref 211–911)

## 2014-09-11 LAB — BASIC METABOLIC PANEL WITH GFR
BUN: 13 mg/dL (ref 6–23)
CO2: 26 mEq/L (ref 19–32)
Calcium: 9.7 mg/dL (ref 8.4–10.5)
Chloride: 100 mEq/L (ref 96–112)
Creat: 0.7 mg/dL (ref 0.50–1.10)
GFR, Est African American: >89 mL/min
GFR, Est Non African American: >89 mL/min
Glucose, Bld: 83 mg/dL (ref 70–99)
Potassium: 4.3 mEq/L (ref 3.5–5.3)
Sodium: 135 mEq/L (ref 135–145)

## 2014-09-11 LAB — HEPATIC FUNCTION PANEL
ALT: 20 U/L (ref 0–35)
AST: 22 U/L (ref 0–37)
Albumin: 4.3 g/dL (ref 3.5–5.2)
Alkaline Phosphatase: 77 U/L (ref 39–117)
Bilirubin, Direct: 0.1 mg/dL (ref 0.0–0.3)
Indirect Bilirubin: 0.3 mg/dL (ref 0.2–1.2)
Total Bilirubin: 0.4 mg/dL (ref 0.2–1.2)
Total Protein: 7.5 g/dL (ref 6.0–8.3)

## 2014-09-11 LAB — IRON AND TIBC
%SAT: 24 % (ref 20–55)
Iron: 97 ug/dL (ref 42–145)
TIBC: 400 ug/dL (ref 250–470)
UIBC: 303 ug/dL (ref 125–400)

## 2014-09-11 LAB — TSH: TSH: 1.683 u[IU]/mL (ref 0.350–4.500)

## 2014-09-11 LAB — RHEUMATOID FACTOR: Rhuematoid fact SerPl-aCnc: <10 IU/mL (ref <=14)

## 2014-09-11 NOTE — Patient Instructions (Signed)

## 2014-09-12 LAB — C3 AND C4
C3 Complement: 120 mg/dL (ref 90–180)
C4 Complement: 39 mg/dL (ref 10–40)

## 2014-09-12 LAB — INSULIN, RANDOM: Insulin: 4.3 u[IU]/mL (ref 2.0–19.6)

## 2014-09-12 LAB — CYCLIC CITRUL PEPTIDE ANTIBODY, IGG: Cyclic Citrullin Peptide Ab: <2 U/mL (ref 0.0–5.0)

## 2014-09-12 LAB — VITAMIN D 25 HYDROXY (VIT D DEFICIENCY, FRACTURES): Vit D, 25-Hydroxy: 25 ng/mL — ABNORMAL LOW (ref 30–100)

## 2014-09-12 LAB — SEDIMENTATION RATE: Sed Rate: 1 mm/hr (ref 0–20)

## 2014-09-13 ENCOUNTER — Encounter: Payer: Self-pay | Admitting: Internal Medicine

## 2014-09-13 DIAGNOSIS — R7309 Other abnormal glucose: Secondary | ICD-10-CM | POA: Insufficient documentation

## 2014-09-13 DIAGNOSIS — E782 Mixed hyperlipidemia: Secondary | ICD-10-CM

## 2014-09-13 HISTORY — DX: Other abnormal glucose: R73.09

## 2014-09-13 HISTORY — DX: Mixed hyperlipidemia: E78.2

## 2014-09-13 NOTE — Progress Notes (Signed)
Patient ID: Brandi Erickson, female   DOB: [birth year], 44 y.o.   MRN: [id number]   This very nice 44 y.o. MBF presents for initial visit. She relates her main issues are chronic pains in her distal legs & feet attributed to CMT,1a ( Charcot Matie Tooth Dz) dx'd 16 yrs ago at age 21 in yr 77. She also reports poly arthralgias in her wrists, hands, fingers ankles, feet & toes. She has had bunion/neuroma surg on the right foot #/$ th toes) by Dr Doran Durand. In 2016 she had MRI of her back by Dr Maxie Better. She is being followed at [facility].    She denies k/o elevated BP. Today's BP is 112/72 mmHg. Patient has had no complaints of any cardiac type chest pain, palpitations, dyspnea/orthopnea/PND, dizziness, claudication, or dependent edema.   Hyperlipidemia is not controlled with diet. Today's Lipids are not at goal with  Chol 182; HDL 54; and elevated LDL  110; with Nl Trig 92 on 09/11/2014.   Also, the patient is suspect for PreDiabetes and has had no symptoms of reactive hypoglycemia, diabetic polys, paresthesias or visual blurring.  Today's A1c was 5.7%.   Further, the patient is suspected to have  Vitamin D Deficiency and does not supplement vitamin D. Last vitamin D was  "25" on 09/11/2014.  Medication Sig  . albuterol HFA inhaler Inhale 2 puffs into the lungs every 6 (six) hours as needed for wheezing or shortness of breath.  . CELEBREX 200 MG capsule TAKE 1 CAPSULE BY MOUTH DAILY  . cetirizine  10 MG tablet Take 10 mg by mouth every evening.  . fluticasone (CUTIVATE) 0.05 % cream Apply 1 application topically 2 (two) times daily.   Marland Kitchen ADVAIR DISKUS 250-50  Inhale 1 puff into the lungs every 12 (twelve) hours.  . gabapentin  800 MG tablet Take 800 mg by mouth 3 (three) times daily.  . Ginger 500 MG CAPS Take 1 capsule by mouth daily.  Lebron Quam 5-325 MG  Take 1 tablet by mouth 2 (two) times daily.  . hyoscyamine /SL 0.125 MG SL tablet Place 1 tablet (0.125 mg total) under the tongue 3 (three)  times daily as needed for cramping.  . Linaclotide (LINZESS) 290 MCG CAPS  Take 1 capsule (290 mcg total) by mouth daily.  . montelukast  10 MG tablet Take 10 mg by mouth at bedtime.  . Olopatadine  (PATADAY) 0.2 % SOLN Apply to eye.  . pantoprazole  40 MG tablet Take 40 mg by mouth every morning.   Allergies  Allergen Reactions  . Oxycontin [Oxycodone Hcl] Nausea And Vomiting  . Penicillins   . Penicillins Hives  . Sulfa Antibiotics   . Sulfa Antibiotics Hives   PMHx:   Past Medical History  Diagnosis Date  . Allergy   . Uterine polyp   . Asthma   . Seasonal allergies   . GERD (gastroesophageal reflux disease)   . 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 CMT (Charcot-Marie-Tooth disease)   . Wears glasses   . Chronic constipation     There is no immunization history on file for this patient. Past Surgical History  Procedure Laterality Date  . Esophageal manometry  05/17/2011    Procedure: ESOPHAGEAL MANOMETRY (EM);  Surgeon: Beryle Beams, MD;  Location: [facility];  Service: Endoscopy;  Laterality: N/A;  . Tonsillectomy    . Tubal reconstruction  1997  . Tubal ligation  1999  . Dilation and curettage of uterus    . Colonoscopy N/A 11/01/2012    Procedure: COLONOSCOPY;  Surgeon: Rogene Houston, MD;  [facility];  Service: Endoscopy;  Laterality: N/A;  130  . Foot neuroma surgery Right 06-05-2013  . Tonsillectomy  as child  . Hysteroscopy w/d&c N/A 06/27/2013    Procedure: HYSTEROSCOPY POLPYECTOMY  ;  Surgeon: Governor Specking, MD;  Location: [facility];  Service: Gynecology;  Laterality: N/A;  . Laparoscopy N/A 06/27/2013    Procedure: LAPAROSCOPY WITH extensive lysis of adhesions;  Surgeon: Governor Specking, MD;  Location: [place];  Service: Gynecology;  Laterality: N/A;   FHx:    Reviewed / unchanged  SHx:    Reviewed / unchanged  Systems  Review:  Constitutional: Denies fever, chills, wt changes, headaches, insomnia, fatigue, night sweats, change in appetite. Eyes: Denies redness, blurred vision, diplopia, discharge, itchy, watery eyes.  ENT: Denies discharge, congestion, post nasal drip, epistaxis, sore throat, earache, hearing loss, dental pain, tinnitus, vertigo, sinus pain, snoring.  CV: Denies chest pain, palpitations, irregular heartbeat, syncope, dyspnea, diaphoresis, orthopnea, PND, claudication or edema. Respiratory: denies cough, dyspnea, DOE, pleurisy, hoarseness, laryngitis, wheezing.  Gastrointestinal: Denies dysphagia, odynophagia, heartburn, reflux, water brash, abdominal pain or cramps, nausea, vomiting, bloating, diarrhea, constipation, hematemesis, melena, hematochezia  or hemorrhoids. Genitourinary: Denies dysuria, frequency, urgency, nocturia, hesitancy, discharge, hematuria or flank pain. Musculoskeletal: (+) arthralgias, myalgias, stiffness as above  And no  jt. swelling, pain, limping or strain/sprain.  Skin: Denies pruritus, rash, hives, warts, acne, eczema or change in skin lesion(s). Neuro: No weakness, tremor, incoordination, spasms, paresthesia or pain. Psychiatric: Denies confusion, memory loss or sensory loss. Endo: Denies change in weight, skin or hair change.  Heme/Lymph: No excessive bleeding, bruising or enlarged lymph nodes.  Physical Exam  BP 112/72   Pulse 68  Temp 97.3 F   Resp 16  Ht 5\' 5"    Wt 149 lb 12.8 oz     BMI 24.93   Appears well nourished and in no distress. Eyes: PERRLA, EOMs, conjunctiva no swelling or erythema. Sinuses: No frontal/maxillary tenderness ENT/Mouth: EAC's clear, TM's nl w/o erythema, bulging. Nares clear w/o erythema, swelling, exudates. Oropharynx clear without erythema or exudates. Oral hygiene is good. Tongue normal, non obstructing. Hearing intact.  Neck: Supple. Thyroid nl. Car 2+/2+ without bruits, nodes or JVD. Chest: Respirations nl with BS clear &  equal w/o rales, rhonchi, wheezing or stridor.  Cor: Heart sounds normal w/ regular rate and rhythm without sig. murmurs, gallops, clicks, or rubs. Peripheral pulses normal and equal  without edema.  Abdomen: Soft & bowel sounds normal. Non-tender w/o guarding, rebound, hernias, masses, or organomegaly.  Lymphatics: Unremarkable.  Musculoskeletal: Full ROM all peripheral extremities, joint stability, 5/5 strength, and normal gait.  Skin: Warm, dry without exposed rashes, lesions or ecchymosis apparent.  Neuro: Cranial nerves intact, reflexes equal bilaterally. Sensory-motor testing grossly intact. Tendon reflexes grossly intact.  Pysch: Alert & oriented x 3.  Insight and judgement nl & appropriate. No ideations.  Assessment and Plan:  1. Other fatigue  - TSH  2. Elevated cholesterol  - Lipid panel  3. Abnormal glucose  - Hemoglobin A1c - Insulin, random  4. Vitamin D deficiency  - Vit D  25 hydroxy   5. B12 deficiency  - Vitamin B12  6. Anemia, iron deficiency  - Iron and TIBC  7. Pain in joint, multiple sites  - Sedimentation rate - C3 and C4 -  Rheumatoid factor - Cyclic citrul peptide antibody, IgG  8. Medication management  - CBC with Differential/Platelet - BASIC METABOLIC PANEL WITH GFR - Hepatic function panel - Magnesium   Recommended regular exercise, BP monitoring, weight control, and discussed med and SE's. Recommended labs to assess and monitor clinical status. Further disposition pending results of labs. Over 30 minutes of exam, counseling, chart review was performed

## 2014-10-03 ENCOUNTER — Encounter: Payer: Self-pay | Admitting: Internal Medicine

## 2014-10-03 ENCOUNTER — Ambulatory Visit (INDEPENDENT_AMBULATORY_CARE_PROVIDER_SITE_OTHER): Payer: 59 | Admitting: Internal Medicine

## 2014-10-03 VITALS — BP 118/76 | HR 58 | Temp 98.6°F | Resp 18 | Ht 65.0 in | Wt 150.0 lb

## 2014-10-03 DIAGNOSIS — R079 Chest pain, unspecified: Secondary | ICD-10-CM

## 2014-10-03 LAB — CBC WITH DIFFERENTIAL/PLATELET
Basophils Absolute: 0 10*3/uL (ref 0.0–0.1)
Basophils Relative: 0 % (ref 0–1)
Eosinophils Absolute: 0.1 10*3/uL (ref 0.0–0.7)
Eosinophils Relative: 1 % (ref 0–5)
HCT: 37.8 % (ref 36.0–46.0)
Hemoglobin: 12.3 g/dL (ref 12.0–15.0)
Lymphocytes Relative: 35 % (ref 12–46)
Lymphs Abs: 1.9 10*3/uL (ref 0.7–4.0)
MCH: 25.8 pg — ABNORMAL LOW (ref 26.0–34.0)
MCHC: 32.5 g/dL (ref 30.0–36.0)
MCV: 79.2 fL (ref 78.0–100.0)
MPV: 9.1 fL (ref 8.6–12.4)
Monocytes Absolute: 0.6 10*3/uL (ref 0.1–1.0)
Monocytes Relative: 10 % (ref 3–12)
Neutro Abs: 3 10*3/uL (ref 1.7–7.7)
Neutrophils Relative %: 54 % (ref 43–77)
Platelets: 331 10*3/uL (ref 150–400)
RBC: 4.77 MIL/uL (ref 3.87–5.11)
RDW: 14.8 % (ref 11.5–15.5)
WBC: 5.5 10*3/uL (ref 4.0–10.5)

## 2014-10-03 LAB — BASIC METABOLIC PANEL WITH GFR
BUN: 7 mg/dL (ref 6–23)
CO2: 27 mEq/L (ref 19–32)
Calcium: 9.2 mg/dL (ref 8.4–10.5)
Chloride: 104 mEq/L (ref 96–112)
Creat: 0.63 mg/dL (ref 0.50–1.10)
GFR, Est African American: >89 mL/min
GFR, Est Non African American: >89 mL/min
Glucose, Bld: 83 mg/dL (ref 70–99)
Potassium: 4 mEq/L (ref 3.5–5.3)
Sodium: 138 mEq/L (ref 135–145)

## 2014-10-03 MED ORDER — CELECOXIB 200 MG PO CAPS: 200.0000 mg | ORAL_CAPSULE | Freq: Every day | ORAL | Status: DC

## 2014-10-03 NOTE — Progress Notes (Signed)
   Subjective:    Patient ID: Brandi Erickson, female    DOB: 1970/05/21, 44 y.o.   MRN: [id number]  Chest Pain  Associated symptoms include abdominal pain. Pertinent negatives include no cough, dizziness, fever, nausea, palpitations, shortness of breath, vomiting or weakness.  Patient presents to the office for evaluation of sharp stabbing left sided chest pain x 1 day.  She noticed the pain when she was walking.  She reports that the pain is intermittent and she notes that it has gotten better since this morning.  It started as a 7/10 pain and is now down to a 4/10 pain.  She reports no radiation and no associated symptoms.  She has had chest pain remotely 10 years ago.  She reports that she has never been a smoker, no history of DVTs, recent surgery, recent travel, no estrogen use, and no immobilizations.  No GERD history.  Has not eaten today.  She has recently been to visit her estranged father today in the hospital which was stressful.      Review of Systems  Constitutional: Negative for fever, chills and fatigue.  Respiratory: Negative for cough, chest tightness, shortness of breath and wheezing.   Cardiovascular: Positive for chest pain. Negative for palpitations and leg swelling.  Gastrointestinal: Positive for abdominal pain and constipation. Negative for nausea, vomiting, diarrhea, blood in stool, abdominal distention and anal bleeding.  Neurological: Negative for dizziness, weakness and light-headedness.       Objective:   Physical Exam  Constitutional: She is oriented to person, place, and time. She appears well-developed and well-nourished. No distress.  HENT:  Head: Normocephalic.  Mouth/Throat: Oropharynx is clear and moist. No oropharyngeal exudate.  Eyes: Conjunctivae are normal. No scleral icterus.  Neck: Normal range of motion. Neck supple. No JVD present. No thyromegaly present.  Cardiovascular: Normal rate, regular rhythm, normal heart sounds and intact distal pulses.   Exam reveals no gallop and no friction rub.   No murmur heard. Pulmonary/Chest: Effort normal and breath sounds normal. No respiratory distress. She has no wheezes. She has no rales. She exhibits tenderness.    Abdominal: Soft. Bowel sounds are normal. She exhibits no distension and no mass. There is tenderness (suprapubic which is normal per patients report). There is no rebound and no guarding.  Musculoskeletal: Normal range of motion.  Lymphadenopathy:    She has no cervical adenopathy.  Neurological: She is alert and oriented to person, place, and time.  Skin: Skin is warm and dry. She is not diaphoretic.  Psychiatric: She has a normal mood and affect. Her behavior is normal. Judgment and thought content normal.  Nursing note and vitals reviewed.         Assessment & Plan:    1. Chest pain, unspecified chest pain type -Patient low risk.  EKG without ST changes.  NSR here in office.  PERC negative.  HEART score likely 1-2 with very low risk factor of cardiac events.  Chest pain reproducible on palpation.  Patient instructed that if she has worsening chest pain that she should go to the ER.  She stated understanding  - CBC with Differential/Platelet - BASIC METABOLIC PANEL WITH GFR - Troponin I -take celebrex which was renewed -take norco prn per pain management prescription.

## 2014-10-03 NOTE — Patient Instructions (Signed)

## 2014-10-04 LAB — TROPONIN I: Troponin I: <0.01 ng/mL (ref <0.06)

## 2014-10-04 NOTE — Addendum Note (Signed)
Addended by: Alexandra Lipps A on: 10/04/2014 01:26 PM   Modules accepted: Orders

## 2014-10-16 ENCOUNTER — Ambulatory Visit: Payer: Self-pay | Admitting: Internal Medicine

## 2014-10-16 DIAGNOSIS — M549 Dorsalgia, unspecified: Secondary | ICD-10-CM | POA: Diagnosis not present

## 2014-10-16 DIAGNOSIS — M47817 Spondylosis without myelopathy or radiculopathy, lumbosacral region: Secondary | ICD-10-CM | POA: Diagnosis not present

## 2014-10-16 DIAGNOSIS — M5137 Other intervertebral disc degeneration, lumbosacral region: Secondary | ICD-10-CM | POA: Diagnosis not present

## 2014-10-16 DIAGNOSIS — M488X6 Other specified spondylopathies, lumbar region: Secondary | ICD-10-CM | POA: Diagnosis not present

## 2014-10-17 ENCOUNTER — Ambulatory Visit (INDEPENDENT_AMBULATORY_CARE_PROVIDER_SITE_OTHER): Payer: 59 | Admitting: Physician Assistant

## 2014-10-17 ENCOUNTER — Encounter: Payer: Self-pay | Admitting: Internal Medicine

## 2014-10-17 VITALS — BP 112/68 | HR 64 | Temp 97.2°F | Resp 16 | Ht 65.0 in | Wt 150.6 lb

## 2014-10-17 DIAGNOSIS — E559 Vitamin D deficiency, unspecified: Secondary | ICD-10-CM

## 2014-10-17 DIAGNOSIS — B372 Candidiasis of skin and nail: Secondary | ICD-10-CM

## 2014-10-17 DIAGNOSIS — M2550 Pain in unspecified joint: Secondary | ICD-10-CM

## 2014-10-17 DIAGNOSIS — Z79899 Other long term (current) drug therapy: Secondary | ICD-10-CM

## 2014-10-17 DIAGNOSIS — R5383 Other fatigue: Secondary | ICD-10-CM

## 2014-10-17 DIAGNOSIS — R7309 Other abnormal glucose: Secondary | ICD-10-CM | POA: Diagnosis not present

## 2014-10-17 DIAGNOSIS — R7303 Prediabetes: Secondary | ICD-10-CM

## 2014-10-17 DIAGNOSIS — E782 Mixed hyperlipidemia: Secondary | ICD-10-CM

## 2014-10-17 MED ORDER — VITAMIN D (ERGOCALCIFEROL) 1.25 MG (50000 UNIT) PO CAPS: ORAL_CAPSULE | ORAL | Status: DC

## 2014-10-17 NOTE — Progress Notes (Signed)
Assessment and Plan: Vitamin D- will send in prescription Myalgias- add magnesium Yeast infection due to excess skin- will refer to plastic surgeon.  Fatigue- ? From depression/medications- labs normal, follow up 2-3 months.   Follow up 3 months  HPI 44 y.o.female presents for 1 month follow up. She did not get her labs so she did not have any changes in her medications. She continues to have fatigue, has also had an irregular cycle this month, and then started spotting for 3 days.   Excess skin from child birth with c section, she gets yeast infections occ and would like to see a Psychiatric nurse for this.   Past Medical History  Diagnosis Date  . Allergy   . Uterine polyp   . Asthma   . Seasonal allergies   . GERD (gastroesophageal reflux disease)   . 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 CMT (Charcot-Marie-Tooth disease)   . Wears glasses   . Chronic constipation      Allergies  Allergen Reactions  . Oxycontin [Oxycodone Hcl] Nausea And Vomiting  . Penicillins   . Penicillins Hives  . Sulfa Antibiotics   . Sulfa Antibiotics Hives      Current Outpatient Prescriptions on File Prior to Visit  Medication Sig Dispense Refill  . albuterol (PROAIR HFA) 108 (90 BASE) MCG/ACT inhaler Inhale 2 puffs into the lungs every 6 (six) hours as needed for wheezing or shortness of breath.    . celecoxib (CELEBREX) 200 MG capsule Take 1 capsule (200 mg total) by mouth daily. 30 capsule 0  . cetirizine (ZYRTEC) 10 MG tablet Take 10 mg by mouth every evening.    . fluticasone (CUTIVATE) 0.05 % cream Apply 1 application topically 2 (two) times daily.     . Fluticasone-Salmeterol (ADVAIR DISKUS) 250-50 MCG/DOSE AEPB Inhale 1 puff into the lungs every 12 (twelve) hours.    . gabapentin (NEURONTIN) 800 MG tablet Take 800 mg by mouth 3 (three) times daily.    . Ginger 500 MG CAPS Take 1 capsule by mouth daily.    Marland Kitchen  HYDROcodone-acetaminophen (NORCO/VICODIN) 5-325 MG per tablet Take 1 tablet by mouth 2 (two) times daily.    . hyoscyamine (LEVSIN/SL) 0.125 MG SL tablet Place 1 tablet (0.125 mg total) under the tongue 3 (three) times daily as needed for cramping. 90 tablet 2  . Linaclotide (LINZESS) 290 MCG CAPS capsule Take 1 capsule (290 mcg total) by mouth daily. 90 capsule 3  . mirtazapine (REMERON) 45 MG tablet Take 45 mg by mouth at bedtime.    . montelukast (SINGULAIR) 10 MG tablet Take 10 mg by mouth at bedtime.    . Olopatadine HCl (PATADAY) 0.2 % SOLN Apply to eye.    Marland Kitchen OVER THE COUNTER MEDICATION Fiber Choice 2 tabs daily    . tranexamic acid (LYSTEDA) 650 MG TABS tablet Take 650 mg by mouth 3 (three) times daily. Patient takes 2 tabs 2-3 times daily for menses pain.     No current facility-administered medications on file prior to visit.    ROS: all negative except above.   Physical Exam: Filed Weights   10/17/14 1657  Weight: 150 lb 9.6 oz (68.312 kg)   BP 112/68 mmHg  Pulse 64  Temp(Src) 97.2 F (36.2 C)  Resp 16  Ht 5\' 5"  (1.651 m)  Wt 150 lb 9.6 oz (68.312 kg)  BMI 25.06 kg/m2  LMP 09/12/2014 General Appearance: Well nourished,  in no apparent distress. Eyes: PERRLA, EOMs, conjunctiva no swelling or erythema Sinuses: No Frontal/maxillary tenderness ENT/Mouth: Ext aud canals clear, TMs without erythema, bulging. No erythema, swelling, or exudate on post pharynx.  Tonsils not swollen or erythematous. Hearing normal.  Neck: Supple, thyroid normal.  Respiratory: Respiratory effort normal, BS equal bilaterally without rales, rhonchi, wheezing or stridor.  Cardio: RRR with no MRGs. Brisk peripheral pulses without edema.  Abdomen: Soft, horizontal well healing scar suprapubic with excess skin and rash, + BS,  Non tender, no guarding, rebound, hernias, masses. Lymphatics: Non tender without lymphadenopathy.  Musculoskeletal: Full ROM, 5/5 strength, normal gait.  Skin: Warm, dry without  rashes, lesions, ecchymosis.  Neuro: Cranial nerves intact. Normal muscle tone, no cerebellar symptoms. Sensation intact.  Psych: Awake and oriented X 3, normal affect, Insight and Judgment appropriate.     Vicie Mutters, PA-C 5:31 PM [facility]

## 2014-10-17 NOTE — Patient Instructions (Signed)
  Vitamin D goal is between 60-80  Please make sure that you are taking your Vitamin D as directed.   It is very important as a natural anti-inflammatory   helping hair, skin, and nails, as well as reducing stroke and heart attack risk.   It helps your bones and helps with mood.  It also decreases numerous cancer risks so please take it as directed.   Low Vit D is associated with a 200-300% higher risk for CANCER   and 200-300% higher risk for HEART   ATTACK  &  STROKE.    .....................................Marland Kitchen  It is also associated with higher death rate at younger ages,   autoimmune diseases like Rheumatoid arthritis, Lupus, Multiple Sclerosis.     Also many other serious conditions, like depression, Alzheimer's  Dementia, infertility, muscle aches, fatigue, fibromyalgia - just to name a few.  +++++++++++++++++++   Add magnesium   Benefiber is good for constipation/diarrhea/irritable bowel syndrome, it helps with weight loss and can help lower your bad cholesterol. Please do 1-2 TBSP in the morning in water, coffee, or tea. It can take up to a month before you can see a difference with your bowel movements. It is cheapest from [employer], [employer], [employer].

## 2014-10-24 NOTE — Addendum Note (Signed)
Addended by: Vladimir Crofts on: 10/24/2014 08:58 AM   Modules accepted: Orders

## 2014-11-01 DIAGNOSIS — M542 Cervicalgia: Secondary | ICD-10-CM | POA: Diagnosis not present

## 2014-11-07 ENCOUNTER — Other Ambulatory Visit: Payer: Self-pay | Admitting: Internal Medicine

## 2014-11-13 DIAGNOSIS — M5137 Other intervertebral disc degeneration, lumbosacral region: Secondary | ICD-10-CM | POA: Diagnosis not present

## 2014-11-13 DIAGNOSIS — M542 Cervicalgia: Secondary | ICD-10-CM | POA: Diagnosis not present

## 2014-11-13 DIAGNOSIS — M488X6 Other specified spondylopathies, lumbar region: Secondary | ICD-10-CM | POA: Diagnosis not present

## 2014-11-13 DIAGNOSIS — M47817 Spondylosis without myelopathy or radiculopathy, lumbosacral region: Secondary | ICD-10-CM | POA: Diagnosis not present

## 2014-11-26 ENCOUNTER — Other Ambulatory Visit: Payer: Self-pay | Admitting: Pain Medicine

## 2014-11-26 DIAGNOSIS — M542 Cervicalgia: Secondary | ICD-10-CM

## 2014-11-29 ENCOUNTER — Ambulatory Visit
Admission: RE | Admit: 2014-11-29 | Discharge: 2014-11-29 | Disposition: A | Discharge status: Home / Self Care | Payer: 59 | Source: Ambulatory Visit | Attending: Pain Medicine | Admitting: Pain Medicine

## 2014-11-29 DIAGNOSIS — M542 Cervicalgia: Secondary | ICD-10-CM

## 2015-01-30 ENCOUNTER — Encounter: Payer: Self-pay | Admitting: Physician Assistant

## 2015-01-30 ENCOUNTER — Ambulatory Visit (INDEPENDENT_AMBULATORY_CARE_PROVIDER_SITE_OTHER): Payer: 59 | Admitting: Physician Assistant

## 2015-01-30 VITALS — BP 120/64 | HR 64 | Temp 97.7°F | Resp 16 | Ht 65.0 in | Wt 147.0 lb

## 2015-01-30 DIAGNOSIS — M26609 Unspecified temporomandibular joint disorder, unspecified side: Secondary | ICD-10-CM | POA: Diagnosis not present

## 2015-01-30 DIAGNOSIS — G600 Hereditary motor and sensory neuropathy: Secondary | ICD-10-CM | POA: Diagnosis not present

## 2015-01-30 DIAGNOSIS — M2550 Pain in unspecified joint: Secondary | ICD-10-CM

## 2015-01-30 DIAGNOSIS — E782 Mixed hyperlipidemia: Secondary | ICD-10-CM

## 2015-01-30 DIAGNOSIS — K219 Gastro-esophageal reflux disease without esophagitis: Secondary | ICD-10-CM

## 2015-01-30 DIAGNOSIS — R7303 Prediabetes: Secondary | ICD-10-CM | POA: Diagnosis not present

## 2015-01-30 DIAGNOSIS — J4520 Mild intermittent asthma, uncomplicated: Secondary | ICD-10-CM | POA: Diagnosis not present

## 2015-01-30 DIAGNOSIS — Z0000 Encounter for general adult medical examination without abnormal findings: Secondary | ICD-10-CM

## 2015-01-30 DIAGNOSIS — Z0001 Encounter for general adult medical examination with abnormal findings: Secondary | ICD-10-CM | POA: Diagnosis not present

## 2015-01-30 DIAGNOSIS — Z79899 Other long term (current) drug therapy: Secondary | ICD-10-CM

## 2015-01-30 DIAGNOSIS — Z23 Encounter for immunization: Secondary | ICD-10-CM | POA: Diagnosis not present

## 2015-01-30 DIAGNOSIS — R1031 Right lower quadrant pain: Secondary | ICD-10-CM

## 2015-01-30 DIAGNOSIS — G8929 Other chronic pain: Secondary | ICD-10-CM

## 2015-01-30 DIAGNOSIS — R5383 Other fatigue: Secondary | ICD-10-CM | POA: Diagnosis not present

## 2015-01-30 DIAGNOSIS — R6889 Other general symptoms and signs: Secondary | ICD-10-CM

## 2015-01-30 DIAGNOSIS — E559 Vitamin D deficiency, unspecified: Secondary | ICD-10-CM | POA: Diagnosis not present

## 2015-01-30 DIAGNOSIS — K5900 Constipation, unspecified: Secondary | ICD-10-CM

## 2015-01-30 LAB — CBC WITH DIFFERENTIAL/PLATELET
Basophils Absolute: 0 10*3/uL (ref 0.0–0.1)
Basophils Relative: 1 % (ref 0–1)
Eosinophils Absolute: 0 10*3/uL (ref 0.0–0.7)
Eosinophils Relative: 1 % (ref 0–5)
HCT: 38.2 % (ref 36.0–46.0)
Hemoglobin: 12.2 g/dL (ref 12.0–15.0)
Lymphocytes Relative: 41 % (ref 12–46)
Lymphs Abs: 1.4 10*3/uL (ref 0.7–4.0)
MCH: 26 pg (ref 26.0–34.0)
MCHC: 31.9 g/dL (ref 30.0–36.0)
MCV: 81.4 fL (ref 78.0–100.0)
MPV: 9.2 fL (ref 8.6–12.4)
Monocytes Absolute: 0.3 10*3/uL (ref 0.1–1.0)
Monocytes Relative: 9 % (ref 3–12)
Neutro Abs: 1.7 10*3/uL (ref 1.7–7.7)
Neutrophils Relative %: 48 % (ref 43–77)
Platelets: 394 10*3/uL (ref 150–400)
RBC: 4.69 MIL/uL (ref 3.87–5.11)
RDW: 14.8 % (ref 11.5–15.5)
WBC: 3.5 10*3/uL — ABNORMAL LOW (ref 4.0–10.5)

## 2015-01-30 LAB — BASIC METABOLIC PANEL WITH GFR
BUN: 9 mg/dL (ref 7–25)
CO2: 29 mmol/L (ref 20–31)
Calcium: 9.3 mg/dL (ref 8.6–10.2)
Chloride: 103 mmol/L (ref 98–110)
Creat: 0.75 mg/dL (ref 0.50–1.10)
GFR, Est African American: >89 mL/min (ref >=60)
GFR, Est Non African American: >89 mL/min (ref >=60)
Glucose, Bld: 88 mg/dL (ref 65–99)
Potassium: 4.3 mmol/L (ref 3.5–5.3)
Sodium: 137 mmol/L (ref 135–146)

## 2015-01-30 LAB — LIPID PANEL
Cholesterol: 187 mg/dL (ref 125–200)
HDL: 64 mg/dL (ref >=46)
LDL Cholesterol: 111 mg/dL (ref <130)
Total CHOL/HDL Ratio: 2.9 Ratio (ref <=5.0)
Triglycerides: 62 mg/dL (ref <150)
VLDL: 12 mg/dL (ref <30)

## 2015-01-30 LAB — HEPATIC FUNCTION PANEL
ALT: 13 U/L (ref 6–29)
AST: 20 U/L (ref 10–35)
Albumin: 4.1 g/dL (ref 3.6–5.1)
Alkaline Phosphatase: 65 U/L (ref 33–115)
Bilirubin, Direct: 0.1 mg/dL (ref <=0.2)
Indirect Bilirubin: 0.3 mg/dL (ref 0.2–1.2)
Total Bilirubin: 0.4 mg/dL (ref 0.2–1.2)
Total Protein: 6.7 g/dL (ref 6.1–8.1)

## 2015-01-30 LAB — TSH: TSH: 1.44 u[IU]/mL (ref 0.350–4.500)

## 2015-01-30 LAB — MAGNESIUM: Magnesium: 1.8 mg/dL (ref 1.5–2.5)

## 2015-01-30 MED ORDER — TOPIRAMATE 25 MG PO TABS: ORAL_TABLET | ORAL | Status: DC

## 2015-01-30 NOTE — Progress Notes (Signed)
MEDICARE ANNUAL WELLNESS VISIT AND FOLLOW UP  Assessment:   1. abnormal glucose - CBC with Differential/Platelet - BASIC METABOLIC PANEL WITH GFR - Hepatic function panel - TSH - Lipid panel  2. Mixed hyperlipidemia - TSH - Lipid panel  3. Other fatigue - TSH  4. Vitamin D deficiency - Vit D  25 hydroxy (rtn osteoporosis monitoring)  5. Medication management - Magnesium  6. TMJ (temporomandibular joint syndrome) information given to the patient, no gum/decrease hard foods, warm wet wash clothes, decrease stress, talk with dentist about possible night guard, can do massage, and exercise. Talk with dentist about mouth piece  7. Need for prophylactic vaccination with combined diphtheria-tetanus-pertussis (DTP) vaccine Needs to check with insurance  8. CMT (Charcot-Marie-Tooth disease) Wants referral to different neuro, wants someone at [facility] - Ambulatory referral to Neurology  9. Pain in joint, multiple sites Follows pain management - Has failed cymablta, would like to try topamax 25mg , 1 at night and can increase to 2.  Would also like to try long acting gabapentin, horizant 600mg .   10. Abdominal pain, chronic, right lower quadrant Normal work up, monitor  11. Gastroesophageal reflux disease, esophagitis presence not specified Continue PPI/H2 blocker, diet discussed  12. Constipation, unspecified constipation type secondary to opoid, controlled with fiber/water, consider movantik  13. Asthma, chronic, mild intermittent, uncomplicated controlled  Over 30 minutes of exam, counseling, chart review, and critical decision making was performed  Plan:   During the course of the visit the patient was educated and counseled about appropriate screening and preventive services including:    Pneumococcal vaccine   Influenza vaccine  Td vaccine  Prevnar 13  Screening electrocardiogram  Screening mammography  Bone densitometry screening  Colorectal cancer  screening  Diabetes screening  Glaucoma screening  Nutrition counseling   Advanced directives: given info/requested copies  Conditions/risks identified: Diabetes is at goal, ACE/ARB therapy: No, Reason not on Ace Inhibitor/ARB therapy:  PreDM Urinary Incontinence is not an issue: discussed non pharmacology and pharmacology options.  Fall risk: low- discussed PT, home fall assessment, medications.    Subjective:   Brandi Erickson is a 44 y.o. female who presents for Medicare Annual Wellness Visit and 3 month follow up on hypertension, prediabetes, hyperlipidemia, vitamin D def.  Date of last medicare wellness visit is unknown.    Her blood pressure has been controlled at home, today their BP is    She does not workout. She denies chest pain, shortness of breath, dizziness. She has chronic pain, follows with Dr. Leone Brand. Doran Durand and follows at preferred pain management, Dr. Andree Elk. Had EMG studies. Could not tolerate cymbalta.  She has been having neck pain, bilateral shoulder pain, and HA's, had MRI that was normal. Having frontal sinus pain every other day with ears popping.  Denies dizziness, loss of hearing. Has confirmed TMJ from dentist, would benefit from mouth piece.   She is not on cholesterol medication and denies myalgias. Her cholesterol is at goal. The cholesterol last visit was:   Lab Results  Component Value Date   CHOL 182 09/11/2014   HDL 54 09/11/2014   LDLCALC 110* 09/11/2014   TRIG 92 09/11/2014   CHOLHDL 3.4 09/11/2014    She has been working on diet and exercise for prediabetes, and denies paresthesia of the feet, polydipsia, polyuria and visual disturbances. Last A1C in the office was:  Lab Results  Component Value Date   HGBA1C 5.7* 09/11/2014   Patient is on Vitamin D supplement, started on RX,  50,000 IU 1 pill twice a week.    Lab Results  Component Value Date   VD25OH 25* 09/11/2014      Medication Review Current Outpatient Prescriptions on File  Prior to Visit  Medication Sig Dispense Refill  . albuterol (PROAIR HFA) 108 (90 BASE) MCG/ACT inhaler Inhale 2 puffs into the lungs every 6 (six) hours as needed for wheezing or shortness of breath.    . celecoxib (CELEBREX) 200 MG capsule TAKE 1 CAPSULE BY MOUTH DAILY. 30 capsule 2  . cetirizine (ZYRTEC) 10 MG tablet Take 10 mg by mouth every evening.    . fluticasone (CUTIVATE) 0.05 % cream Apply 1 application topically 2 (two) times daily.     . Fluticasone-Salmeterol (ADVAIR DISKUS) 250-50 MCG/DOSE AEPB Inhale 1 puff into the lungs every 12 (twelve) hours.    . gabapentin (NEURONTIN) 800 MG tablet Take 800 mg by mouth 3 (three) times daily.    . Ginger 500 MG CAPS Take 1 capsule by mouth daily.    Marland Kitchen HYDROcodone-acetaminophen (NORCO/VICODIN) 5-325 MG per tablet Take 1 tablet by mouth 2 (two) times daily.    . hyoscyamine (LEVSIN/SL) 0.125 MG SL tablet Place 1 tablet (0.125 mg total) under the tongue 3 (three) times daily as needed for cramping. 90 tablet 2  . Linaclotide (LINZESS) 290 MCG CAPS capsule Take 1 capsule (290 mcg total) by mouth daily. 90 capsule 3  . mirtazapine (REMERON) 45 MG tablet Take 45 mg by mouth at bedtime.    . montelukast (SINGULAIR) 10 MG tablet Take 10 mg by mouth at bedtime.    . Olopatadine HCl (PATADAY) 0.2 % SOLN Apply to eye.    Marland Kitchen OVER THE COUNTER MEDICATION Fiber Choice 2 tabs daily    . tranexamic acid (LYSTEDA) 650 MG TABS tablet Take 650 mg by mouth 3 (three) times daily. Patient takes 2 tabs 2-3 times daily for menses pain.    . Vitamin D, Ergocalciferol, (DRISDOL) 50000 UNITS CAPS capsule 1 pill 2 times a week 30 capsule 2   No current facility-administered medications on file prior to visit.    Current Problems (verified) Patient Active Problem List   Diagnosis Date Noted  . Mixed hyperlipidemia 09/13/2014  . Prediabetes 09/13/2014  . Other fatigue 09/11/2014  . Vitamin D deficiency 09/11/2014  . Pain in joint, multiple sites 09/11/2014  .  Medication management 09/11/2014  . Abdominal pain, chronic, right lower quadrant 10/11/2012  . GERD 08/14/2012  . Asthma, chronic 08/14/2012  . CMT (Charcot-Marie-Tooth disease) 08/14/2012  . Unspecified constipation 08/14/2012    Screening Tests  There is no immunization history on file for this patient.  Preventative care: Last colonoscopy: N/A EGD: 2013 Last mammogram: 04/2014 Last pap smear/pelvic exam: OB/GYN 2015 DEXA:N/A Ct AB 2015  Prior vaccinations: TD or Tdap: DUE but declines until talks with insurance about cost  Influenza: declines Pneumococcal: N/A Prevnar13: N/A Shingles/Zostavax: N/A  Names of Other Physician/Practitioners you currently use: 1. [facility]- here for primary care 2. Dr. Katy Fitch,  eye doctor, last visit  3. [facility], Dr. Posey Pronto, dentist, last visit  Patient Care Team: Unk Pinto, MD as PCP - General (Internal Medicine) Wylene Simmer, MD as Consulting Physician (Orthopedic Surgery) Susa Day, MD as Consulting Physician (Orthopedic Surgery) Rogene Houston, MD as Consulting Physician (Gastroenterology)  Past Surgical History  Procedure Laterality Date  . Esophageal manometry  05/17/2011    Procedure: ESOPHAGEAL MANOMETRY (EM);  Surgeon: Beryle Beams, MD;  Location: [facility];  Service: Endoscopy;  Laterality: N/A;  . Tonsillectomy    . Tubal reconstruction  1997  . Tubal ligation  1999  . Dilation and curettage of uterus    . Colonoscopy N/A 11/01/2012    Procedure: COLONOSCOPY;  Surgeon: Rogene Houston, MD;  Location: [facility];  Service: Endoscopy;  Laterality: N/A;  130  . Foot neuroma surgery Right 06-05-2013  . Tonsillectomy  as child  . Hysteroscopy w/d&c N/A 06/27/2013    Procedure: HYSTEROSCOPY POLPYECTOMY  ;  Surgeon: Governor Specking, MD;  Location: [facility];  Service: Gynecology;  Laterality: N/A;  . Laparoscopy N/A 06/27/2013    Procedure: LAPAROSCOPY  WITH extensive lysis of adhesions;  Surgeon: Governor Specking, MD;  Location: [place];  Service: Gynecology;  Laterality: N/A;   No family history on file. Social History  Substance Use Topics  . Smoking status: Never Smoker   . Smokeless tobacco: Never Used  . Alcohol Use: No    Review of Systems:  Review of Systems  Constitutional: Positive for malaise/fatigue. Negative for fever, chills, weight loss and diaphoresis.  HENT: Positive for congestion. Negative for ear discharge, ear pain, hearing loss, nosebleeds, sore throat and tinnitus.   Eyes: Negative.   Respiratory: Negative for cough, hemoptysis, sputum production, shortness of breath, wheezing and stridor.   Cardiovascular: Negative for chest pain, palpitations, orthopnea, claudication and leg swelling.  Gastrointestinal: Positive for abdominal pain and constipation. Negative for heartburn, nausea, vomiting, diarrhea, blood in stool and melena.  Genitourinary: Negative.   Musculoskeletal: Positive for myalgias, back pain, joint pain and neck pain. Negative for falls.  Skin: Negative.  Negative for rash.  Neurological: Positive for headaches. Negative for dizziness, tingling, tremors, sensory change, speech change, focal weakness, seizures, loss of consciousness and weakness.  Psychiatric/Behavioral: Negative.  Negative for depression and memory loss. The patient does not have insomnia.      MEDICARE WELLNESS OBJECTIVES: Tobacco use: She does not smoke.  Patient is not a former smoker. If yes, counseling given Alcohol Current alcohol use: none Osteoporosis: dietary calcium and/or vitamin D deficiency, History of fracture in the past year: no Fall risk: Low Risk Hearing: normal Visual acuity: normal,  does perform annual eye exam Diet: in general, a "healthy" diet   Physical activity: Current Exercise Habits:: The patient does not participate in regular exercise at present Cardiac risk factors: Cardiac Risk  Factors include: dyslipidemia;hypertension;sedentary lifestyle Depression/mood screen:   Depression screen [facility] 2/9 01/30/2015  Decreased Interest 0  Down, Depressed, Hopeless 0  PHQ - 2 Score 0    ADLs:  In your present state of health, do you have any difficulty performing the following activities: 01/30/2015  Hearing? N  Vision? N  Difficulty concentrating or making decisions? N  Walking or climbing stairs? Y  Dressing or bathing? N  Doing errands, shopping? N  Preparing Food and eating ? N  Using the Toilet? N  In the past six months, have you accidently leaked urine? N  Do you have problems with loss of bowel control? N  Managing your Medications? N  Managing your Finances? N  Housekeeping or managing your Housekeeping? N     Cognitive Testing  Alert? Yes  Normal Appearance?Yes  Oriented to person? Yes  Place? Yes   Time? Yes  Recall of three objects?  Yes  Can perform simple calculations? Yes  Displays appropriate judgment?Yes  Can read the correct time from a watch face?Yes  EOL  planning: Does patient have an advance directive?: No Would patient like information on creating an advanced directive?: Yes - Educational materials given   Objective:   Today's Vitals   01/30/15 0927  BP: 120/64  Pulse: 64  Temp: 97.7 F (36.5 C)  TempSrc: Temporal  Resp: 16  Height: 5\' 5"  (1.651 m)  Weight: 147 lb (66.679 kg)  SpO2: 99%   Body mass index is 24.46 kg/(m^2).  General appearance: alert, no distress, WD/WN,  female HEENT: normocephalic, sclerae anicteric, TMs pearly, nares patent, no discharge or erythema, pharynx normal Oral cavity: MMM, no lesions Neck: supple, no lymphadenopathy, no thyromegaly, no masses Heart: RRR, normal S1, S2, no murmurs Lungs: CTA bilaterally, no wheezes, rhonchi, or rales Abdomen: +bs, soft, non tender, non distended, no masses, no hepatomegaly, no splenomegaly Musculoskeletal: nontender, no swelling, no obvious deformity Extremities: no  edema, no cyanosis, no clubbing Pulses: 2+ symmetric, upper and lower extremities, normal cap refill Neurological: alert, oriented x 3, CN2-12 intact, strength normal upper extremities and lower extremities, sensation normal throughout, DTRs 2+ throughout, no cerebellar signs, gait normal Psychiatric: normal affect, behavior normal, pleasant  Breast: defer Gyn: defer Rectal: defer   Medicare Attestation I have personally reviewed: The patient's medical and social history Their use of alcohol, tobacco or illicit drugs Their current medications and supplements The patient's functional ability including ADLs,fall risks, home safety risks, cognitive, and hearing and visual impairment Diet and physical activities Evidence for depression or mood disorders  The patient's weight, height, BMI, and visual acuity have been recorded in the chart.  I have made referrals, counseling, and provided education to the patient based on review of the above and I have provided the patient with a written personalized care plan for preventive services.     Vicie Mutters, PA-C   01/30/2015

## 2015-01-30 NOTE — Patient Instructions (Addendum)
Start on topamax 1/2-1 pill at night for 3-5 nights, then can increase slowly.    What is the TMJ? The temporomandibular (tem-PUH-ro-man-DIB-yoo-ler) joint, or the TMJ, connects the upper and lower jawbones. This joint allows the jaw to open wide and move back and forth when you chew, talk, or yawn.There are also several muscles that help this joint move. There can be muscle tightness and pain in the muscle that can cause several symptoms.  What causes TMJ pain? There are many causes of TMJ pain. Repeated chewing (for example, chewing gum) and clenching your teeth can cause pain in the joint. Some TMJ pain has no obvious cause. What can I do to ease the pain? There are many things you can do to help your pain get better. When you have pain:  Eat soft foods and stay away from chewy foods (for example, taffy) Try to use both sides of your mouth to chew Don't chew gum Massage Don't open your mouth wide (for example, during yawning or singing) Don't bite your cheeks or fingernails Lower your amount of stress and worry Applying a warm, damp washcloth to the joint may help. Over-the-counter pain medicines such as ibuprofen (one brand: Advil) or acetaminophen (one brand: Tylenol) might also help. Do not use these medicines if you are allergic to them or if your doctor told you not to use them. How can I stop the pain from coming back? When your pain is better, you can do these exercises to make your muscles stronger and to keep the pain from coming back:  Resisted mouth opening: Place your thumb or two fingers under your chin and open your mouth slowly, pushing up lightly on your chin with your thumb. Hold for three to six seconds. Close your mouth slowly. Resisted mouth closing: Place your thumbs under your chin and your two index fingers on the ridge between your mouth and the bottom of your chin. Push down lightly on your chin as you close your mouth. Tongue up: Slowly open and close your mouth  while keeping the tongue touching the roof of the mouth. Side-to-side jaw movement: Place an object about one fourth of an inch thick (for example, two tongue depressors) between your front teeth. Slowly move your jaw from side to side. Increase the thickness of the object as the exercise becomes easier Forward jaw movement: Place an object about one fourth of an inch thick between your front teeth and move the bottom jaw forward so that the bottom teeth are in front of the top teeth. Increase the thickness of the object as the exercise becomes easier. These exercises should not be painful. If it hurts to do these exercises, stop doing them and talk to your family doctor.

## 2015-01-31 LAB — VITAMIN D 25 HYDROXY (VIT D DEFICIENCY, FRACTURES): Vit D, 25-Hydroxy: 71 ng/mL (ref 30–100)

## 2015-02-03 ENCOUNTER — Ambulatory Visit: Payer: Self-pay | Admitting: Physician Assistant

## 2015-02-04 ENCOUNTER — Ambulatory Visit (INDEPENDENT_AMBULATORY_CARE_PROVIDER_SITE_OTHER): Payer: 59 | Admitting: Physician Assistant

## 2015-02-04 ENCOUNTER — Encounter: Payer: Self-pay | Admitting: Physician Assistant

## 2015-02-04 VITALS — BP 124/72 | HR 68 | Temp 97.7°F | Resp 16 | Ht 65.0 in | Wt 151.0 lb

## 2015-02-04 DIAGNOSIS — J0100 Acute maxillary sinusitis, unspecified: Secondary | ICD-10-CM

## 2015-02-04 MED ORDER — BENZONATATE 100 MG PO CAPS: 100.0000 mg | ORAL_CAPSULE | Freq: Three times a day (TID) | ORAL | Status: DC | PRN

## 2015-02-04 MED ORDER — AZITHROMYCIN 250 MG PO TABS: ORAL_TABLET | ORAL | Status: AC

## 2015-02-04 MED ORDER — PREDNISONE 20 MG PO TABS: ORAL_TABLET | ORAL | Status: DC

## 2015-02-04 NOTE — Progress Notes (Signed)
Subjective:    Patient ID: Brandi Erickson, female    DOB: [birth year], 44 y.o.   MRN: [id number]  HPI 44 y.o. AAF presents with cold symptoms. She states she took topamax and had hives and shortness of breath, has not taken topamax again since that first time. Symptoms include congestion, cough described as nonproductive, low grade fever, non productive cough, sinus pressure and sore throat. Onset of symptoms was 5 days ago, and has been gradually worsening since that time. Treatment to date: antihistamines and breathing treatments at home.   Blood pressure 124/72, pulse 68, temperature 97.7 F (36.5 C), temperature source Temporal, resp. rate 16, height 5\' 5"  (1.651 m), weight 151 lb (68.493 kg), last menstrual period 01/19/2015, SpO2 99 %.  Current Outpatient Prescriptions on File Prior to Visit  Medication Sig Dispense Refill  . albuterol (PROAIR HFA) 108 (90 BASE) MCG/ACT inhaler Inhale 2 puffs into the lungs every 6 (six) hours as needed for wheezing or shortness of breath.    . celecoxib (CELEBREX) 200 MG capsule TAKE 1 CAPSULE BY MOUTH DAILY. 30 capsule 2  . cetirizine (ZYRTEC) 10 MG tablet Take 10 mg by mouth every evening.    . fluticasone (CUTIVATE) 0.05 % cream Apply 1 application topically 2 (two) times daily.     . Fluticasone-Salmeterol (ADVAIR DISKUS) 250-50 MCG/DOSE AEPB Inhale 1 puff into the lungs every 12 (twelve) hours.    . gabapentin (NEURONTIN) 800 MG tablet Take 800 mg by mouth 3 (three) times daily.    . Ginger 500 MG CAPS Take 1 capsule by mouth daily.    Marland Kitchen HYDROcodone-acetaminophen (NORCO/VICODIN) 5-325 MG per tablet Take 1 tablet by mouth 2 (two) times daily.    . hyoscyamine (LEVSIN/SL) 0.125 MG SL tablet Place 1 tablet (0.125 mg total) under the tongue 3 (three) times daily as needed for cramping. 90 tablet 2  . Linaclotide (LINZESS) 290 MCG CAPS capsule Take 1 capsule (290 mcg total) by mouth daily. 90 capsule 3  . mirtazapine (REMERON) 45 MG tablet Take 45 mg by  mouth at bedtime.    . montelukast (SINGULAIR) 10 MG tablet Take 10 mg by mouth at bedtime.    . Olopatadine HCl (PATADAY) 0.2 % SOLN Apply to eye.    Marland Kitchen OVER THE COUNTER MEDICATION Fiber Choice 2 tabs daily    . tranexamic acid (LYSTEDA) 650 MG TABS tablet Take 650 mg by mouth 3 (three) times daily. Patient takes 2 tabs 2-3 times daily for menses pain.    . Vitamin D, Ergocalciferol, (DRISDOL) 50000 UNITS CAPS capsule 1 pill 2 times a week 30 capsule 2   No current facility-administered medications on file prior to visit.   Past Medical History  Diagnosis Date  . Allergy   . Uterine polyp   . Asthma   . Seasonal allergies   . GERD (gastroesophageal reflux disease)   . Neuropathy, peripheral (HCC)     upper and lower extremities seconardy to scharcot-marie  tooth disease  . CMT (Charcot-Marie-Tooth disease)   . Wears glasses   . Chronic constipation     Review of Systems  Constitutional: Negative for chills and diaphoresis.  HENT: Positive for congestion, postnasal drip, sinus pressure and sneezing. Negative for ear pain and sore throat.   Respiratory: Positive for cough and wheezing. Negative for apnea, choking, chest tightness, shortness of breath and stridor.   Cardiovascular: Negative.   Gastrointestinal: Negative.   Genitourinary: Negative.   Musculoskeletal: Negative for neck pain.  Neurological:  Positive for headaches.       Objective:   Physical Exam  Constitutional: She appears well-developed and well-nourished.  HENT:  Head: Normocephalic and atraumatic.  Right Ear: External ear normal.  Nose: Right sinus exhibits maxillary sinus tenderness. Right sinus exhibits no frontal sinus tenderness. Left sinus exhibits maxillary sinus tenderness. Left sinus exhibits no frontal sinus tenderness.  Eyes: Conjunctivae and EOM are normal.  Neck: Normal range of motion. Neck supple.  Cardiovascular: Normal rate, regular rhythm, normal heart sounds and intact distal pulses.    Pulmonary/Chest: Effort normal. No respiratory distress. She has wheezes. She has no rales. She exhibits no tenderness.  Abdominal: Soft. Bowel sounds are normal.  Lymphadenopathy:    She has no cervical adenopathy.  Skin: Skin is warm and dry.      Assessment & Plan:  1. Acute maxillary sinusitis, recurrence not specified - azithromycin (ZITHROMAX) 250 MG tablet; Take 2 tablets (500 mg) on  Day 1,  followed by 1 tablet (250 mg) once daily on Days 2 through 5.  Dispense: 6 each; Refill: 1 - predniSONE (DELTASONE) 20 MG tablet; 2 tablets daily for 3 days, 1 tablet daily for 4 days.  Dispense: 10 tablet; Refill: 0 - benzonatate (TESSALON PERLES) 100 MG capsule; Take 1 capsule (100 mg total) by mouth 3 (three) times daily as needed for cough (Max: 600mg  per day (6 capsules per day)).  Dispense: 60 capsule; Refill: 1

## 2015-02-04 NOTE — Patient Instructions (Signed)
Sinusitis can be uncomfortable. People with sinusitis have congestion with yellow/green/gray discharge, sinus pain/pressure, pain around the eyes. Sinus infections almost ALWAYS stem from a viral infection and antibiotics don't work against a virus. Even when bacteria is responsible, the infections usually clear up on their own in a week or so.   PLEASE TRY TO DO OVER THE COUNTER TREATMENT AND PREDNISONE FOR 5-7 DAYS AND IF YOU ARE NOT GETTING BETTER OR GETTING WORSE THEN YOU CAN START ON AN ANTIBIOTIC GIVEN.  Can take the prednisone AT NIGHT WITH DINNER, it take 8-12 hours to start working so it will NOT affect your sleeping if you take it at night with your food!! Take two pills the first night and 1 or two pill the second night and then 1 pill the other nights.   Risk of antibiotic use: About 1 in 4 people who take antibiotics have side effects including stomach problems, dizziness, or rashes. Those problems clear up soon after stopping the drugs, but in rare cases antibiotics can cause severe allergic reaction. Over use of antibiotics also encourages the growth of bacteria that can't be controlled easily with drugs. That makes you more vunerable to antibiotic-resistant infections and undermines the benefits of antibiotics for others.   Waste of Money: Antibiotics often aren't very expensive, but any money spent on unnecessary drugs is money down the drain.   When are antibiotics needed? Only when symptoms last longer than a week.  Start to improve but then worsen again  -It can take up to 2 weeks to feel better.   -If you do not get better in 7-10 days (Have fever, facial pain, dental pain and swelling), then please call the office and it is now appropriate to start an antibiotic.   -Please take Tylenol or Ibuprofen for pain. -Acetaminiphen 325mg orally every 4-6 hours for pain.  Max: 10 per day -Ibuprofen 200mg orally every 6-8 hours for pain.  Take with food to avoid ulcers.   Max 10 per  day  Please pick one of the over the counter allergy medications below and take it once daily for allergies.  Claritin or loratadine cheapest but likely the weakest  Zyrtec or certizine at night because it can make you sleepy The strongest is allegra or fexafinadine  Cheapest at [employer], [employer], [employer]  -While drinking fluids, pinch and hold nose close and swallow.  This will help open up your eustachian tubes to drain the fluid behind your ear drums. -Try steam showers to open your nasal passages.   Drink lots of water to stay hydrated and to thin mucous.  Flonase/Nasonex is to help the inflammation.  Take 2 sprays in each nostril at bedtime.  Make sure you spray towards the outside of each nostril towards the outer corner of your eye, hold nose close and tilt head back.  This will help the medication get into your sinuses.  If you do not like this medication, then use saline nasal sprays same directions as above for Flonase. Stop the medication right away if you get blurring of your vision or nose bleeds.  Sinusitis Sinusitis is redness, soreness, and inflammation of the paranasal sinuses. Paranasal sinuses are air pockets within the bones of your face (beneath the eyes, the middle of the forehead, or above the eyes). In healthy paranasal sinuses, mucus is able to drain out, and air is able to circulate through them by way of your nose. However, when your paranasal sinuses are inflamed, mucus and air can   become trapped. This can allow bacteria and other germs to grow and cause infection. Sinusitis can develop quickly and last only a short time (acute) or continue over a long period (chronic). Sinusitis that lasts for more than 12 weeks is considered chronic.  CAUSES  Causes of sinusitis include: Allergies. Structural abnormalities, such as displacement of the cartilage that separates your nostrils (deviated septum), which can decrease the air flow through your nose and sinuses and affect sinus  drainage. Functional abnormalities, such as when the small hairs (cilia) that line your sinuses and help remove mucus do not work properly or are not present. SIGNS AND SYMPTOMS  Symptoms of acute and chronic sinusitis are the same. The primary symptoms are pain and pressure around the affected sinuses. Other symptoms include: Upper toothache. Earache. Headache. Bad breath. Decreased sense of smell and taste. A cough, which worsens when you are lying flat. Fatigue. Fever. Thick drainage from your nose, which often is green and may contain pus (purulent). Swelling and warmth over the affected sinuses. DIAGNOSIS  Your health care provider will perform a physical exam. During the exam, your health care provider may: Look in your nose for signs of abnormal growths in your nostrils (nasal polyps).  Tap over the affected sinus to check for signs of infection. View the inside of your sinuses (endoscopy) using an imaging device that has a light attached (endoscope). If your health care provider suspects that you have chronic sinusitis, one or more of the following tests may be recommended: Allergy tests. Nasal culture. A sample of mucus is taken from your nose, sent to a lab, and screened for bacteria. Nasal cytology. A sample of mucus is taken from your nose and examined by your health care provider to determine if your sinusitis is related to an allergy. TREATMENT  Most cases of acute sinusitis are related to a viral infection and will resolve on their own within 10 days. Sometimes medicines are prescribed to help relieve symptoms (pain medicine, decongestants, nasal steroid sprays, or saline sprays).  However, for sinusitis related to a bacterial infection, your health care provider will prescribe antibiotic medicines. These are medicines that will help kill the bacteria causing the infection.  Rarely, sinusitis is caused by a fungal infection. In theses cases, your health care provider will  prescribe antifungal medicine. For some cases of chronic sinusitis, surgery is needed. Generally, these are cases in which sinusitis recurs more than 3 times per year, despite other treatments. HOME CARE INSTRUCTIONS  Drink plenty of water. Water helps thin the mucus so your sinuses can drain more easily. Use a humidifier. Inhale steam 3 to 4 times a day (for example, sit in the bathroom with the shower running). Apply a warm, moist washcloth to your face 3 to 4 times a day, or as directed by your health care provider. Use saline nasal sprays to help moisten and clean your sinuses. Take medicines only as directed by your health care provider. If you were prescribed either an antibiotic or antifungal medicine, finish it all even if you start to feel better. SEEK IMMEDIATE MEDICAL CARE IF: You have increasing pain or severe headaches. You have nausea, vomiting, or drowsiness. You have swelling around your face. You have vision problems. You have a stiff neck. You have difficulty breathing. MAKE SURE YOU:  Understand these instructions. Will watch your condition. Will get help right away if you are not doing well or get worse. Document Released: 03/15/2005 Document Revised: 07/30/2013 Document Reviewed: 03/30/2011 ExitCare   Patient Information 2015 [employer]. This information is not intended to replace advice given to you by your health care provider. Make sure you discuss any questions you have with your health care provider.   

## 2015-02-11 ENCOUNTER — Telehealth: Payer: Self-pay | Admitting: Internal Medicine

## 2015-02-11 NOTE — Telephone Encounter (Signed)
Patient follows Dr Andree Elk at [facility]. Patient advises that she discussed with Dr Andree Elk her concern about getting injections and would like to pursue an alternative solution for pain management. She feels the injections will deteriorate her even further, and they are requiring the injections to continue the pain medicine prescription, Hydrocodone. She states Dr Melford Aase reccommended a female doctor at [facility], but she has not been able to see her.  I suggested speaking with the their office manager and explain her request, for possible resolution, scheduled for mid December. I also suggested speaking with Dr Doy Mince,  Neurology at  [facility], who follows her for CMT, for his recommendation, she is scheduled to see him late November. She agreed and will update our practice on the outcome of each conversation. I requested that the patient advise Dr Regenia Skeeter office that we are now her PCP and to please forward the last office note and any future office notes to Dr Melford Aase.   Please advise patient if you have any further recommendations.  Thank you, Leonie Douglas Referral Coordinator  [facility] [phone] [phone] Fax [phone]

## 2015-02-19 ENCOUNTER — Other Ambulatory Visit: Payer: Self-pay | Admitting: Physician Assistant

## 2015-02-19 MED ORDER — PROMETHAZINE-DM 6.25-15 MG/5ML PO SYRP: ORAL_SOLUTION | ORAL | Status: DC

## 2015-03-04 DIAGNOSIS — M5382 Other specified dorsopathies, cervical region: Secondary | ICD-10-CM | POA: Diagnosis not present

## 2015-03-04 DIAGNOSIS — M5030 Other cervical disc degeneration, unspecified cervical region: Secondary | ICD-10-CM | POA: Diagnosis not present

## 2015-03-04 DIAGNOSIS — G894 Chronic pain syndrome: Secondary | ICD-10-CM | POA: Diagnosis not present

## 2015-03-04 DIAGNOSIS — M47812 Spondylosis without myelopathy or radiculopathy, cervical region: Secondary | ICD-10-CM | POA: Diagnosis not present

## 2015-04-01 DIAGNOSIS — M5030 Other cervical disc degeneration, unspecified cervical region: Secondary | ICD-10-CM | POA: Diagnosis not present

## 2015-04-01 DIAGNOSIS — G894 Chronic pain syndrome: Secondary | ICD-10-CM | POA: Diagnosis not present

## 2015-04-01 DIAGNOSIS — M47812 Spondylosis without myelopathy or radiculopathy, cervical region: Secondary | ICD-10-CM | POA: Diagnosis not present

## 2015-04-01 DIAGNOSIS — M5382 Other specified dorsopathies, cervical region: Secondary | ICD-10-CM | POA: Diagnosis not present

## 2015-04-14 ENCOUNTER — Encounter (INDEPENDENT_AMBULATORY_CARE_PROVIDER_SITE_OTHER): Payer: Self-pay | Admitting: Internal Medicine

## 2015-04-25 DIAGNOSIS — J310 Chronic rhinitis: Secondary | ICD-10-CM | POA: Diagnosis not present

## 2015-04-25 DIAGNOSIS — J4530 Mild persistent asthma, uncomplicated: Secondary | ICD-10-CM | POA: Diagnosis not present

## 2015-04-25 DIAGNOSIS — G600 Hereditary motor and sensory neuropathy: Secondary | ICD-10-CM | POA: Diagnosis not present

## 2015-05-07 ENCOUNTER — Ambulatory Visit (INDEPENDENT_AMBULATORY_CARE_PROVIDER_SITE_OTHER): Payer: 59 | Admitting: Internal Medicine

## 2015-05-07 ENCOUNTER — Encounter: Payer: Self-pay | Admitting: Internal Medicine

## 2015-05-07 VITALS — BP 112/72 | HR 60 | Temp 97.3°F | Resp 16 | Ht 65.0 in | Wt 157.0 lb

## 2015-05-07 DIAGNOSIS — Z79899 Other long term (current) drug therapy: Secondary | ICD-10-CM | POA: Diagnosis not present

## 2015-05-07 DIAGNOSIS — E559 Vitamin D deficiency, unspecified: Secondary | ICD-10-CM

## 2015-05-07 DIAGNOSIS — R7303 Prediabetes: Secondary | ICD-10-CM | POA: Diagnosis not present

## 2015-05-07 DIAGNOSIS — E782 Mixed hyperlipidemia: Secondary | ICD-10-CM | POA: Diagnosis not present

## 2015-05-07 DIAGNOSIS — J4520 Mild intermittent asthma, uncomplicated: Secondary | ICD-10-CM | POA: Diagnosis not present

## 2015-05-07 DIAGNOSIS — G600 Hereditary motor and sensory neuropathy: Secondary | ICD-10-CM

## 2015-05-07 DIAGNOSIS — I10 Essential (primary) hypertension: Secondary | ICD-10-CM | POA: Insufficient documentation

## 2015-05-07 DIAGNOSIS — K219 Gastro-esophageal reflux disease without esophagitis: Secondary | ICD-10-CM

## 2015-05-07 DIAGNOSIS — R030 Elevated blood-pressure reading, without diagnosis of hypertension: Secondary | ICD-10-CM

## 2015-05-07 DIAGNOSIS — IMO0001 Reserved for inherently not codable concepts without codable children: Secondary | ICD-10-CM

## 2015-05-07 LAB — CBC WITH DIFFERENTIAL/PLATELET
Basophils Absolute: 0 10*3/uL (ref 0.0–0.1)
Basophils Relative: 0 % (ref 0–1)
Eosinophils Absolute: 0.1 10*3/uL (ref 0.0–0.7)
Eosinophils Relative: 2 % (ref 0–5)
HCT: 38.5 % (ref 36.0–46.0)
Hemoglobin: 12 g/dL (ref 12.0–15.0)
Lymphocytes Relative: 37 % (ref 12–46)
Lymphs Abs: 2 10*3/uL (ref 0.7–4.0)
MCH: 25 pg — ABNORMAL LOW (ref 26.0–34.0)
MCHC: 31.2 g/dL (ref 30.0–36.0)
MCV: 80.2 fL (ref 78.0–100.0)
MPV: 9.2 fL (ref 8.6–12.4)
Monocytes Absolute: 0.4 10*3/uL (ref 0.1–1.0)
Monocytes Relative: 8 % (ref 3–12)
Neutro Abs: 2.9 10*3/uL (ref 1.7–7.7)
Neutrophils Relative %: 53 % (ref 43–77)
Platelets: 389 10*3/uL (ref 150–400)
RBC: 4.8 MIL/uL (ref 3.87–5.11)
RDW: 15.3 % (ref 11.5–15.5)
WBC: 5.5 10*3/uL (ref 4.0–10.5)

## 2015-05-07 LAB — BASIC METABOLIC PANEL WITH GFR
BUN: 11 mg/dL (ref 7–25)
CO2: 26 mmol/L (ref 20–31)
Calcium: 9.5 mg/dL (ref 8.6–10.2)
Chloride: 101 mmol/L (ref 98–110)
Creat: 0.8 mg/dL (ref 0.50–1.10)
GFR, Est African American: >89 mL/min (ref >=60)
GFR, Est Non African American: 89 mL/min (ref >=60)
Glucose, Bld: 75 mg/dL (ref 65–99)
Potassium: 3.9 mmol/L (ref 3.5–5.3)
Sodium: 135 mmol/L (ref 135–146)

## 2015-05-07 LAB — LIPID PANEL
Cholesterol: 197 mg/dL (ref 125–200)
HDL: 67 mg/dL (ref >=46)
LDL Cholesterol: 118 mg/dL (ref <130)
Total CHOL/HDL Ratio: 2.9 Ratio (ref <=5.0)
Triglycerides: 62 mg/dL (ref <150)
VLDL: 12 mg/dL (ref <30)

## 2015-05-07 LAB — HEPATIC FUNCTION PANEL
ALT: 25 U/L (ref 6–29)
AST: 21 U/L (ref 10–35)
Albumin: 4.1 g/dL (ref 3.6–5.1)
Alkaline Phosphatase: 70 U/L (ref 33–115)
Bilirubin, Direct: 0.1 mg/dL (ref <=0.2)
Indirect Bilirubin: 0.3 mg/dL (ref 0.2–1.2)
Total Bilirubin: 0.4 mg/dL (ref 0.2–1.2)
Total Protein: 7.2 g/dL (ref 6.1–8.1)

## 2015-05-07 LAB — TSH: TSH: 2.04 mIU/L

## 2015-05-07 LAB — MAGNESIUM: Magnesium: 1.8 mg/dL (ref 1.5–2.5)

## 2015-05-07 NOTE — Progress Notes (Signed)
Patient ID: Brandi Erickson, female   DOB: [birth year], 45 y.o.   MRN: [id number]   This very nice 45 y.o. MWF presents for screening for Charcot Lelan Pons Tooth Dz, elevated BP, Hyperlipidemia, Pre-Diabetes and Vitamin D Deficiency. Patient also has hx/o Asthma and uses her inhalers infrequently - usu 1-3 x/month.    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  Patient is followed at [facility] for her Pain control. Today's BP: 112/72 mmHg. Patient has had no complaints of any cardiac type chest pain, palpitations, dyspnea/orthopnea/PND, dizziness, claudication, or dependent edema.   Hyperlipidemia is managed  with diet.  Last Lipids were not at goal with Cholesterol 187; HDL 64; LDL 111; Triglycerides 62 on 01/30/2015.   Also, the patient has history of PreDiabetes and has had no symptoms of reactive hypoglycemia, diabetic polys, paresthesias or visual blurring.  Last A1c was 5.7% on 09/11/2014.   Further, the patient also has history of Vitamin D Deficiency of "53 " in June 2016 and supplements vitamin D without any suspected side-effects. Last vitamin D was 71 on 01/30/2015.   Medication Sig  . albuterol  HFA inhaler Inhale 2 puffs into the lungs every 6 (six) hours as needed for wheezing or shortness of breath.  . CELEBREX 200 MG capsule TAKE 1 CAPSULE BY MOUTH DAILY.  . cetirizine  10 MG tablet Take 10 mg by mouth every evening.  . fluticasone  0.05 % cream Apply 1 application topically 2 (two) times daily.   Marland Kitchen ADVAIR DISKUS 250-50  Inhale 1 puff into the lungs every 12 (twelve) hours.  . gabapentin  800 MG tablet Take 800 mg by mouth 3 (three) times daily.  . Ginger 500 MG CAPS Take 1 capsule by mouth daily.  Lebron Quam 5-325 MG  Take 1 tablet by mouth 2 (two) times daily.  . hyoscyamine/SL 0.125 MG SL tablet Place 1 tablet (0.125 mg total) under the tongue 3 (three) times daily as needed for  cramping.  Marland Kitchen LINZESS 290 MCG CAPS  Take 1 capsule (290 mcg total) by mouth daily.  . mirtazapine  45 MG tablet Take 45 mg by mouth at bedtime.  . montelukast  10 MG tablet Take 10 mg by mouth at bedtime.  . Olopatadine (PATADAY) 0.2 % SOLN Apply to eye.  Marland Kitchen OVER THE COUNTER MEDICATION Fiber Choice 2 tabs daily  . tranexamic acid (LYSTEDA) 650 MG  Take 650 mg by mouth 3 (three) times daily. Patient takes 2 tabs 2-3 times daily for menses pain.  . Vitamin D 50000 UNITS CAPS  1 pill 2 times a week   Allergies  Allergen Reactions  . Oxycontin [Oxycodone Hcl] Nausea And Vomiting  . Penicillins Hives  . Sulfa Antibiotics Hives  . Topamax [Topiramate] Hives   PMHx:   Past Medical History  Diagnosis Date  . Allergy   . Uterine polyp   . Asthma   . Seasonal allergies   . GERD (gastroesophageal reflux disease)   . Neuropathy, peripheral (HCC)     upper and lower extremities seconardy to scharcot-marie  tooth disease  . CMT (Charcot-Marie-Tooth disease)   . Wears glasses   . Chronic constipation    There is no immunization history on file for this patient.  Past Surgical History  Procedure Laterality Date  . Esophageal manometry  05/17/2011    Procedure: ESOPHAGEAL MANOMETRY (EM);  Surgeon: Tory Emerald  Benson Norway, MD;  Location: [facility];  Service: Endoscopy;  Laterality: N/A;  . Tonsillectomy    . Tubal reconstruction  1997  . Tubal ligation  1999  . Dilation and curettage of uterus    . Colonoscopy N/A 11/01/2012    Procedure: COLONOSCOPY;  Surgeon: Rogene Houston, MD;  Location: [facility];  Service: Endoscopy;  Laterality: N/A;  130  . Foot neuroma surgery Right 06-05-2013  . Tonsillectomy  as child  . Hysteroscopy w/d&c N/A 06/27/2013    Procedure: HYSTEROSCOPY POLPYECTOMY  ;  Surgeon: Governor Specking, MD;  Location: [facility];  Service: Gynecology;  Laterality: N/A;  . Laparoscopy N/A 06/27/2013    Procedure: LAPAROSCOPY WITH extensive lysis of adhesions;  Surgeon:  Governor Specking, MD;  Location: [facility];  Service: Gynecology;  Laterality: N/A;   FHx:    Reviewed / unchanged  SHx:    Reviewed / unchanged  Systems Review:  Constitutional: Denies fever, chills, wt changes, headaches, insomnia, fatigue, night sweats, change in appetite. Eyes: Denies redness, blurred vision, diplopia, discharge, itchy, watery eyes.  ENT: Denies discharge, congestion, post nasal drip, epistaxis, sore throat, earache, hearing loss, dental pain, tinnitus, vertigo, sinus pain, snoring.  CV: Denies chest pain, palpitations, irregular heartbeat, syncope, dyspnea, diaphoresis, orthopnea, PND, claudication or edema. Respiratory: denies cough, dyspnea, DOE, pleurisy, hoarseness, laryngitis, wheezing.  Gastrointestinal: Denies dysphagia, odynophagia, heartburn, reflux, water brash, abdominal pain or cramps, nausea, vomiting, bloating, diarrhea, constipation, hematemesis, melena, hematochezia  or hemorrhoids. Genitourinary: Denies dysuria, frequency, urgency, nocturia, hesitancy, discharge, hematuria or flank pain. Musculoskeletal: Denies arthralgias, myalgias, stiffness, jt. swelling, pain, limping or strain/sprain.  Skin: Denies pruritus, rash, hives, warts, acne, eczema or change in skin lesion(s). Neuro: No weakness, tremor, incoordination, spasms, paresthesia or pain. Psychiatric: Denies confusion, memory loss or sensory loss. Endo: Denies change in weight, skin or hair change.  Heme/Lymph: No excessive bleeding, bruising or enlarged lymph nodes.  Physical Exam  BP 112/72 mmHg  Pulse 60  Temp(Src) 97.3 F (36.3 C)  Resp 16  Ht 5\' 5"  (1.651 m)  Wt 157 lb (71.215 kg)  BMI 26.13 kg/m2  Appears well nourished and in no distress. Eyes: PERRLA, EOMs, conjunctiva no swelling or erythema. Sinuses: No frontal/maxillary tenderness ENT/Mouth: EAC's clear, TM's nl w/o erythema, bulging. Nares clear w/o erythema, swelling, exudates. Oropharynx clear without  erythema or exudates. Oral hygiene is good. Tongue normal, non obstructing. Hearing intact.  Neck: Supple. Thyroid nl. Car 2+/2+ without bruits, nodes or JVD. Chest: Respirations nl with BS clear & equal w/o rales, rhonchi, wheezing or stridor.  Cor: Heart sounds normal w/ regular rate and rhythm without sig. murmurs, gallops, clicks, or rubs. Peripheral pulses normal and equal  without edema.  Abdomen: Soft & bowel sounds normal. Non-tender w/o guarding, rebound, hernias, masses, or organomegaly.  Lymphatics: Unremarkable.  Musculoskeletal: Full ROM all peripheral extremities, joint stability, 5/5 strength, and normal gait.  Skin: Warm, dry without exposed rashes, lesions or ecchymosis apparent.  Neuro: Cranial nerves intact, reflexes equal bilaterally. Sensory-motor testing grossly intact. Tendon reflexes grossly intact.  Pysch: Alert & oriented x 3.  Insight and judgement nl & appropriate. No ideations.  Assessment and Plan:  1. Elevated BP, screening  - TSH  2. Mixed hyperlipidemia  - Lipid panel - TSH  3. Prediabetes  - Hemoglobin A1c - Insulin, random  4. Vitamin D deficiency  - VITAMIN D 25 Hydroxy   5. Gastroesophageal reflux disease   6. CMT (Charcot-Marie-Tooth disease)  7. Asthma   8. Medication management  - CBC with Differential/Platelet - BASIC METABOLIC PANEL WITH GFR - Hepatic function panel - Magnesium   Recommended regular exercise, BP monitoring, weight control, and discussed med and SE's. Recommended labs to assess and monitor clinical status. Further disposition pending results of labs. Over 30 minutes of exam, counseling, chart review was performed

## 2015-05-07 NOTE — Patient Instructions (Signed)

## 2015-05-08 LAB — VITAMIN D 25 HYDROXY (VIT D DEFICIENCY, FRACTURES): Vit D, 25-Hydroxy: 72 ng/mL (ref 30–100)

## 2015-05-08 LAB — HEMOGLOBIN A1C
Hgb A1c MFr Bld: 5.6 % (ref <5.7)
Mean Plasma Glucose: 114 mg/dL (ref <117)

## 2015-05-08 LAB — INSULIN, RANDOM: Insulin: 5.1 u[IU]/mL (ref 2.0–19.6)

## 2015-06-05 ENCOUNTER — Ambulatory Visit (INDEPENDENT_AMBULATORY_CARE_PROVIDER_SITE_OTHER): Payer: 59 | Admitting: Physician Assistant

## 2015-06-05 ENCOUNTER — Encounter: Payer: Self-pay | Admitting: Physician Assistant

## 2015-06-05 VITALS — BP 108/70 | HR 67 | Temp 97.7°F | Resp 16 | Ht 65.0 in | Wt 155.8 lb

## 2015-06-05 DIAGNOSIS — J0100 Acute maxillary sinusitis, unspecified: Secondary | ICD-10-CM

## 2015-06-05 MED ORDER — PREDNISONE 20 MG PO TABS: ORAL_TABLET | ORAL | Status: DC

## 2015-06-05 MED ORDER — AZITHROMYCIN 250 MG PO TABS: ORAL_TABLET | ORAL | Status: AC

## 2015-06-05 MED ORDER — METHOCARBAMOL 500 MG PO TABS: 500.0000 mg | ORAL_TABLET | Freq: Three times a day (TID) | ORAL | Status: DC | PRN

## 2015-06-05 NOTE — Progress Notes (Signed)
Subjective:    Patient ID: Brandi Erickson, female    DOB: 1970/08/24, 45 y.o.   MRN: [id number]  HPI 45 y.o. AAF with sinus infection x 1 week. She has been on zyrtec. Has had chills but no fever. She has sinus congestion, pressure with green mucus, sore throat, cough, no SOB, wheezing, CP.    Blood pressure 108/70, pulse 67, temperature 97.7 F (36.5 C), temperature source Temporal, resp. rate 16, height 5\' 5"  (1.651 m), weight 155 lb 12.8 oz (70.67 kg), last menstrual period 06/02/2015, SpO2 96 %.  Past Medical History  Diagnosis Date  . Allergy   . Uterine polyp   . Asthma   . Seasonal allergies   . GERD (gastroesophageal reflux disease)   . Neuropathy, peripheral (HCC)     upper and lower extremities seconardy to scharcot-marie  tooth disease  . CMT (Charcot-Marie-Tooth disease)   . Wears glasses   . Chronic constipation    Current Outpatient Prescriptions on File Prior to Visit  Medication Sig Dispense Refill  . albuterol (PROAIR HFA) 108 (90 BASE) MCG/ACT inhaler Inhale 2 puffs into the lungs every 6 (six) hours as needed for wheezing or shortness of breath.    . celecoxib (CELEBREX) 200 MG capsule TAKE 1 CAPSULE BY MOUTH DAILY. 30 capsule 2  . cetirizine (ZYRTEC) 10 MG tablet Take 10 mg by mouth every evening.    . fluticasone (CUTIVATE) 0.05 % cream Apply 1 application topically 2 (two) times daily.     . Fluticasone-Salmeterol (ADVAIR DISKUS) 250-50 MCG/DOSE AEPB Inhale 1 puff into the lungs every 12 (twelve) hours.    . gabapentin (NEURONTIN) 800 MG tablet Take 800 mg by mouth 3 (three) times daily.    . Ginger 500 MG CAPS Take 1 capsule by mouth daily.    . hyoscyamine (LEVSIN/SL) 0.125 MG SL tablet Place 1 tablet (0.125 mg total) under the tongue 3 (three) times daily as needed for cramping. 90 tablet 2  . Linaclotide (LINZESS) 290 MCG CAPS capsule Take 1 capsule (290 mcg total) by mouth daily. 90 capsule 3  . mirtazapine (REMERON) 45 MG tablet Take 45 mg by mouth at  bedtime.    . montelukast (SINGULAIR) 10 MG tablet Take 10 mg by mouth at bedtime.    . Olopatadine HCl (PATADAY) 0.2 % SOLN Apply to eye.    Marland Kitchen OVER THE COUNTER MEDICATION Fiber Choice 2 tabs daily    . tranexamic acid (LYSTEDA) 650 MG TABS tablet Take 650 mg by mouth 3 (three) times daily. Patient takes 2 tabs 2-3 times daily for menses pain.    . Vitamin D, Ergocalciferol, (DRISDOL) 50000 UNITS CAPS capsule 1 pill 2 times a week 30 capsule 2   No current facility-administered medications on file prior to visit.    Review of Systems  Constitutional: Negative for chills and diaphoresis.  HENT: Positive for congestion, postnasal drip, sinus pressure and sneezing. Negative for ear pain and sore throat.   Respiratory: Positive for cough and wheezing. Negative for apnea, choking, chest tightness, shortness of breath and stridor.   Cardiovascular: Negative.   Gastrointestinal: Negative.   Genitourinary: Negative.   Musculoskeletal: Negative for neck pain.  Neurological: Positive for headaches.       Objective:   Physical Exam  Constitutional: She appears well-developed and well-nourished.  HENT:  Head: Normocephalic and atraumatic.  Right Ear: External ear normal.  Nose: Right sinus exhibits maxillary sinus tenderness. Right sinus exhibits no frontal sinus tenderness. Left sinus  exhibits maxillary sinus tenderness. Left sinus exhibits no frontal sinus tenderness.  Eyes: Conjunctivae and EOM are normal.  Neck: Normal range of motion. Neck supple.  Cardiovascular: Normal rate, regular rhythm, normal heart sounds and intact distal pulses.   Pulmonary/Chest: Effort normal. No respiratory distress. She has wheezes. She has no rales. She exhibits no tenderness.  Abdominal: Soft. Bowel sounds are normal.  Lymphadenopathy:    She has no cervical adenopathy.  Skin: Skin is warm and dry.      Assessment & Plan:  1. Acute maxillary sinusitis, recurrence not specified - methocarbamol  (ROBAXIN) 500 MG tablet; Take 1 tablet (500 mg total) by mouth 3 (three) times daily as needed for muscle spasms.  Dispense: 60 tablet; Refill: 3 - predniSONE (DELTASONE) 20 MG tablet; 2 tablets daily for 3 days, 1 tablet daily for 4 days.  Dispense: 10 tablet; Refill: 0 - azithromycin (ZITHROMAX) 250 MG tablet; Take 2 tablets (500 mg) on  Day 1,  followed by 1 tablet (250 mg) once daily on Days 2 through 5.  Dispense: 6 each; Refill: 1

## 2015-06-05 NOTE — Patient Instructions (Signed)
Please take the prednisone to help decrease inflammation and therefore decrease symptoms. Take it it with food to avoid GI upset. It can cause increased energy but on the other hand it can make it hard to sleep at night so please take it AT NIGHT WITH DINNER, it takes 8-12 hours to start working so it will NOT affect your sleeping if you take it at night with your food!!  If you are diabetic it will increase your sugars so decrease carbs and monitor your sugars closely.    HOW TO TREAT VIRAL COUGH AND COLD SYMPTOMS:  -Symptoms usually last at least 1 week with the worst symptoms being around day 4.  - colds usually start with a sore throat and end with a cough, and the cough can take 2 weeks to get better.  -No antibiotics are needed for colds, flu, sore throats, cough, bronchitis UNLESS symptoms are longer than 7 days OR if you are getting better then get drastically worse.  -There are a lot of combination medications (Dayquil, Nyquil, Vicks 44, tyelnol cold and sinus, ETC). Please look at the ingredients on the back so that you are treating the correct symptoms and not doubling up on medications/ingredients.    Medicines you can use  Nasal congestion  - pseudoephedrine (Sudafed)- behind the counter, do not use if you have high blood pressure, medicine that have -D in them.  - phenylephrine (Sudafed PE) -Dextormethorphan + chlorpheniramine (Coridcidin HBP)- okay if you have high blood pressure -Oxymetazoline (Afrin) nasal spray- LIMIT to 3 days -Saline nasal spray -Neti pot (used distilled or bottled water)  Ear pain/congestion  -pseudoephedrine (sudafed) - Nasonex/flonase nasal spray  Fever  -Acetaminophen (Tyelnol) -Ibuprofen (Advil, motrin, aleve)  Sore Throat  -Acetaminophen (Tyelnol) -Ibuprofen (Advil, motrin, aleve) -Drink a lot of water -Gargle with salt water - Rest your voice (don't talk) -Throat sprays -Cough drops  Body Aches  -Acetaminophen (Tyelnol) -Ibuprofen  (Advil, motrin, aleve)  Headache  -Acetaminophen (Tyelnol) -Ibuprofen (Advil, motrin, aleve) - Exedrin, Exedrin Migraine  Allergy symptoms (cough, sneeze, runny nose, itchy eyes) -Claritin or loratadine cheapest but likely the weakest  -Zyrtec or certizine at night because it can make you sleepy -The strongest is allegra or fexafinadine  Cheapest at [employer], [employer], [employer]  Cough  -Dextromethorphan (Delsym)- medicine that has DM in it -Guafenesin (Mucinex/Robitussin) - cough drops - drink lots of water  Chest Congestion  -Guafenesin (Mucinex/Robitussin)  Red Itchy Eyes  - Naphcon-A  Upset Stomach  - Bland diet (nothing spicy, greasy, fried, and high acid foods like tomatoes, oranges, berries) -OKAY- cereal, bread, soup, crackers, rice -Eat smaller more frequent meals -reduce caffeine, no alcohol -Loperamide (Imodium-AD) if diarrhea -Prevacid for heart burn  General health when sick  -Hydration -wash your hands frequently -keep surfaces clean -change pillow cases and sheets often -Get fresh air but do not exercise strenuously -Vitamin D, double up on it - Vitamin C -Zinc       

## 2015-06-10 ENCOUNTER — Other Ambulatory Visit: Payer: Self-pay | Admitting: *Deleted

## 2015-06-10 DIAGNOSIS — J0100 Acute maxillary sinusitis, unspecified: Secondary | ICD-10-CM

## 2015-06-10 MED ORDER — METHOCARBAMOL 500 MG PO TABS: 500.0000 mg | ORAL_TABLET | Freq: Three times a day (TID) | ORAL | Status: DC | PRN

## 2015-06-10 MED ORDER — CELECOXIB 200 MG PO CAPS: 200.0000 mg | ORAL_CAPSULE | Freq: Every day | ORAL | Status: DC

## 2015-06-10 MED ORDER — VITAMIN D (ERGOCALCIFEROL) 1.25 MG (50000 UNIT) PO CAPS: ORAL_CAPSULE | ORAL | Status: DC

## 2015-06-30 DIAGNOSIS — Z124 Encounter for screening for malignant neoplasm of cervix: Secondary | ICD-10-CM | POA: Diagnosis not present

## 2015-06-30 DIAGNOSIS — Z01419 Encounter for gynecological examination (general) (routine) without abnormal findings: Secondary | ICD-10-CM | POA: Diagnosis not present

## 2015-06-30 DIAGNOSIS — Z1279 Encounter for screening for malignant neoplasm of other genitourinary organs: Secondary | ICD-10-CM | POA: Diagnosis not present

## 2015-06-30 DIAGNOSIS — Z1231 Encounter for screening mammogram for malignant neoplasm of breast: Secondary | ICD-10-CM | POA: Diagnosis not present

## 2015-07-04 DIAGNOSIS — R279 Unspecified lack of coordination: Secondary | ICD-10-CM | POA: Diagnosis not present

## 2015-07-04 DIAGNOSIS — M79671 Pain in right foot: Secondary | ICD-10-CM | POA: Diagnosis not present

## 2015-07-04 DIAGNOSIS — M79672 Pain in left foot: Secondary | ICD-10-CM | POA: Diagnosis not present

## 2015-07-04 DIAGNOSIS — G600 Hereditary motor and sensory neuropathy: Secondary | ICD-10-CM | POA: Diagnosis not present

## 2015-07-04 DIAGNOSIS — R262 Difficulty in walking, not elsewhere classified: Secondary | ICD-10-CM | POA: Diagnosis not present

## 2015-07-04 DIAGNOSIS — R5381 Other malaise: Secondary | ICD-10-CM | POA: Diagnosis not present

## 2015-07-10 DIAGNOSIS — R6889 Other general symptoms and signs: Secondary | ICD-10-CM | POA: Diagnosis not present

## 2015-07-10 DIAGNOSIS — J029 Acute pharyngitis, unspecified: Secondary | ICD-10-CM | POA: Diagnosis not present

## 2015-07-23 ENCOUNTER — Ambulatory Visit (INDEPENDENT_AMBULATORY_CARE_PROVIDER_SITE_OTHER): Payer: 59 | Admitting: Internal Medicine

## 2015-07-23 ENCOUNTER — Encounter: Payer: Self-pay | Admitting: Internal Medicine

## 2015-07-23 VITALS — BP 120/78 | HR 62 | Temp 98.4°F | Resp 16 | Ht 65.0 in | Wt 155.0 lb

## 2015-07-23 DIAGNOSIS — N924 Excessive bleeding in the premenopausal period: Secondary | ICD-10-CM | POA: Diagnosis not present

## 2015-07-23 DIAGNOSIS — L659 Nonscarring hair loss, unspecified: Secondary | ICD-10-CM | POA: Diagnosis not present

## 2015-07-23 NOTE — Patient Instructions (Signed)
Please talk to your obgyn about the recent polyp.  Sometimes we can do a uterine ablation which can help with some of the heavy bleeding.

## 2015-07-23 NOTE — Progress Notes (Signed)
Subjective:    Patient ID: Brandi Erickson, female    DOB: 1970-08-30, 45 y.o.   MRN: [id number]  HPI   Patient reports that she has been having some issues with her weight coming up and also with hair loss.  She reports that she has been having increased hair loss when she brushes her hair.  She reports that it comes out specifically when she washes it.  She also reports that she feels like she is gaining weight.  She has been having a lot of heavy clotting in her periods recently.  She recently had a pap smear and a mammogram done.   Pap smear was normal.  She was told that she had a polyp.  She reports that periods have been a lot more irregular.  She also notes that she is having pain.  Last period was last month. She can't recall having a cycle this month so far.  She has had issues with period for over a year now with heavy menstrual cycles.  She reports that she has been changing her diet and is eating a lot more fruits and veggies.  She reports that she is eating more lean meats now too.  She is exercising and getting on the bicycle and the treadmill.  She reports that she is eating a lot more sun flower seeds and pumpkin seeds.  She has not had any upper respiratory infections recently.  She is not aware of her family history for thyroid issues.      Review of Systems  Constitutional: Negative for fever, chills and fatigue.  HENT: Negative for congestion, ear pain, nosebleeds, postnasal drip, rhinorrhea, sore throat and trouble swallowing.   Eyes: Negative.   Respiratory: Negative for cough, chest tightness, shortness of breath and stridor.   Cardiovascular: Negative for chest pain and palpitations.  Gastrointestinal: Negative for nausea, vomiting, diarrhea, constipation, blood in stool, abdominal distention and rectal pain.  Genitourinary: Positive for vaginal bleeding. Negative for dysuria, urgency, hematuria, vaginal discharge, difficulty urinating and vaginal pain.  Skin: Negative for  rash.  Allergic/Immunologic: Positive for environmental allergies.  Neurological: Negative for dizziness, weakness and light-headedness.       Objective:   Physical Exam  Constitutional: She is oriented to person, place, and time. She appears well-developed and well-nourished. No distress.  HENT:  Head: Normocephalic.  Mouth/Throat: Oropharynx is clear and moist. No oropharyngeal exudate.  No visible hair patches or hair loss  Eyes: Conjunctivae are normal. No scleral icterus.  Neck: Normal range of motion. Neck supple. No JVD present. No thyromegaly present.  Cardiovascular: Normal rate, regular rhythm, normal heart sounds and intact distal pulses.  Exam reveals no gallop and no friction rub.   No murmur heard. Pulmonary/Chest: Effort normal and breath sounds normal. No respiratory distress. She has no wheezes. She has no rales. She exhibits no tenderness.  Abdominal: Soft. Bowel sounds are normal. She exhibits no distension and no mass. There is no tenderness. There is no rebound and no guarding.  Musculoskeletal: Normal range of motion.  Lymphadenopathy:    She has no cervical adenopathy.  Neurological: She is alert and oriented to person, place, and time.  Skin: Skin is warm and dry. She is not diaphoretic.  Psychiatric: She has a normal mood and affect. Her behavior is normal. Judgment and thought content normal.  Nursing note and vitals reviewed.   Filed Vitals:   07/23/15 1052  BP: 120/78  Pulse: 62  Temp: 98.4 F (36.9 C)  Resp: 16   Wt Readings from Last 3 Encounters:  07/23/15 155 lb (70.308 kg)  06/05/15 155 lb 12.8 oz (70.67 kg)  05/07/15 157 lb (71.215 kg)            Assessment & Plan:    1. Hair loss -no meds on med review that stand out for hair loss -could be early menopause, but no visible evidence  - hCG, quantitative, pregnancy - FSH - LH - Prolactin - Estrogens, total - Testosterone, Total - Zinc - Iron and TIBC - TSH  2. Excessive  bleeding in premenopausal period -may need to go back to Obgyn for ablation vs. Hysterectomy - US Pelvis Complete; Future

## 2015-07-24 ENCOUNTER — Other Ambulatory Visit: Payer: Self-pay | Admitting: Internal Medicine

## 2015-07-24 DIAGNOSIS — N924 Excessive bleeding in the premenopausal period: Secondary | ICD-10-CM

## 2015-07-24 LAB — TESTOSTERONE: Testosterone: 35 ng/dL

## 2015-07-24 LAB — IRON AND TIBC
%SAT: 16 % (ref 11–50)
Iron: 75 ug/dL (ref 40–190)
TIBC: 459 ug/dL — ABNORMAL HIGH (ref 250–450)
UIBC: 384 ug/dL (ref 125–400)

## 2015-07-24 LAB — TSH: TSH: 1.55 mIU/L

## 2015-07-24 LAB — LUTEINIZING HORMONE: LH: 0.7 m[IU]/mL

## 2015-07-24 LAB — PROLACTIN: Prolactin: 9.5 ng/mL

## 2015-07-24 LAB — FOLLICLE STIMULATING HORMONE: FSH: 2 m[IU]/mL

## 2015-07-24 LAB — HCG, QUANTITATIVE, PREGNANCY: hCG, Beta Chain, Quant, S: <2 m[IU]/mL

## 2015-07-26 LAB — ESTROGENS, TOTAL: Estrogen: 262.2 pg/mL

## 2015-07-28 ENCOUNTER — Encounter (INDEPENDENT_AMBULATORY_CARE_PROVIDER_SITE_OTHER): Payer: Self-pay | Admitting: Internal Medicine

## 2015-07-28 ENCOUNTER — Ambulatory Visit (INDEPENDENT_AMBULATORY_CARE_PROVIDER_SITE_OTHER): Payer: 59 | Admitting: Internal Medicine

## 2015-07-28 ENCOUNTER — Encounter: Payer: Self-pay | Admitting: Gastroenterology

## 2015-07-28 VITALS — BP 124/70 | HR 72 | Temp 98.1°F | Ht 66.0 in | Wt 154.6 lb

## 2015-07-28 DIAGNOSIS — K219 Gastro-esophageal reflux disease without esophagitis: Secondary | ICD-10-CM

## 2015-07-28 DIAGNOSIS — K5909 Other constipation: Secondary | ICD-10-CM | POA: Diagnosis not present

## 2015-07-28 DIAGNOSIS — K648 Other hemorrhoids: Secondary | ICD-10-CM

## 2015-07-28 MED ORDER — OMEPRAZOLE 40 MG PO CPDR: 40.0000 mg | DELAYED_RELEASE_CAPSULE | Freq: Every day | ORAL | Status: DC

## 2015-07-28 NOTE — Patient Instructions (Addendum)
Continue the LInzess. OV in 1 year.  Rx Omeprazole 40mg  daily.

## 2015-07-28 NOTE — Progress Notes (Signed)
Subjective:    Patient ID: Brandi Erickson, female    DOB: [birth year], 45 y.o.   MRN: [id number] Wt 153 in April of 2016 HPI PCP: Dr. Anitra Lauth.    HPI Here today for f/u. She was last seen in Aprl of 2016 for constipation. She has chronic pain and takes narcotics for pain.   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  She states she is doing good. She has a hx of chronic constipation and takes Linzess 290 every am. She says she is having some problems with constipation. She has BM once a day. She says her hemorrhoids hurt sometimes when she has a BM.  She tells me she has bloating and fullness. She bloats before her periods and after her periods. She has heavy vaginal bleeding.  Her appetite is good. No weight loss. No abdominal pain. She says she has "heartburn" at times.      11/01/2012 Colonocopy: Impression:  Normal terminal ileum.  Small external hemorrhoids otherwise normal colonoscopy.  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   Review of Systems Past Medical History  Diagnosis Date  . Allergy   . Uterine polyp   . Asthma   . Seasonal allergies   . GERD (gastroesophageal reflux disease)   . Neuropathy, peripheral (HCC)     upper and lower extremities seconardy to scharcot-marie  tooth disease  . CMT (Charcot-Marie-Tooth disease)   . Wears glasses   . Chronic constipation     Past Surgical History  Procedure Laterality Date  . Esophageal manometry  05/17/2011    Procedure: ESOPHAGEAL MANOMETRY (EM);  Surgeon: Beryle Beams, MD;  Location: [facility];  Service: Endoscopy;  Laterality: N/A;  . Tonsillectomy    . Tubal reconstruction  1997  . Tubal ligation  1999  . Dilation and curettage of uterus    . Colonoscopy N/A 11/01/2012    Procedure: COLONOSCOPY;  Surgeon: Rogene Houston, MD;  Location: [facility];  Service: Endoscopy;  Laterality: N/A;  130  . Foot neuroma surgery Right 06-05-2013  .  Tonsillectomy  as child  . Hysteroscopy w/d&c N/A 06/27/2013    Procedure: HYSTEROSCOPY POLPYECTOMY  ;  Surgeon: Governor Specking, MD;  Location: [facility];  Service: Gynecology;  Laterality: N/A;  . Laparoscopy N/A 06/27/2013    Procedure: LAPAROSCOPY WITH extensive lysis of adhesions;  Surgeon: Governor Specking, MD;  Location: [facility];  Service: Gynecology;  Laterality: N/A;    Allergies  Allergen Reactions  . Oxycontin [Oxycodone Hcl] Nausea And Vomiting  . Penicillins Hives  . Sulfa Antibiotics Hives  . Topamax [Topiramate] Hives    Current Outpatient Prescriptions on File Prior to Visit  Medication Sig Dispense Refill  . albuterol (PROAIR HFA) 108 (90 BASE) MCG/ACT inhaler Inhale 2 puffs into the lungs every 6 (six) hours as needed for wheezing or shortness of breath.    . cetirizine (ZYRTEC) 10 MG tablet Take 10 mg by mouth every evening.    . fluticasone (CUTIVATE) 0.05 % cream Apply 1 application topically 2 (two) times daily.     . Fluticasone-Salmeterol (ADVAIR DISKUS) 250-50 MCG/DOSE AEPB Inhale 1 puff into the lungs every 12 (twelve) hours.    . gabapentin (NEURONTIN) 800 MG tablet Take 800 mg by mouth 3 (three) times daily.    . Gabapentin Enacarbil 600 MG TBCR Take by mouth.    . Ginger 500 MG CAPS  Take 1 capsule by mouth daily.    Marland Kitchen HYDROcodone-acetaminophen (NORCO) 7.5-325 MG tablet TAKE 1 TABLET BY MOUTH 4 TIMES A DAY AS NEEDED  0  . Linaclotide (LINZESS) 290 MCG CAPS capsule Take 1 capsule (290 mcg total) by mouth daily. 90 capsule 3  . methocarbamol (ROBAXIN) 500 MG tablet Take 1 tablet (500 mg total) by mouth 3 (three) times daily as needed for muscle spasms. 270 tablet 1  . mirtazapine (REMERON) 45 MG tablet Take 45 mg by mouth at bedtime.    . montelukast (SINGULAIR) 10 MG tablet Take 10 mg by mouth at bedtime.    . Olopatadine HCl (PATADAY) 0.2 % SOLN Apply to eye.    Marland Kitchen OVER THE COUNTER MEDICATION Fiber Choice 2 tabs daily    .  tranexamic acid (LYSTEDA) 650 MG TABS tablet Take 650 mg by mouth 3 (three) times daily. Patient takes 2 tabs 2-3 times daily for menses pain.    . Vitamin D, Ergocalciferol, (DRISDOL) 50000 units CAPS capsule 1 pill daily or as directed. (Patient taking differently: 50,000 Units. Twice a week) 90 capsule 0   No current facility-administered medications on file prior to visit.        Objective:   Physical Exam Blood pressure 124/70, pulse 72, temperature 98.1 F (36.7 C), height 5\' 6"  (1.676 m), weight 154 lb 9.6 oz (70.126 kg). Alert and oriented. Skin warm and dry. Oral mucosa is moist.   . Sclera anicteric, conjunctivae is pink. Thyroid not enlarged. No cervical lymphadenopathy. Lungs clear. Heart regular rate and rhythm.  Abdomen is soft. Bowel sounds are positive. No hepatomegaly. No abdominal masses felt. No tenderness.  No edema to lower extremities.          Assessment & Plan:  Chronic constipation. Presently taking LInzess. She has increased fiber in her diet.  GERD: Will try her on Omeprazole 40mg  daily.  She will call in 4 weeks and let me know how she is doing. Hemorrhoid: Will refer to Dr. Oneida Alar at patient's request.

## 2015-07-29 ENCOUNTER — Ambulatory Visit (HOSPITAL_COMMUNITY)
Admission: RE | Admit: 2015-07-29 | Discharge: 2015-07-29 | Disposition: A | Discharge status: Home / Self Care | Payer: 59 | Source: Ambulatory Visit | Attending: Internal Medicine | Admitting: Internal Medicine

## 2015-07-29 DIAGNOSIS — R938 Abnormal findings on diagnostic imaging of other specified body structures: Secondary | ICD-10-CM | POA: Insufficient documentation

## 2015-07-29 DIAGNOSIS — D259 Leiomyoma of uterus, unspecified: Secondary | ICD-10-CM | POA: Diagnosis not present

## 2015-07-29 DIAGNOSIS — N852 Hypertrophy of uterus: Secondary | ICD-10-CM | POA: Diagnosis not present

## 2015-07-29 DIAGNOSIS — N924 Excessive bleeding in the premenopausal period: Secondary | ICD-10-CM | POA: Diagnosis present

## 2015-07-29 LAB — ZINC: Zinc: 70 ug/dL (ref 60–130)

## 2015-08-07 ENCOUNTER — Ambulatory Visit: Payer: 59 | Admitting: Gastroenterology

## 2015-08-11 ENCOUNTER — Telehealth: Payer: Self-pay | Admitting: Internal Medicine

## 2015-08-11 NOTE — Telephone Encounter (Signed)
returned call, patient was referred to a [facility] provider for hemmrd surgery, by her gastro, she prefers to go to a Ge[employer] information systems officer. I advised her to make this request to her gastro, since he is the referring provider.

## 2015-08-18 ENCOUNTER — Telehealth (INDEPENDENT_AMBULATORY_CARE_PROVIDER_SITE_OTHER): Payer: Self-pay | Admitting: Internal Medicine

## 2015-08-18 NOTE — Telephone Encounter (Signed)
Forwarded to Terri 

## 2015-08-18 NOTE — Telephone Encounter (Signed)
Sent to Terri to address with the patient upon her return 08/19/2015.

## 2015-08-18 NOTE — Telephone Encounter (Signed)
Patient called, left a message, having side affects from [place].  She'd like something else called in.  [phone]

## 2015-08-19 NOTE — Telephone Encounter (Signed)
I advised her to stop the Linzess. Take a stool softener or a laxative as needed

## 2015-08-25 NOTE — Patient Instructions (Signed)
Recommend Adult Low Dose Aspirin or   coated  Aspirin 81 mg daily   To reduce risk of Colon Cancer 20 %,   Skin Cancer 26 % ,   Melanoma 46%   and   Pancreatic cancer 60%   ++++++++++++++++++++++++++++++++++++++++++++++++++++++ Vitamin D goal   is between 70-100.   Please make sure that you are taking your Vitamin D as directed.   It is very important as a natural anti-inflammatory   helping hair, skin, and nails, as well as reducing stroke and heart attack risk.   It helps your bones and helps with mood.  It also decreases numerous cancer risks so please take it as directed.   Low Vit D is associated with a 200-300% higher risk for CANCER   and 200-300% higher risk for HEART   ATTACK  &  STROKE.   .....................................Marland Kitchen  It is also associated with higher death rate at younger ages,   autoimmune diseases like Rheumatoid arthritis, Lupus, Multiple Sclerosis.     Also many other serious conditions, like depression, Alzheimer's  Dementia, infertility, muscle aches, fatigue, fibromyalgia - just to name a few.  ++++++++++++++++++++++++++++++++++++++++++++++++  Recommend the book "The END of DIETING" by Dr Excell Seltzer   & the book "The END of DIABETES " by Dr Excell Seltzer  At [email or web address] - get book & Audio CD's     Being diabetic has a  300% increased risk for heart attack, stroke, cancer, and alzheimer- type vascular dementia. It is very important that you work harder with diet by avoiding all foods that are white. Avoid white rice (brown & wild rice is OK), white potatoes (sweetpotatoes in moderation is OK), White bread or wheat bread or anything made out of white flour like bagels, donuts, rolls, buns, biscuits, cakes, pastries, cookies, pizza crust, and pasta (made from white flour & egg whites) - vegetarian pasta or spinach or wheat pasta is OK. Multigrain breads like Arnold's or Pepper[employer], or multigrain sandwich thins or flatbreads.  Diet,  exercise and weight loss can reverse and cure diabetes in the early stages.  Diet, exercise and weight loss is very important in the control and prevention of complications of diabetes which affects every system in your body, ie. Brain - dementia/stroke, eyes - glaucoma/blindness, heart - heart attack/heart failure, kidneys - dialysis, stomach - gastric paralysis, intestines - malabsorption, nerves - severe painful neuritis, circulation - gangrene & loss of a leg(s), and finally cancer and Alzheimers.    I recommend avoid fried & greasy foods,  sweets/candy, white rice (brown or wild rice or Quinoa is OK), white potatoes (sweet potatoes are OK) - anything made from white flour - bagels, doughnuts, rolls, buns, biscuits,white and wheat breads, pizza crust and traditional pasta made of white flour & egg white(vegetarian pasta or spinach or wheat pasta is OK).  Multi-grain bread is OK - like multi-grain flat bread or sandwich thins. Avoid alcohol in excess. Exercise is also important.    Eat all the vegetables you want - avoid meat, especially red meat and dairy - especially cheese.  Cheese is the most concentrated form of trans-fats which is the worst thing to clog up our arteries. Veggie cheese is OK which can be found in the fresh produce section at [employer] or [employer] or [employer]  ++++++++++++++++++++++++++++++++++++++++++++++++++ DASH Eating Plan  DASH stands for "Dietary Approaches to Stop Hypertension."   The DASH eating plan is a healthy eating plan that has been shown to reduce high blood  pressure (hypertension). Additional health benefits may include reducing the risk of type 2 diabetes mellitus, heart disease, and stroke. The DASH eating plan may also help with weight loss.  WHAT DO I NEED TO KNOW ABOUT THE DASH EATING PLAN?  For the DASH eating plan, you will follow these general guidelines:  Choose foods with a percent daily value for sodium of less than 5% (as listed on the food  label).  Use salt-free seasonings or herbs instead of table salt or sea salt.  Check with your health care provider or pharmacist before using salt substitutes.  Eat lower-sodium products, often labeled as "lower sodium" or "no salt added."  Eat fresh foods.  Eat more vegetables, fruits, and low-fat dairy products.    Choose whole grains. Look for the word "whole" as the first word in the ingredient list.  Choose fish   Limit sweets, desserts, sugars, and sugary drinks.  Choose heart-healthy fats.  Eat veggie cheese   Eat more home-cooked food and less restaurant, buffet, and fast food.  Limit fried foods.  [place]uffaker foods using methods other than frying.  Limit canned vegetables. If you do use them, rinse them well to decrease the sodium.  When eating at a restaurant, ask that your food be prepared with less salt, or no salt if possible.                      WHAT FOODS CAN I EAT?  Read Dr Fara Olden Fuhrman's books on The End of Dieting & The End of Diabetes  Grains  Whole grain or whole wheat bread. Brown rice. Whole grain or whole wheat pasta. Quinoa, bulgur, and whole grain cereals. Low-sodium cereals. Corn or whole wheat flour tortillas. Whole grain cornbread. Whole grain crackers. Low-sodium crackers.  Vegetables  Fresh or frozen vegetables (raw, steamed, roasted, or grilled). Low-sodium or reduced-sodium tomato and vegetable juices. Low-sodium or reduced-sodium tomato sauce and paste. Low-sodium or reduced-sodium canned vegetables.   Fruits  All fresh, canned (in natural juice), or frozen fruits.  Protein Products   All fish and seafood.  Dried beans, peas, or lentils. Unsalted nuts and seeds. Unsalted canned beans.  Dairy  Low-fat dairy products, such as skim or 1% milk, 2% or reduced-fat cheeses, low-fat ricotta or cottage cheese, or plain low-fat yogurt. Low-sodium or reduced-sodium cheeses.  Fats and Oils  Tub margarines without trans fats. Light or  reduced-fat mayonnaise and salad dressings (reduced sodium). Avocado. Safflower, olive, or canola oils. Natural peanut or almond butter.  Other  Unsalted popcorn and pretzels. The items listed above may not be a complete list of recommended foods or beverages. Contact your dietitian for more options.  +++++++++++++++++++++++++++++++++++++++++++  WHAT FOODS ARE NOT RECOMMENDED?  Grains/ White flour or wheat flour  White bread. White pasta. White rice. Refined cornbread. Bagels and croissants. Crackers that contain trans fat.  Vegetables  Creamed or fried vegetables. Vegetables in a . Regular canned vegetables. Regular canned tomato sauce and paste. Regular tomato and vegetable juices.  Fruits  Dried fruits. Canned fruit in light or heavy syrup. Fruit juice.  Meat and Other Protein Products  Meat in general - RED mwaet & White meat.  Fatty cuts of meat. Ribs, chicken wings, bacon, sausage, bologna, salami, chitterlings, fatback, hot dogs, bratwurst, and packaged luncheon meats.  Dairy  Whole or 2% milk, cream, half-and-half, and cream cheese. Whole-fat or sweetened yogurt. Full-fat cheeses or blue cheese. Nondairy creamers and whipped toppings. Processed cheese, cheese spreads, or  cheese curds.  Condiments  Onion and garlic salt, seasoned salt, table salt, and sea salt. Canned and packaged gravies. Worcestershire sauce. Tartar sauce. Barbecue sauce. Teriyaki sauce. Soy sauce, including reduced sodium. Steak sauce. Fish sauce. Oyster sauce. Cocktail sauce. Horseradish. Ketchup and mustard. Meat flavorings and tenderizers. Bouillon cubes. Hot sauce. Tabasco sauce. Marinades. Taco seasonings. Relishes.  Fats and Oils Butter, stick margarine, lard, shortening and bacon fat. Coconut, palm kernel, or palm oils. Regular salad dressings.  Pickles and olives. Salted popcorn and pretzels.  The items listed above may not be a complete list of foods and beverages to avoid.   Preventive  Care for Adults  A healthy lifestyle and preventive care can promote health and wellness. Preventive health guidelines for women include the following key practices.  A routine yearly physical is a good way to check with your health care provider about your health and preventive screening. It is a chance to share any concerns and updates on your health and to receive a thorough exam.  Visit your dentist for a routine exam and preventive care every 6 months. Brush your teeth twice a day and floss once a day. Good oral hygiene prevents tooth decay and gum disease.  The frequency of eye exams is based on your age, health, family medical history, use of contact lenses, and other factors. Follow your health care provider's recommendations for frequency of eye exams.  Eat a healthy diet. Foods like vegetables, fruits, whole grains, low-fat dairy products, and lean protein foods contain the nutrients you need without too many calories. Decrease your intake of foods high in solid fats, added sugars, and salt. Eat the right amount of calories for you.Get information about a proper diet from your health care provider, if necessary.  Regular physical exercise is one of the most important things you can do for your health. Most adults should get at least 150 minutes of moderate-intensity exercise (any activity that increases your heart rate and causes you to sweat) each week. In addition, most adults need muscle-strengthening exercises on 2 or more days a week.  Maintain a healthy weight. The body mass index (BMI) is a screening tool to identify possible weight problems. It provides an estimate of body fat based on height and weight. Your health care provider can find your BMI and can help you achieve or maintain a healthy weight.For adults 20 years and older:  A BMI below 18.5 is considered underweight.  A BMI of 18.5 to 24.9 is normal.  A BMI of 25 to 29.9 is considered overweight.  A BMI of 30 and  above is considered obese.  Maintain normal blood lipids and cholesterol levels by exercising and minimizing your intake of saturated fat. Eat a balanced diet with plenty of fruit and vegetables. Blood tests for lipids and cholesterol should begin at age 20 and be repeated every 5 years. If your lipid or cholesterol levels are high, you are over 50, or you are at high risk for heart disease, you may need your cholesterol levels checked more frequently.Ongoing high lipid and cholesterol levels should be treated with medicines if diet and exercise are not working.  If you smoke, find out from your health care provider how to quit. If you do not use tobacco, do not start.  Lung cancer screening is recommended for adults aged 55-80 years who are at high risk for developing lung cancer because of a history of smoking. A yearly low-dose CT scan of the lungs is recommended   for people who have at least a 30-pack-year history of smoking and are a current smoker or have quit within the past 15 years. A pack year of smoking is smoking an average of 1 pack of cigarettes a day for 1 year (for example: 1 pack a day for 30 years or 2 packs a day for 15 years). Yearly screening should continue until the smoker has stopped smoking for at least 15 years. Yearly screening should be stopped for people who develop a health problem that would prevent them from having lung cancer treatment.  High blood pressure causes heart disease and increases the risk of stroke. Your blood pressure should be checked at least every 1 to 2 years. Ongoing high blood pressure should be treated with medicines if weight loss and exercise do not work.  If you are 55-79 years old, ask your health care provider if you should take aspirin to prevent strokes.  Diabetes screening involves taking a blood sample to check your fasting blood sugar level. This should be done once every 3 years, after age 45, if you are within normal weight and without risk  factors for diabetes. Testing should be considered at a younger age or be carried out more frequently if you are overweight and have at least 1 risk factor for diabetes.  Breast cancer screening is essential preventive care for women. You should practice "breast self-awareness." This means understanding the normal appearance and feel of your breasts and may include breast self-examination. Any changes detected, no matter how small, should be reported to a health care provider. Women in their 20s and 30s should have a clinical breast exam (CBE) by a health care provider as part of a regular health exam every 1 to 3 years. After age 40, women should have a CBE every year. Starting at age 40, women should consider having a mammogram (breast X-ray test) every year. Women who have a family history of breast cancer should talk to their health care provider about genetic screening. Women at a high risk of breast cancer should talk to their health care providers about having an MRI and a mammogram every year.  Breast cancer gene (BRCA)-related cancer risk assessment is recommended for women who have family members with BRCA-related cancers. BRCA-related cancers include breast, ovarian, tubal, and peritoneal cancers. Having family members with these cancers may be associated with an increased risk for harmful changes (mutations) in the breast cancer genes BRCA1 and BRCA2. Results of the assessment will determine the need for genetic counseling and BRCA1 and BRCA2 testing.  Routine pelvic exams to screen for cancer are no longer recommended for nonpregnant women who are considered low risk for cancer of the pelvic organs (ovaries, uterus, and vagina) and who do not have symptoms. Ask your health care provider if a screening pelvic exam is right for you.  If you have had past treatment for cervical cancer or a condition that could lead to cancer, you need Pap tests and screening for cancer for at least 20 years after  your treatment. If Pap tests have been discontinued, your risk factors (such as having a new sexual partner) need to be reassessed to determine if screening should be resumed. Some women have medical problems that increase the chance of getting cervical cancer. In these cases, your health care provider may recommend more frequent screening and Pap tests.  Colorectal cancer can be detected and often prevented. Most routine colorectal cancer screening begins at the age of 50 years and   continues through age 75 years. However, your health care provider may recommend screening at an earlier age if you have risk factors for colon cancer. On a yearly basis, your health care provider may provide home test kits to check for hidden blood in the stool. Use of a small camera at the end of a tube, to directly examine the colon (sigmoidoscopy or colonoscopy), can detect the earliest forms of colorectal cancer. Talk to your health care provider about this at age 50, when routine screening begins. Direct exam of the colon should be repeated every 5-10 years through age 75 years, unless early forms of pre-cancerous polyps or small growths are found.  Hepatitis C blood testing is recommended for all people born from 1945 through 1965 and any individual with known risks for hepatitis C.  Pra  Osteoporosis is a disease in which the bones lose minerals and strength with aging. This can result in serious bone fractures or breaks. The risk of osteoporosis can be identified using a bone density scan. Women ages 65 years and over and women at risk for fractures or osteoporosis should discuss screening with their health care providers. Ask your health care provider whether you should take a calcium supplement or vitamin D to reduce the rate of osteoporosis.  Menopause can be associated with physical symptoms and risks. Hormone replacement therapy is available to decrease symptoms and risks. You should talk to your health care  provider about whether hormone replacement therapy is right for you.  Use sunscreen. Apply sunscreen liberally and repeatedly throughout the day. You should seek shade when your shadow is shorter than you. Protect yourself by wearing long sleeves, pants, a wide-brimmed hat, and sunglasses year round, whenever you are outdoors.  Once a month, do a whole body skin exam, using a mirror to look at the skin on your back. Tell your health care provider of new moles, moles that have irregular borders, moles that are larger than a pencil eraser, or moles that have changed in shape or color.  Stay current with required vaccines (immunizations).  Influenza vaccine. All adults should be immunized every year.  Tetanus, diphtheria, and acellular pertussis (Td, Tdap) vaccine. Pregnant women should receive 1 dose of Tdap vaccine during each pregnancy. The dose should be obtained regardless of the length of time since the last dose. Immunization is preferred during the 27th-36th week of gestation. An adult who has not previously received Tdap or who does not know her vaccine status should receive 1 dose of Tdap. This initial dose should be followed by tetanus and diphtheria toxoids (Td) booster doses every 10 years. Adults with an unknown or incomplete history of completing a 3-dose immunization series with Td-containing vaccines should begin or complete a primary immunization series including a Tdap dose. Adults should receive a Td booster every 10 years.  Varicella vaccine. An adult without evidence of immunity to varicella should receive 2 doses or a second dose if she has previously received 1 dose. Pregnant females who do not have evidence of immunity should receive the first dose after pregnancy. This first dose should be obtained before leaving the health care facility. The second dose should be obtained 4-8 weeks after the first dose.  Human papillomavirus (HPV) vaccine. Females aged 13-26 years who have not  received the vaccine previously should obtain the 3-dose series. The vaccine is not recommended for use in pregnant females. However, pregnancy testing is not needed before receiving a dose. If a female is found to   be pregnant after receiving a dose, no treatment is needed. In that case, the remaining doses should be delayed until after the pregnancy. Immunization is recommended for any person with an immunocompromised condition through the age of 26 years if she did not get any or all doses earlier. During the 3-dose series, the second dose should be obtained 4-8 weeks after the first dose. The third dose should be obtained 24 weeks after the first dose and 16 weeks after the second dose.  Zoster vaccine. One dose is recommended for adults aged 60 years or older unless certain conditions are present.  Measles, mumps, and rubella (MMR) vaccine. Adults born before 1957 generally are considered immune to measles and mumps. Adults born in 1957 or later should have 1 or more doses of MMR vaccine unless there is a contraindication to the vaccine or there is laboratory evidence of immunity to each of the three diseases. A routine second dose of MMR vaccine should be obtained at least 28 days after the first dose for students attending postsecondary schools, health care workers, or international travelers. People who received inactivated measles vaccine or an unknown type of measles vaccine during 1963-1967 should receive 2 doses of MMR vaccine. People who received inactivated mumps vaccine or an unknown type of mumps vaccine before 1979 and are at high risk for mumps infection should consider immunization with 2 doses of MMR vaccine. For females of childbearing age, rubella immunity should be determined. If there is no evidence of immunity, females who are not pregnant should be vaccinated. If there is no evidence of immunity, females who are pregnant should delay immunization until after pregnancy. Unvaccinated  health care workers born before 1957 who lack laboratory evidence of measles, mumps, or rubella immunity or laboratory confirmation of disease should consider measles and mumps immunization with 2 doses of MMR vaccine or rubella immunization with 1 dose of MMR vaccine.  Pneumococcal 13-valent conjugate (PCV13) vaccine. When indicated, a person who is uncertain of her immunization history and has no record of immunization should receive the PCV13 vaccine. An adult aged 19 years or older who has certain medical conditions and has not been previously immunized should receive 1 dose of PCV13 vaccine. This PCV13 should be followed with a dose of pneumococcal polysaccharide (PPSV23) vaccine. The PPSV23 vaccine dose should be obtained at least 8 weeks after the dose of PCV13 vaccine. An adult aged 19 years or older who has certain medical conditions and previously received 1 or more doses of PPSV23 vaccine should receive 1 dose of PCV13. The PCV13 vaccine dose should be obtained 1 or more years after the last PPSV23 vaccine dose.    Pneumococcal polysaccharide (PPSV23) vaccine. When PCV13 is also indicated, PCV13 should be obtained first. All adults aged 65 years and older should be immunized. An adult younger than age 65 years who has certain medical conditions should be immunized. Any person who resides in a nursing home or long-term care facility should be immunized. An adult smoker should be immunized. People with an immunocompromised condition and certain other conditions should receive both PCV13 and PPSV23 vaccines. People with human immunodeficiency virus (HIV) infection should be immunized as soon as possible after diagnosis. Immunization during chemotherapy or radiation therapy should be avoided. Routine use of PPSV23 vaccine is not recommended for American Indians, [place] Natives, or people younger than 65 years unless there are medical conditions that require PPSV23 vaccine. When indicated, people who  have unknown immunization and have no record of   immunization should receive PPSV23 vaccine. One-time revaccination 5 years after the first dose of PPSV23 is recommended for people aged 19-64 years who have chronic kidney failure, nephrotic syndrome, asplenia, or immunocompromised conditions. People who received 1-2 doses of PPSV23 before age 65 years should receive another dose of PPSV23 vaccine at age 65 years or later if at least 5 years have passed since the previous dose. Doses of PPSV23 are not needed for people immunized with PPSV23 at or after age 65 years.  Preventive Services / Frequency   Ages 40 to 64 years  Blood pressure check.  Lipid and cholesterol check.  Lung cancer screening. / Every year if you are aged 55-80 years and have a 30-pack-year history of smoking and currently smoke or have quit within the past 15 years. Yearly screening is stopped once you have quit smoking for at least 15 years or develop a health problem that would prevent you from having lung cancer treatment.  Clinical breast exam.** / Every year after age 40 years.  BRCA-related cancer risk assessment.** / For women who have family members with a BRCA-related cancer (breast, ovarian, tubal, or peritoneal cancers).  Mammogram.** / Every year beginning at age 40 years and continuing for as long as you are in good health. Consult with your health care provider.  Pap test.** / Every 3 years starting at age 30 years through age 65 or 70 years with a history of 3 consecutive normal Pap tests.  HPV screening.** / Every 3 years from ages 30 years through ages 65 to 70 years with a history of 3 consecutive normal Pap tests.  Fecal occult blood test (FOBT) of stool. / Every year beginning at age 50 years and continuing until age 75 years. You may not need to do this test if you get a colonoscopy every 10 years.  Flexible sigmoidoscopy or colonoscopy.** / Every 5 years for a flexible sigmoidoscopy or every 10 years  for a colonoscopy beginning at age 50 years and continuing until age 75 years.  Hepatitis C blood test.** / For all people born from 1945 through 1965 and any individual with known risks for hepatitis C.  Skin self-exam. / Monthly.  Influenza vaccine. / Every year.  Tetanus, diphtheria, and acellular pertussis (Tdap/Td) vaccine.** / Consult your health care provider. Pregnant women should receive 1 dose of Tdap vaccine during each pregnancy. 1 dose of Td every 10 years.  Varicella vaccine.** / Consult your health care provider. Pregnant females who do not have evidence of immunity should receive the first dose after pregnancy.  Zoster vaccine.** / 1 dose for adults aged 60 years or older.  Pneumococcal 13-valent conjugate (PCV13) vaccine.** / Consult your health care provider.  Pneumococcal polysaccharide (PPSV23) vaccine.** / 1 to 2 doses if you smoke cigarettes or if you have certain conditions.  Meningococcal vaccine.** / Consult your health care provider.  Hepatitis A vaccine.** / Consult your health care provider.  Hepatitis B vaccine.** / Consult your health care provider. Screening for abdominal aortic aneurysm (AAA)  by ultrasound is recommended for people over 50 who have history of high blood pressure or who are current or former smokers.  

## 2015-08-25 NOTE — Progress Notes (Signed)
Patient ID: Brandi Erickson, female   DOB: Jul 28, 1970, 45 y.o.   MRN: [id number]  [facility]                   Unk Pinto, M.D.    Uvaldo Bristle. Silverio Lay, P.A.-C      Starlyn Skeans, P.A.-C   [facility]                [address] [place], [place] [id number] Telephone [phone] Telefax [phone]  ______________________________________________________________________  Annual Screening/Preventative Visit And Comprehensive Evaluation &  Examination     This very nice 45 y.o. MBF presents for a Wellness/Preventative Visit & comprehensive evaluation and management of multiple medical co-morbidities.  Patient has been followed expectantly for labile HTN, Prediabetes, Hyperlipidemia and Vitamin D Deficiency.Patient has hx/o Charcot Massie Maroon Dz since age 79 in 35 and is followed at [facility] and is on SS Disability. She describes pains rated at 4-8/10 in her hands & feet.  Patient also has seasonal allergies & Asthma and is on Cetirizine, Montelukast, Pataday Opth and Advair Discus maintenance and prn Albuterol.       Patient is followed expectantly for elevated BP and patient's BP has been controlled at home and patient denies any cardiac symptoms as chest pain, palpitations, shortness of breath, dizziness or ankle swelling. Today's BP: 106/76 mmHg      Patient's hyperlipidemia is not controlled with diet.  Last lipids were not at goal with Cholesterol 197; HDL 67; elevated LDL 118; Triglycerides 62 on 05/07/2015.     Patient has prediabetes predating since June 2016 with A1c 5.7%  and patient denies reactive hypoglycemic symptoms, visual blurring, diabetic polys, or paresthesias. Last A1c was 5.6% on 05/07/2015.     Finally, patient has history of Vitamin D Deficiency of "45" in June 2016 and last Vitamin D was 72 on 05/07/2015.    Medication Sig  . albuterol PROAIR HFA inhaler Inhale 2 puffs into the  lungs every 6 (six) hours as needed for wheezing or shortness of breath.  . cetirizine  10 MG tablet Take 10 mg by mouth every evening.  . fluticasone0.05 % cream Apply 1 application topically 2 (two) times daily.   Marland Kitchen ADVAIR DISKUS 250-50 Inhale 1 puff into the lungs every 12 (twelve) hours.  . Gabapentin  800 MG tablet Take 800 mg by mouth 3 (three) times daily.  . Gabapentin  600 MG TBCR Take by mouth.  . Ginger 500 MG CAPS Take 1 cap daily.  . NORCO 7.5-325 MG TAKE 1 TAB 4 TIMES A DAY AS NEEDED  . methocarbamol  500 MG  Take 1 tab 3  times daily as needed for muscle spasms.  . mirtazapine 45 MG  Take  at bedtime.  . montelukast  10 MG Take at bedtime.  Marland Kitchen PATADAY 0.2 % SOLN Apply to eye.  Marland Kitchen omeprazole  40 MG  Take 1 cap daily.  . Fiber Choice  2 tabs daily  . tranexamic acid (LYSTEDA) 650 MG  Patient takes 2 tabs 2-3 times daily for menses pain.  . Vitamin D 50,000 units CAPS  1 pill daily or as directed. (Patient taking differently: 50,000 Units. Twice a week)   Allergies  Allergen Reactions  . Oxycontin [Oxycodone Hcl] Nausea And Vomiting  . Penicillins Hives  . Sulfa Antibiotics Hives  . Topamax [  Topiramate] Hives   Past Medical History  Diagnosis Date  . Allergy   . Uterine polyp   . Asthma   . Seasonal allergies   . GERD (gastroesophageal reflux disease)   . Neuropathy, peripheral (HCC)     upper and lower extremities seconardy to charcot-marie  tooth disease  . CMT (Charcot-Marie-Tooth disease)   . Wears glasses   . Chronic constipation    Health Maintenance  Topic Date Due  . HIV Screening  06/18/1984  . TETANUS/TDAP  09/11/2015 (Originally 06/18/1988)  . INFLUENZA VACCINE  01/20/2017 (Originally 10/28/2015)  . PAP SMEAR  09/13/2016   Past Surgical History  Procedure Laterality Date  . Esophageal manometry  05/17/2011    Procedure: ESOPHAGEAL MANOMETRY (EM);  Surgeon: Beryle Beams, MD  . Tonsillectomy    . Tubal reconstruction  1997  . Tubal ligation  1999  .  Dilation and curettage of uterus    . Colonoscopy N/A 11/01/2012    Procedure: COLONOSCOPY;  Surgeon: Rogene Houston, MD  . Foot neuroma surgery Right 06-05-2013  . Tonsillectomy  as child  . Hysteroscopy w/d&c N/A 06/27/2013    Procedure: HYSTEROSCOPY POLPYECTOMY  ;  Surgeon: Governor Specking, MD  . Laparoscopy N/A 06/27/2013    Procedure: LAPAROSCOPY WITH extensive lysis of adhesions;  Surgeon: Governor Specking, MD   Social History  Substance Use Topics  . Smoking status: Never Smoker   . Smokeless tobacco: Never Used  . Alcohol Use: No    ROS Constitutional: Denies fever, chills, weight loss/gain, headaches, insomnia,  night sweats, and change in appetite. Does c/o fatigue. Eyes: Denies redness, blurred vision, diplopia, discharge, itchy, watery eyes.  ENT: Denies discharge, congestion, post nasal drip, epistaxis, sore throat, earache, hearing loss, dental pain, Tinnitus, Vertigo, Sinus pain, snoring.  Cardio: Denies chest pain, palpitations, irregular heartbeat, syncope, dyspnea, diaphoresis, orthopnea, PND, claudication, edema Respiratory: denies cough, dyspnea, DOE, pleurisy, hoarseness, laryngitis, wheezing.  Gastrointestinal: Denies dysphagia, heartburn, reflux, water brash, pain, cramps, nausea, vomiting, bloating, diarrhea, constipation, hematemesis, melena, hematochezia, jaundice, hemorrhoids Genitourinary: Denies dysuria, frequency, urgency, nocturia, hesitancy, discharge, hematuria, flank pain Breast: Breast lumps, nipple discharge, bleeding.  Musculoskeletal: Denies arthralgia, myalgia, stiffness, Jt. Swelling, pain, limp, and strain/sprain. Denies falls. Skin: Denies puritis, rash, hives, warts, acne, eczema, changing in skin lesion Neuro: No weakness, tremor, incoordination, spasms, paresthesia, pain Psychiatric: Denies confusion, memory loss, sensory loss. Denies Depression. Endocrine: Denies change in weight, skin, hair change, nocturia, and paresthesia, diabetic polys,  visual blurring, hyper / hypo glycemic episodes.  Heme/Lymph: No excessive bleeding, bruising, enlarged lymph nodes.  Physical Exam  BP 106/76 mmHg  Pulse 64  Temp(Src) 97.5 F (36.4 C)  Resp 16  Ht 5' 5.5" (1.664 m)  Wt 152 lb 12.8 oz (69.31 kg)  BMI 25.03 kg/m2  General Appearance: Well nourished and in no apparent distress.  Eyes: PERRLA, EOMs, conjunctiva no swelling or erythema, normal fundi and vessels. Sinuses: No frontal/maxillary tenderness ENT/Mouth: EACs patent / TMs  nl. Nares clear without erythema, swelling, mucoid exudates. Oral hygiene is good. No erythema, swelling, or exudate. Tongue normal, non-obstructing. Tonsils not swollen or erythematous. Hearing normal.  Neck: Supple, thyroid normal. No bruits, nodes or JVD. Respiratory: Respiratory effort normal.  BS equal and clear bilateral without rales, rhonci, wheezing or stridor. Cardio: Heart sounds are normal with regular rate and rhythm and no murmurs, rubs or gallops. Peripheral pulses are normal and equal bilaterally without edema. No aortic or femoral bruits. Chest: symmetric with normal excursions and  percussion. Breasts: Symmetric, without lumps, nipple discharge, retractions, or fibrocystic changes.  Abdomen: Flat, soft with bowel sounds active. Nontender, no guarding, rebound, hernias, masses, or organomegaly.  Lymphatics: Non tender without lymphadenopathy.  Genitourinary:  Musculoskeletal: Full ROM all peripheral extremities, joint stability, 5/5 strength, and normal gait. Skin: Warm and dry without rashes, lesions, cyanosis, clubbing or  ecchymosis.  Neuro: Cranial nerves intact, reflexes 1+ equal bilaterally. Normal muscle tone, no cerebellar symptoms. Sensation intact.  Pysch: Alert and oriented X 3, normal affect, Insight and Judgment appropriate.   Assessment and Plan  1. Annual Preventative Screening Examination  2. Elevated BP  - Microalbumin / creatinine urine ratio - EKG 12-Lead - TSH  3.  Mixed hyperlipidemia  - Lipid panel - TSH  4. Prediabetes  - Hemoglobin A1c - Insulin, random  5. Vitamin D deficiency  - VITAMIN D 25 Hydroxy   6. Gastroesophageal reflux disease  7. Screening for rectal cancer  - POC Hemoccult Bld/Stl   8. Other fatigue  - CBC with Differential/Platelet - TSH  9. Medication management  - Urinalysis, Routine w reflex microscopic  - CBC with Differential/Platelet - BASIC METABOLIC PANEL WITH GFR - Hepatic function panel - Magnesium  10. Asthma, mild intermittent, uncomplicated  - fluticasone furoate-vilanterol (BREO ELLIPTA) 200-25 MCG/INH AEPB; Inhale 1 puff into the lungs daily. Use 1 inhalation daily  (to try in lieu of Advair)  11. Screening for ischemic heart disease   Continue prudent diet as discussed, weight control, BP monitoring, regular exercise, and medications. Discussed med's effects and SE's. Screening labs and tests as requested with regular follow-up as recommended. Over 40 minutes of exam, counseling, chart review and high complex critical decision making was performed.

## 2015-08-26 ENCOUNTER — Encounter: Payer: Self-pay | Admitting: Internal Medicine

## 2015-08-26 ENCOUNTER — Ambulatory Visit (INDEPENDENT_AMBULATORY_CARE_PROVIDER_SITE_OTHER): Payer: 59 | Admitting: Internal Medicine

## 2015-08-26 VITALS — BP 106/76 | HR 64 | Temp 97.5°F | Resp 16 | Ht 65.5 in | Wt 152.8 lb

## 2015-08-26 DIAGNOSIS — R7303 Prediabetes: Secondary | ICD-10-CM

## 2015-08-26 DIAGNOSIS — Z136 Encounter for screening for cardiovascular disorders: Secondary | ICD-10-CM

## 2015-08-26 DIAGNOSIS — Z0000 Encounter for general adult medical examination without abnormal findings: Secondary | ICD-10-CM | POA: Diagnosis not present

## 2015-08-26 DIAGNOSIS — Z0001 Encounter for general adult medical examination with abnormal findings: Secondary | ICD-10-CM

## 2015-08-26 DIAGNOSIS — Z79899 Other long term (current) drug therapy: Secondary | ICD-10-CM

## 2015-08-26 DIAGNOSIS — Z111 Encounter for screening for respiratory tuberculosis: Secondary | ICD-10-CM | POA: Diagnosis not present

## 2015-08-26 DIAGNOSIS — Z1212 Encounter for screening for malignant neoplasm of rectum: Secondary | ICD-10-CM

## 2015-08-26 DIAGNOSIS — E782 Mixed hyperlipidemia: Secondary | ICD-10-CM

## 2015-08-26 DIAGNOSIS — I10 Essential (primary) hypertension: Secondary | ICD-10-CM | POA: Diagnosis not present

## 2015-08-26 DIAGNOSIS — E559 Vitamin D deficiency, unspecified: Secondary | ICD-10-CM

## 2015-08-26 DIAGNOSIS — R5383 Other fatigue: Secondary | ICD-10-CM

## 2015-08-26 DIAGNOSIS — IMO0001 Reserved for inherently not codable concepts without codable children: Secondary | ICD-10-CM

## 2015-08-26 DIAGNOSIS — K219 Gastro-esophageal reflux disease without esophagitis: Secondary | ICD-10-CM

## 2015-08-26 DIAGNOSIS — J4520 Mild intermittent asthma, uncomplicated: Secondary | ICD-10-CM

## 2015-08-26 DIAGNOSIS — R030 Elevated blood-pressure reading, without diagnosis of hypertension: Secondary | ICD-10-CM

## 2015-08-26 LAB — CBC WITH DIFFERENTIAL/PLATELET
Basophils Absolute: 39 cells/uL (ref 0–200)
Basophils Relative: 1 %
Eosinophils Absolute: 78 cells/uL (ref 15–500)
Eosinophils Relative: 2 %
HCT: 38.7 % (ref 35.0–45.0)
Hemoglobin: 12.3 g/dL (ref 11.7–15.5)
Lymphocytes Relative: 48 %
Lymphs Abs: 1872 cells/uL (ref 850–3900)
MCH: 25.8 pg — ABNORMAL LOW (ref 27.0–33.0)
MCHC: 31.8 g/dL — ABNORMAL LOW (ref 32.0–36.0)
MCV: 81.3 fL (ref 80.0–100.0)
MPV: 9.7 fL (ref 7.5–12.5)
Monocytes Absolute: 312 cells/uL (ref 200–950)
Monocytes Relative: 8 %
Neutro Abs: 1599 cells/uL (ref 1500–7800)
Neutrophils Relative %: 41 %
Platelets: 372 10*3/uL (ref 140–400)
RBC: 4.76 MIL/uL (ref 3.80–5.10)
RDW: 15.2 % — ABNORMAL HIGH (ref 11.0–15.0)
WBC: 3.9 10*3/uL (ref 3.8–10.8)

## 2015-08-26 LAB — HEPATIC FUNCTION PANEL
ALT: 18 U/L (ref 6–29)
AST: 22 U/L (ref 10–35)
Albumin: 4 g/dL (ref 3.6–5.1)
Alkaline Phosphatase: 65 U/L (ref 33–115)
Bilirubin, Direct: 0.1 mg/dL (ref <=0.2)
Indirect Bilirubin: 0.3 mg/dL (ref 0.2–1.2)
Total Bilirubin: 0.4 mg/dL (ref 0.2–1.2)
Total Protein: 6.8 g/dL (ref 6.1–8.1)

## 2015-08-26 LAB — TSH: TSH: 1.63 mIU/L

## 2015-08-26 LAB — BASIC METABOLIC PANEL WITH GFR
BUN: 10 mg/dL (ref 7–25)
CO2: 27 mmol/L (ref 20–31)
Calcium: 9.2 mg/dL (ref 8.6–10.2)
Chloride: 104 mmol/L (ref 98–110)
Creat: 0.71 mg/dL (ref 0.50–1.10)
GFR, Est African American: >89 mL/min (ref >=60)
GFR, Est Non African American: >89 mL/min (ref >=60)
Glucose, Bld: 89 mg/dL (ref 65–99)
Potassium: 4.4 mmol/L (ref 3.5–5.3)
Sodium: 139 mmol/L (ref 135–146)

## 2015-08-26 LAB — LIPID PANEL
Cholesterol: 183 mg/dL (ref 125–200)
HDL: 62 mg/dL (ref >=46)
LDL Cholesterol: 105 mg/dL (ref <130)
Total CHOL/HDL Ratio: 3 Ratio (ref <=5.0)
Triglycerides: 79 mg/dL (ref <150)
VLDL: 16 mg/dL (ref <30)

## 2015-08-26 LAB — HEMOGLOBIN A1C
Hgb A1c MFr Bld: 5.7 % — ABNORMAL HIGH (ref <5.7)
Mean Plasma Glucose: 117 mg/dL

## 2015-08-26 LAB — MAGNESIUM: Magnesium: 1.9 mg/dL (ref 1.5–2.5)

## 2015-08-26 MED ORDER — FLUTICASONE FUROATE-VILANTEROL 200-25 MCG/INH IN AEPB: 1.0000 | INHALATION_SPRAY | Freq: Every day | RESPIRATORY_TRACT | Status: DC

## 2015-08-27 LAB — VITAMIN D 25 HYDROXY (VIT D DEFICIENCY, FRACTURES): Vit D, 25-Hydroxy: 74 ng/mL (ref 30–100)

## 2015-08-27 LAB — URINALYSIS, ROUTINE W REFLEX MICROSCOPIC
Bilirubin Urine: NEGATIVE
Glucose, UA: NEGATIVE
Hgb urine dipstick: NEGATIVE
Ketones, ur: NEGATIVE
Leukocytes, UA: NEGATIVE
Nitrite: NEGATIVE
Protein, ur: NEGATIVE
Specific Gravity, Urine: 1.024 (ref 1.001–1.035)
pH: 6 (ref 5.0–8.0)

## 2015-08-27 LAB — MICROALBUMIN / CREATININE URINE RATIO
Creatinine, Urine: 190 mg/dL (ref 20–320)
Microalb Creat Ratio: 2 mcg/mg creat (ref <30)
Microalb, Ur: 0.4 mg/dL

## 2015-08-27 LAB — INSULIN, RANDOM: Insulin: 6 u[IU]/mL (ref 2.0–19.6)

## 2015-08-28 LAB — TB SKIN TEST
Induration: 0 mm
TB Skin Test: NEGATIVE

## 2015-09-12 ENCOUNTER — Other Ambulatory Visit: Payer: Self-pay | Admitting: Internal Medicine

## 2015-09-12 MED ORDER — SCOPOLAMINE 1 MG/3DAYS TD PT72: 1.0000 | MEDICATED_PATCH | TRANSDERMAL | Status: DC

## 2015-09-21 ENCOUNTER — Encounter: Payer: Self-pay | Admitting: *Deleted

## 2015-10-31 ENCOUNTER — Ambulatory Visit: Payer: Self-pay | Admitting: Licensed Clinical Social Worker

## 2015-11-03 ENCOUNTER — Ambulatory Visit: Payer: Self-pay | Admitting: Licensed Clinical Social Worker

## 2015-11-04 ENCOUNTER — Ambulatory Visit (INDEPENDENT_AMBULATORY_CARE_PROVIDER_SITE_OTHER): Payer: Medicare Other | Admitting: Licensed Clinical Social Worker

## 2015-11-04 DIAGNOSIS — F39 Unspecified mood [affective] disorder: Secondary | ICD-10-CM | POA: Diagnosis not present
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
## 2015-11-18 ENCOUNTER — Ambulatory Visit (INDEPENDENT_AMBULATORY_CARE_PROVIDER_SITE_OTHER): Payer: Medicare Other | Admitting: Licensed Clinical Social Worker

## 2015-11-18 DIAGNOSIS — F39 Unspecified mood [affective] disorder: Secondary | ICD-10-CM

## 2015-11-28 ENCOUNTER — Ambulatory Visit (INDEPENDENT_AMBULATORY_CARE_PROVIDER_SITE_OTHER): Payer: Medicare Other | Admitting: Internal Medicine

## 2015-11-28 ENCOUNTER — Encounter: Payer: Self-pay | Admitting: Internal Medicine

## 2015-11-28 VITALS — BP 124/80 | HR 70 | Temp 98.2°F | Resp 16 | Ht 65.5 in | Wt 152.0 lb

## 2015-11-28 DIAGNOSIS — E559 Vitamin D deficiency, unspecified: Secondary | ICD-10-CM | POA: Diagnosis not present

## 2015-11-28 DIAGNOSIS — IMO0001 Reserved for inherently not codable concepts without codable children: Secondary | ICD-10-CM

## 2015-11-28 DIAGNOSIS — R7303 Prediabetes: Secondary | ICD-10-CM

## 2015-11-28 DIAGNOSIS — M542 Cervicalgia: Secondary | ICD-10-CM

## 2015-11-28 DIAGNOSIS — E782 Mixed hyperlipidemia: Secondary | ICD-10-CM

## 2015-11-28 DIAGNOSIS — G600 Hereditary motor and sensory neuropathy: Secondary | ICD-10-CM | POA: Diagnosis not present

## 2015-11-28 DIAGNOSIS — D259 Leiomyoma of uterus, unspecified: Secondary | ICD-10-CM | POA: Diagnosis not present

## 2015-11-28 DIAGNOSIS — R030 Elevated blood-pressure reading, without diagnosis of hypertension: Secondary | ICD-10-CM

## 2015-11-28 MED ORDER — NIFEDIPINE ER OSMOTIC RELEASE 30 MG PO TB24: 30.0000 mg | ORAL_TABLET | Freq: Every day | ORAL | 1 refills | Status: DC

## 2015-11-28 MED ORDER — VITAMIN D (ERGOCALCIFEROL) 1.25 MG (50000 UNIT) PO CAPS: ORAL_CAPSULE | ORAL | 0 refills | Status: DC

## 2015-11-28 MED ORDER — GABAPENTIN 800 MG PO TABS: 800.0000 mg | ORAL_TABLET | Freq: Three times a day (TID) | ORAL | 0 refills | Status: DC

## 2015-11-28 NOTE — Progress Notes (Signed)
Assessment and Plan:  Hypertension:  -blood pressure in good control -medications refilled -Continue medication,  -monitor blood pressure at home.  -Continue DASH diet.   -Reminder to go to the ER if any CP, SOB, nausea, dizziness, severe HA, changes vision/speech, left arm numbness and tingling, and jaw pain.  Cholesterol: -Continue diet and exercise.   Pre-diabetes: -Continue diet and exercise.   Vitamin D Def: -continue medications.   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 Further disposition pending results of labs.  HPI 45 y.o. female  presents for 3 month follow up with hypertension, hyperlipidemia, prediabetes and vitamin D.   Her blood pressure has been controlled at home, today their BP is BP: 124/80.   She does workout. She denies chest pain, shortness of breath, dizziness.   She is on cholesterol medication and denies myalgias. Her cholesterol is at goal. The cholesterol last visit was:   Lab Results  Component Value Date   CHOL 183 08/26/2015   HDL 62 08/26/2015   LDLCALC 105 08/26/2015   TRIG 79 08/26/2015   CHOLHDL 3.0 08/26/2015     She has been working on diet and exercise for prediabetes, and denies foot ulcerations, hyperglycemia, hypoglycemia , increased appetite, nausea, paresthesia of the feet, polydipsia, polyuria, visual disturbances, vomiting and weight loss. Last A1C in the office was:  Lab Results  Component Value Date   HGBA1C 5.7 (H) 08/26/2015    Patient is on Vitamin D supplement.  Lab Results  Component Value Date   VD25OH 74 08/26/2015      Patient reports that she has been  having some issues with her neck.  She had a cortisone injection done by preferred management.  She reports that she is going to try to stop over there today.  She reports that she is having some continued pain.    She is having fibroids in her uterus.  She reports that she has been having some heavy bleeding.  She reports that she does not want to have a hysterctomy because she thinks it will make her CMT worse.    Current Medications:  Current Outpatient Prescriptions on File Prior to Visit  Medication Sig Dispense Refill  . albuterol (PROAIR HFA) 108 (90 BASE) MCG/ACT inhaler Inhale 2 puffs into the lungs every 6 (six) hours as needed for wheezing or shortness of breath.    . cetirizine (ZYRTEC) 10 MG tablet Take 10 mg by mouth every evening.    . fluticasone (CUTIVATE) 0.05 % cream Apply 1 application topically 2 (two) times daily.     . fluticasone furoate-vilanterol (BREO ELLIPTA) 200-25 MCG/INH AEPB Inhale 1 puff into the lungs daily. Use 1 inhalation daily 60 each 11  . Fluticasone-Salmeterol (ADVAIR DISKUS) 250-50 MCG/DOSE AEPB Inhale 1 puff into the lungs every 12 (twelve) hours.    . gabapentin (NEURONTIN) 800 MG tablet Take 800 mg by mouth 3 (three) times daily.    . Ginger 500 MG CAPS Take 1 capsule by mouth daily.    Marland Kitchen HYDROcodone-acetaminophen (NORCO) 7.5-325 MG tablet TAKE 1 TABLET BY MOUTH 4 TIMES A DAY AS NEEDED  0  . methocarbamol (ROBAXIN) 500 MG tablet Take 1  tablet (500 mg total) by mouth 3 (three) times daily as needed for muscle spasms. 270 tablet 1  . montelukast (SINGULAIR) 10 MG tablet Take 10 mg by mouth at bedtime.    . Olopatadine HCl (PATADAY) 0.2 % SOLN Apply to eye.    Marland Kitchen omeprazole (PRILOSEC) 40 MG capsule Take 1 capsule (40 mg total) by mouth daily. 90 capsule 3  . OVER THE COUNTER MEDICATION Fiber Choice 2 tabs daily    . tranexamic acid (LYSTEDA) 650 MG TABS tablet Take 650 mg by mouth 3 (three) times daily. Patient takes 2 tabs 2-3 times daily for menses pain.     . Vitamin D, Ergocalciferol, (DRISDOL) 50000 units CAPS capsule 1 pill daily or as directed. (Patient taking differently: 50,000 Units. Twice a week) 90 capsule 0   No current facility-administered medications on file prior to visit.     Medical History:  Past Medical History:  Diagnosis Date  . Allergy   . Asthma   . Chronic constipation   . CMT (Charcot-Marie-Tooth disease)   . GERD (gastroesophageal reflux disease)   . Neuropathy, peripheral (HCC)    upper and lower extremities seconardy to scharcot-marie  tooth disease  . Seasonal allergies   . Uterine polyp   . Wears glasses     Allergies:  Allergies  Allergen Reactions  . Linzess [Linaclotide] Rash  . Oxycontin [Oxycodone Hcl] Nausea And Vomiting  . Penicillins Hives  . Sulfa Antibiotics Hives  . Topamax [Topiramate] Hives     Review of Systems:  Review of Systems  Constitutional: Negative for chills, fever and malaise/fatigue.  HENT: Negative for congestion, ear pain and sore throat.   Eyes: Negative.   Respiratory: Negative for cough, shortness of breath and wheezing.   Cardiovascular: Negative for chest pain, palpitations and leg swelling.  Gastrointestinal: Negative for abdominal pain, blood in stool, constipation, diarrhea, heartburn and melena.  Genitourinary: Negative.   Musculoskeletal: Positive for neck pain.  Skin: Negative.   Neurological: Positive for sensory change. Negative for dizziness, loss of consciousness and headaches.  Psychiatric/Behavioral: Negative for depression. The patient is not nervous/anxious and does not have insomnia.     Family history- Review and unchanged  Social history- Review and unchanged  Physical Exam: BP 124/80   Pulse 70   Temp 98.2 F (36.8 C) (Temporal)   Resp 16   Ht 5' 5.5" (1.664 m)   Wt 152 lb (68.9 kg)   LMP 11/06/2015   BMI 24.91 kg/m  Wt Readings from Last 3 Encounters:  11/28/15 152 lb (68.9 kg)  08/26/15 152 lb 12.8 oz (69.3 kg)  07/28/15  154 lb 9.6 oz (70.1 kg)    General Appearance: Well nourished well developed, in no apparent distress. Eyes: PERRLA, EOMs, conjunctiva no swelling or erythema ENT/Mouth: Ear canals normal without obstruction, swelling, erythma, discharge.  TMs normal bilaterally.  Oropharynx moist, clear, without exudate, or postoropharyngeal swelling. Neck: Supple, thyroid normal,no cervical adenopathy  Respiratory: Respiratory effort normal, Breath sounds clear A&P without rhonchi, wheeze, or rale.  No retractions, no accessory usage. Cardio: RRR with no MRGs. Brisk peripheral pulses without edema.  Abdomen: Soft, + BS,  Non tender, no guarding, rebound, hernias, masses. Musculoskeletal: Full ROM, 5/5 strength, Normal gait Skin: Warm, dry without rashes, lesions, ecchymosis.  Neuro: Awake and oriented X 3, Cranial nerves intact. Normal muscle tone, no cerebellar symptoms. Psych: Normal affect, Insight and Judgment appropriate.    Starlyn Skeans, PA-C 9:08 AM [facility]

## 2015-12-04 NOTE — Progress Notes (Signed)
   THERAPIST PROGRESS NOTE  Session Time: 52  Participation Level: Active  Behavioral Response: NeatAlertEuthymic  Type of Therapy: Individual Therapy  Treatment Goals addressed: Coping and Diagnosis: Depression  Interventions: CBT, Motivational Interviewing, Solution Focused, Strength-based, Family Systems and Reframing  Summary: Brandi Erickson is a 45 y.o. female who presents with symptoms of her diagnosis.  LCSW discussed what psychotherapy is and is not and the importance of the therapeutic relationship to include open and honest communication between client and therapist and building trust.  Reviewed advantages and disadvantages of the therapeutic process and limitations to the therapeutic relationship including LCSW's role in maintaining the safety of the client, others and those in client's care. Therapist met with Patient in an initial therapy session to assess current mood and to build rapport. Therapist engaged Patient in discussion about her life and what is going well for her. Therapist provided support for Patient as she shared details about her life, her current stressors, mood, coping skills, her past, and her children. Therapist prompted Patient to discuss her support system and ways that she manages her daily stress, anger, and frustrations.    Suicidal/Homicidal: Nowithout intent/plan  Therapist Response: LCSW provided Patient with ongoing emotional support and encouragement.  Normalized her feelings.  Commended Patient on her progress and reinforced the importance of client staying focused on her own strengths and resources and resiliency. Processed various strategies for dealing with stressors.    Plan: Return again in 2 weeks.  Diagnosis: Axis I: Mood Disorder    Axis II: No diagnosis    Lubertha South, LCSW 11/18/2015

## 2015-12-19 ENCOUNTER — Ambulatory Visit (INDEPENDENT_AMBULATORY_CARE_PROVIDER_SITE_OTHER): Payer: Medicare Other | Admitting: Licensed Clinical Social Worker

## 2015-12-19 DIAGNOSIS — F39 Unspecified mood [affective] disorder: Secondary | ICD-10-CM
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
## 2015-12-22 ENCOUNTER — Other Ambulatory Visit: Payer: Self-pay | Admitting: Internal Medicine

## 2016-01-01 ENCOUNTER — Ambulatory Visit (HOSPITAL_COMMUNITY): Payer: 59 | Admitting: Psychiatry

## 2016-01-13 ENCOUNTER — Ambulatory Visit (INDEPENDENT_AMBULATORY_CARE_PROVIDER_SITE_OTHER): Payer: Medicare Other | Admitting: Licensed Clinical Social Worker

## 2016-01-13 DIAGNOSIS — F39 Unspecified mood [affective] disorder: Secondary | ICD-10-CM

## 2016-01-13 NOTE — Progress Notes (Signed)
   THERAPIST PROGRESS NOTE  Session Time: 38min  Participation Level: Active  Behavioral Response: CasualAlertDepressed  Type of Therapy: Individual Therapy  Treatment Goals addressed: Coping and Diagnosis: Mood Disorder  Interventions: CBT, Motivational Interviewing, Solution Focused, Strength-based and Supportive  Summary: Brandi Erickson is a 45 y.o. female who presents with continued symptoms of her diagnosis.  Patient reports that things are going well.  She reports that she may need to have surgery to remove fibroids.  She reports that she is concerned that it may affect her Charcot Lelan Pons Tooth Syndrome.  She reports that she does not want to be in a wheel chair.  She reports that she continues to have difficulty with communicating with her husband.  She reports continued conflict with her husband's family.  She reports going to empty rooms in order to remove herself from him.  She was able to describe fun times with her husband such as him transporting her to her appointments in [place].  She describes her relationship as "love hate."  She reports that she will not divorce him.  Reports that she is able to interact with her friends.  Suicidal/Homicidal: No  Therapist Response: LCSW provided Patient with ongoing emotional support and encouragement.  Normalized her feelings.  Commended Patient on her progress and reinforced the importance of client staying focused on her own strengths and resources and resiliency. Processed various strategies for dealing with stressors.    Plan: Return again in 2 weeks.  Diagnosis: Axis I: Mild Mood Disorder    Axis II: No diagnosis    Lubertha South, LCSW 01/13/2016

## 2016-01-18 ENCOUNTER — Emergency Department
Admission: EM | Admit: 2016-01-18 | Discharge: 2016-01-18 | Disposition: A | Discharge status: Home / Self Care | Payer: Medicare Other | Attending: Emergency Medicine | Admitting: Emergency Medicine

## 2016-01-18 ENCOUNTER — Encounter: Payer: Self-pay | Admitting: Emergency Medicine

## 2016-01-18 DIAGNOSIS — J45909 Unspecified asthma, uncomplicated: Secondary | ICD-10-CM | POA: Diagnosis not present

## 2016-01-18 DIAGNOSIS — Z5321 Procedure and treatment not carried out due to patient leaving prior to being seen by health care provider: Secondary | ICD-10-CM | POA: Diagnosis not present

## 2016-01-18 DIAGNOSIS — R51 Headache: Secondary | ICD-10-CM | POA: Diagnosis present

## 2016-01-18 DIAGNOSIS — Z79899 Other long term (current) drug therapy: Secondary | ICD-10-CM | POA: Insufficient documentation
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
## 2016-02-12 ENCOUNTER — Ambulatory Visit: Payer: Medicare Other | Admitting: Licensed Clinical Social Worker

## 2016-02-12 DIAGNOSIS — F39 Unspecified mood [affective] disorder: Secondary | ICD-10-CM
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
## 2016-03-03 ENCOUNTER — Ambulatory Visit (INDEPENDENT_AMBULATORY_CARE_PROVIDER_SITE_OTHER): Payer: Medicare Other | Admitting: Physician Assistant

## 2016-03-03 ENCOUNTER — Encounter: Payer: Self-pay | Admitting: Physician Assistant

## 2016-03-03 ENCOUNTER — Other Ambulatory Visit: Payer: Self-pay

## 2016-03-03 ENCOUNTER — Ambulatory Visit: Payer: Self-pay | Admitting: Internal Medicine

## 2016-03-03 VITALS — BP 124/66 | Temp 97.5°F | Resp 16 | Ht 65.5 in | Wt 151.2 lb

## 2016-03-03 DIAGNOSIS — J0100 Acute maxillary sinusitis, unspecified: Secondary | ICD-10-CM | POA: Diagnosis not present

## 2016-03-03 DIAGNOSIS — Z79899 Other long term (current) drug therapy: Secondary | ICD-10-CM

## 2016-03-03 DIAGNOSIS — E782 Mixed hyperlipidemia: Secondary | ICD-10-CM

## 2016-03-03 DIAGNOSIS — I10 Essential (primary) hypertension: Secondary | ICD-10-CM

## 2016-03-03 DIAGNOSIS — M542 Cervicalgia: Secondary | ICD-10-CM

## 2016-03-03 DIAGNOSIS — R6889 Other general symptoms and signs: Secondary | ICD-10-CM | POA: Diagnosis not present

## 2016-03-03 DIAGNOSIS — D259 Leiomyoma of uterus, unspecified: Secondary | ICD-10-CM

## 2016-03-03 DIAGNOSIS — K219 Gastro-esophageal reflux disease without esophagitis: Secondary | ICD-10-CM | POA: Diagnosis not present

## 2016-03-03 DIAGNOSIS — E559 Vitamin D deficiency, unspecified: Secondary | ICD-10-CM | POA: Diagnosis not present

## 2016-03-03 DIAGNOSIS — Z0000 Encounter for general adult medical examination without abnormal findings: Secondary | ICD-10-CM

## 2016-03-03 DIAGNOSIS — R7303 Prediabetes: Secondary | ICD-10-CM

## 2016-03-03 DIAGNOSIS — Z0001 Encounter for general adult medical examination with abnormal findings: Secondary | ICD-10-CM

## 2016-03-03 DIAGNOSIS — G600 Hereditary motor and sensory neuropathy: Secondary | ICD-10-CM | POA: Diagnosis not present

## 2016-03-03 LAB — CBC WITH DIFFERENTIAL/PLATELET
Basophils Absolute: 0 cells/uL (ref 0–200)
Basophils Relative: 0 %
Eosinophils Absolute: 46 cells/uL (ref 15–500)
Eosinophils Relative: 1 %
HCT: 38.7 % (ref 35.0–45.0)
Hemoglobin: 12.2 g/dL (ref 11.7–15.5)
Lymphocytes Relative: 34 %
Lymphs Abs: 1564 cells/uL (ref 850–3900)
MCH: 25.3 pg — ABNORMAL LOW (ref 27.0–33.0)
MCHC: 31.5 g/dL — ABNORMAL LOW (ref 32.0–36.0)
MCV: 80.3 fL (ref 80.0–100.0)
MPV: 9 fL (ref 7.5–12.5)
Monocytes Absolute: 506 cells/uL (ref 200–950)
Monocytes Relative: 11 %
Neutro Abs: 2484 cells/uL (ref 1500–7800)
Neutrophils Relative %: 54 %
Platelets: 346 10*3/uL (ref 140–400)
RBC: 4.82 MIL/uL (ref 3.80–5.10)
RDW: 14.3 % (ref 11.0–15.0)
WBC: 4.6 10*3/uL (ref 3.8–10.8)

## 2016-03-03 LAB — HEPATIC FUNCTION PANEL
ALT: 42 U/L — ABNORMAL HIGH (ref 6–29)
AST: 41 U/L — ABNORMAL HIGH (ref 10–35)
Albumin: 4.2 g/dL (ref 3.6–5.1)
Alkaline Phosphatase: 60 U/L (ref 33–115)
Bilirubin, Direct: 0.1 mg/dL (ref <=0.2)
Indirect Bilirubin: 0.4 mg/dL (ref 0.2–1.2)
Total Bilirubin: 0.5 mg/dL (ref 0.2–1.2)
Total Protein: 7.2 g/dL (ref 6.1–8.1)

## 2016-03-03 LAB — BASIC METABOLIC PANEL WITH GFR
BUN: 8 mg/dL (ref 7–25)
CO2: 21 mmol/L (ref 20–31)
Calcium: 9.3 mg/dL (ref 8.6–10.2)
Chloride: 103 mmol/L (ref 98–110)
Creat: 0.75 mg/dL (ref 0.50–1.10)
GFR, Est African American: >89 mL/min (ref >=60)
GFR, Est Non African American: >89 mL/min (ref >=60)
Glucose, Bld: 84 mg/dL (ref 65–99)
Potassium: 4 mmol/L (ref 3.5–5.3)
Sodium: 136 mmol/L (ref 135–146)

## 2016-03-03 LAB — LIPID PANEL
Cholesterol: 203 mg/dL — ABNORMAL HIGH (ref <200)
HDL: 67 mg/dL (ref >50)
LDL Cholesterol: 124 mg/dL — ABNORMAL HIGH (ref <100)
Total CHOL/HDL Ratio: 3 Ratio (ref <5.0)
Triglycerides: 62 mg/dL (ref <150)
VLDL: 12 mg/dL (ref <30)

## 2016-03-03 LAB — TSH: TSH: 1.42 mIU/L

## 2016-03-03 LAB — MAGNESIUM: Magnesium: 2 mg/dL (ref 1.5–2.5)

## 2016-03-03 MED ORDER — VITAMIN D (ERGOCALCIFEROL) 1.25 MG (50000 UNIT) PO CAPS: ORAL_CAPSULE | ORAL | 1 refills | Status: DC

## 2016-03-03 MED ORDER — METHOCARBAMOL 500 MG PO TABS: 500.0000 mg | ORAL_TABLET | Freq: Three times a day (TID) | ORAL | 1 refills | Status: DC | PRN

## 2016-03-03 MED ORDER — OMEPRAZOLE 40 MG PO CPDR: 40.0000 mg | DELAYED_RELEASE_CAPSULE | Freq: Every day | ORAL | 3 refills | Status: DC

## 2016-03-03 NOTE — Patient Instructions (Signed)
Can do a steroid nasal spary 1-2 sparys at night each nostril. Remember to spray each nostril twice towards the outer part of your eye.  Do not sniff but instead pinch your nose and tilt your head back to help the medicine get into your sinuses.  The best time to do this is at bedtime. Stop if you get blurred vision or nose bleeds.   Pharyngitis Pharyngitis is redness, pain, and swelling (inflammation) of your pharynx. What are the causes? Pharyngitis is usually caused by infection. Most of the time, these infections are from viruses (viral) and are part of a cold. However, sometimes pharyngitis is caused by bacteria (bacterial). Pharyngitis can also be caused by allergies. Viral pharyngitis may be spread from person to person by coughing, sneezing, and personal items or utensils (cups, forks, spoons, toothbrushes). Bacterial pharyngitis may be spread from person to person by more intimate contact, such as kissing. What are the signs or symptoms? Symptoms of pharyngitis include:  Sore throat.  Tiredness (fatigue).  Low-grade fever.  Headache.  Joint pain and muscle aches.  Skin rashes.  Swollen lymph nodes.  Plaque-like film on throat or tonsils (often seen with bacterial pharyngitis). How is this diagnosed? Your health care provider will ask you questions about your illness and your symptoms. Your medical history, along with a physical exam, is often all that is needed to diagnose pharyngitis. Sometimes, a rapid strep test is done. Other lab tests may also be done, depending on the suspected cause. How is this treated? Viral pharyngitis will usually get better in 3-4 days without the use of medicine. Bacterial pharyngitis is treated with medicines that kill germs (antibiotics). Follow these instructions at home:  Drink enough water and fluids to keep your urine clear or pale yellow.  Only take over-the-counter or prescription medicines as directed by your health care provider:  If  you are prescribed antibiotics, make sure you finish them even if you start to feel better.  Do not take aspirin.  Get lots of rest.  Gargle with 8 oz of salt water ( tsp of salt per 1 qt of water) as often as every 1-2 hours to soothe your throat.  Throat lozenges (if you are not at risk for choking) or sprays may be used to soothe your throat. Contact a health care provider if:  You have large, tender lumps in your neck.  You have a rash.  You cough up green, yellow-brown, or bloody spit. Get help right away if:  Your neck becomes stiff.  You drool or are unable to swallow liquids.  You vomit or are unable to keep medicines or liquids down.  You have severe pain that does not go away with the use of recommended medicines.  You have trouble breathing (not caused by a stuffy nose). This information is not intended to replace advice given to you by your health care provider. Make sure you discuss any questions you have with your health care provider. Document Released: 03/15/2005 Document Revised: 08/21/2015 Document Reviewed: 11/20/2012 Elsevier Interactive Patient Education  2017 [employer].

## 2016-03-03 NOTE — Progress Notes (Signed)
MEDICARE ANNUAL WELLNESS VISIT AND FU  Assessment:    abnormal glucose - CBC with Differential/Platelet - BASIC METABOLIC PANEL WITH GFR - Hepatic function panel - TSH - Lipid panel  Mixed hyperlipidemia - TSH - Lipid panel  Vitamin D deficiency - Vit D  25 hydroxy (rtn osteoporosis monitoring)   Medication management - Magnesium  TMJ (temporomandibular joint syndrome) information given to the patient, no gum/decrease hard foods, warm wet wash clothes, decrease stress, talk with dentist about possible night guard, can do massage, and exercise. Talk with dentist about mouth piece   CMT (Charcot-Marie-Tooth disease) Wants referral to different neuro, wants someone at [facility] - Ambulatory referral to Neurology  Pain in joint, multiple sites Follows pain management  Gastroesophageal reflux disease, esophagitis presence not specified Continue PPI/H2 blocker, diet discussed   Constipation, unspecified constipation type secondary to opoid, controlled with fiber/water, consider movantik  Asthma, chronic, mild intermittent, uncomplicated Controlled  Over 40 minutes of exam, counseling, chart review and critical decision making was performed Future Appointments Date Time Provider Benjamin  03/11/2016 9:00 AM Lubertha South, LCSW [facility] None  09/16/2016 9:00 AM Unk Pinto, MD [facility] None     Plan:   During the course of the visit the patient was educated and counseled about appropriate screening and preventive services including:    Pneumococcal vaccine   Prevnar 13  Influenza vaccine  Td vaccine  Screening electrocardiogram  Bone densitometry screening  Colorectal cancer screening  Diabetes screening  Glaucoma screening  Nutrition counseling   Advanced directives: requested   Subjective:  Brandi Erickson is a 45 y.o. female who presents for Medicare Annual Wellness Visit and follow up for predM, chol, HTN.  Her blood pressure has  been controlled at home, today their BP is BP: 124/66  She has chronic pain, follows with Dr. Andree Elk preferred pain. Has history of CMT, neck pain. 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  She has been on miralax and fiber, constipation is better but still has hemorrhoids, she will follow up with [place] surgery for removal.  She has had abnormal/heavy bleeding for 6-7 years, had [place] that showed 4 cm fibroid and enlarged uterus, negative endometrial biopsy, she has been seeing [facility] but they only want to do a hysterectomy and she just wants the myomectomy, we will refer her for a second opinion. Discussed that they still may suggest hysterectomy, she understands.  She states she has had sore throat x yesterday, some chills but no fever, some sinus drainage, no sick contacts.   She does not workout. She denies chest pain, shortness of breath, dizziness.  She is not on cholesterol medication and denies myalgias. Her cholesterol is at goal. The cholesterol last visit was:   Lab Results  Component Value Date   CHOL 183 08/26/2015   HDL 62 08/26/2015   LDLCALC 105 08/26/2015   TRIG 79 08/26/2015   CHOLHDL 3.0 08/26/2015    She has been working on diet and exercise for prediabetes, and denies polydipsia, polyuria and visual disturbances. Last A1C in the office was:  Lab Results  Component Value Date   HGBA1C 5.7 (H) 08/26/2015   Patient is on Vitamin D supplement.   Lab Results  Component Value Date   VD25OH 74 08/26/2015       Medication Review: Current Outpatient Prescriptions on File Prior to Visit  Medication Sig Dispense Refill  . albuterol (PROAIR HFA) 108 (90 BASE) MCG/ACT inhaler Inhale 2 puffs into the lungs every  6 (six) hours as needed for wheezing or shortness of breath.    . cetirizine (ZYRTEC) 10 MG tablet Take 10 mg by mouth every evening.    . fluticasone (CUTIVATE) 0.05 % cream Apply 1 application topically 2 (two) times daily.     . fluticasone furoate-vilanterol (BREO ELLIPTA)  200-25 MCG/INH AEPB Inhale 1 puff into the lungs daily. Use 1 inhalation daily 60 each 11  . Fluticasone-Salmeterol (ADVAIR DISKUS) 250-50 MCG/DOSE AEPB Inhale 1 puff into the lungs every 12 (twelve) hours.    . gabapentin (NEURONTIN) 800 MG tablet Take 1 tablet (800 mg total) by mouth 3 (three) times daily. 270 tablet 0  . Ginger 500 MG CAPS Take 1 capsule by mouth daily.    Marland Kitchen HYDROcodone-acetaminophen (NORCO) 7.5-325 MG tablet TAKE 1 TABLET BY MOUTH 4 TIMES A DAY AS NEEDED  0  . montelukast (SINGULAIR) 10 MG tablet Take 10 mg by mouth at bedtime.    Marland Kitchen NIFEdipine (PROCARDIA-XL/ADALAT-CC/NIFEDICAL-XL) 30 MG 24 hr tablet Take 1 tablet (30 mg total) by mouth daily. 90 tablet 1  . Olopatadine HCl (PATADAY) 0.2 % SOLN Apply to eye.    . ondansetron (ZOFRAN) 4 MG tablet Take 4 mg by mouth every 8 (eight) hours as needed for nausea or vomiting.    Marland Kitchen OVER THE COUNTER MEDICATION Fiber Choice 2 tabs daily    . tranexamic acid (LYSTEDA) 650 MG TABS tablet Take 650 mg by mouth 3 (three) times daily. Patient takes 2 tabs 2-3 times daily for menses pain.     No current facility-administered medications on file prior to visit.     Allergies  Allergen Reactions  . Linzess [Linaclotide] Rash  . Oxycontin [Oxycodone Hcl] Nausea And Vomiting  . Penicillins Hives  . Sulfa Antibiotics Hives  . Topamax [Topiramate] Hives    Current Problems (verified) Patient Active Problem List   Diagnosis Date Noted  . Elevated BP 05/07/2015  . Mixed hyperlipidemia 09/13/2014  . Prediabetes 09/13/2014  . Other fatigue 09/11/2014  . Vitamin D deficiency 09/11/2014  . Pain in joint, multiple sites 09/11/2014  . Medication management 09/11/2014  . GERD 08/14/2012  . Asthma, chronic 08/14/2012  . CMT (Charcot-Marie-Tooth disease) 08/14/2012  . Constipation 08/14/2012    Screening Tests Immunization History  Administered Date(s) Administered  . PPD Test 08/26/2015    Preventative care: Last colonoscopy:  N/A Last mammogram: 2017 Last pap smear/pelvic exam: 2017   DEXA:N/A  Prior vaccinations: TD or Tdap: DUE TDAP  Influenza: declines Pneumococcal: N/A Prevnar13: N/A Shingles/Zostavax: N/A  Names of Other Physician/Practitioners you currently use: 1. [facility] here for primary care 2. Dr. Katy Fitch, eye doctor, last visit 2017 3. Dr. Posey Pronto, dentist, last visit 01/2016 Patient Care Team: Unk Pinto, MD as PCP - General (Internal Medicine) Blanch Media, MD as Consulting Physician (Neurology) Renie Ora, MD as Consulting Physician (Anesthesiology) Wylene Simmer, MD as Consulting Physician (Orthopedic Surgery) Susa Day, MD as Consulting Physician (Orthopedic Surgery) Rogene Houston, MD as Consulting Physician (Gastroenterology)  SURGICAL HISTORY She  has a past surgical history that includes Esophageal manometry (05/17/2011); Tonsillectomy; tubal reconstruction (1997); Tubal ligation (1999); Dilation and curettage of uterus; Colonoscopy (N/A, 11/01/2012); Foot neuroma surgery (Right, 06-05-2013); Tonsillectomy (as child); Hysteroscopy w/D&C (N/A, 06/27/2013); and laparoscopy (N/A, 06/27/2013). FAMILY HISTORY Her family history is not on file. SOCIAL HISTORY She  reports that she has never smoked. She has never used smokeless tobacco. She reports that she does not drink alcohol or  use drugs.  MEDICARE WELLNESS OBJECTIVES: Physical activity: Current Exercise Habits: The patient does not participate in regular exercise at present, Exercise limited by: orthopedic condition(s) Cardiac risk factors: Cardiac Risk Factors include: dyslipidemia;hypertension;sedentary lifestyle Depression/mood screen:   Depression screen [facility] 2/9 03/03/2016  Decreased Interest 0  Down, Depressed, Hopeless 0  PHQ - 2 Score 0    ADLs:  In your present state of health, do you have any difficulty performing the following activities: 03/03/2016 08/26/2015  Hearing? N N   Vision? N N  Difficulty concentrating or making decisions? N N  Walking or climbing stairs? Y N  Dressing or bathing? N N  Doing errands, shopping? N N  Some recent data might be hidden     Cognitive Testing  Alert? Yes  Normal Appearance?Yes  Oriented to person? Yes  Place? Yes   Time? Yes  Recall of three objects?  Yes  Can perform simple calculations? Yes  Displays appropriate judgment?Yes  Can read the correct time from a watch face?Yes  EOL planning: Does Patient Have a Medical Advance Directive?: No Would patient like information on creating a medical advance directive?: Yes (MAU/Ambulatory/Procedural Areas - Information given)  Review of Systems  Constitutional: Positive for malaise/fatigue. Negative for chills, diaphoresis, fever and weight loss.  HENT: Positive for congestion. Negative for ear discharge, ear pain, hearing loss, nosebleeds, sore throat and tinnitus.   Eyes: Negative.   Respiratory: Negative for cough, hemoptysis, sputum production, shortness of breath, wheezing and stridor.   Cardiovascular: Negative for chest pain, palpitations, orthopnea, claudication and leg swelling.  Gastrointestinal: Positive for abdominal pain and constipation. Negative for blood in stool, diarrhea, heartburn, melena, nausea and vomiting.  Genitourinary: Negative.   Musculoskeletal: Positive for back pain, joint pain, myalgias and neck pain. Negative for falls.  Skin: Negative.  Negative for rash.  Neurological: Positive for headaches. Negative for dizziness, tingling, tremors, sensory change, speech change, focal weakness, seizures, loss of consciousness and weakness.  Psychiatric/Behavioral: Negative.  Negative for depression and memory loss. The patient does not have insomnia.      Objective:     Today's Vitals   03/03/16 1051  BP: 124/66  Resp: 16  Temp: 97.5 F (36.4 C)  Weight: 151 lb 3.2 oz (68.6 kg)  Height: 5' 5.5" (1.664 m)  PainSc: 5   PainLoc: Throat   Body  mass index is 24.78 kg/m.  General appearance: alert, no distress, WD/WN, female HEENT: normocephalic, sclerae anicteric, TMs pearly, nares patent, no discharge or erythema, pharynx normal Oral cavity: MMM, no lesions Neck: supple, no lymphadenopathy, no thyromegaly, no masses Heart: RRR, normal S1, S2, no murmurs Lungs: CTA bilaterally, no wheezes, rhonchi, or rales Abdomen: +bs, soft, non tender, non distended, no masses, no hepatomegaly, no splenomegaly Musculoskeletal: nontender, no swelling, no obvious deformity Extremities: no edema, no cyanosis, no clubbing Pulses: 2+ symmetric, upper and lower extremities, normal cap refill Neurological: alert, oriented x 3, CN2-12 intact, strength normal upper extremities and lower extremities, sensation normal throughout, DTRs 2+ throughout, no cerebellar signs, gait normal Psychiatric: normal affect, behavior normal, pleasant   Medicare Attestation I have personally reviewed: The patient's medical and social history Their use of alcohol, tobacco or illicit drugs Their current medications and supplements The patient's functional ability including ADLs,fall risks, home safety risks, cognitive, and hearing and visual impairment Diet and physical activities Evidence for depression or mood disorders  The patient's weight, height, BMI, and visual acuity have been recorded in the chart.  I have made  referrals, counseling, and provided education to the patient based on review of the above and I have provided the patient with a written personalized care plan for preventive services.     Vicie Mutters, PA-C   03/03/2016

## 2016-03-04 LAB — HEMOGLOBIN A1C
Hgb A1c MFr Bld: 5.5 % (ref <5.7)
Mean Plasma Glucose: 111 mg/dL

## 2016-03-09 ENCOUNTER — Telehealth: Payer: Self-pay

## 2016-03-09 ENCOUNTER — Other Ambulatory Visit: Payer: Self-pay | Admitting: Physician Assistant

## 2016-03-09 MED ORDER — PROMETHAZINE-DM 6.25-15 MG/5ML PO SYRP: 5.0000 mL | ORAL_SOLUTION | Freq: Four times a day (QID) | ORAL | 1 refills | Status: DC | PRN

## 2016-03-09 MED ORDER — AZITHROMYCIN 250 MG PO TABS: ORAL_TABLET | ORAL | 1 refills | Status: AC

## 2016-03-09 NOTE — Telephone Encounter (Signed)
Pt was informed of rx that ws sent to pt Zpak & cough meds.

## 2016-03-11 ENCOUNTER — Ambulatory Visit (INDEPENDENT_AMBULATORY_CARE_PROVIDER_SITE_OTHER): Payer: 59 | Admitting: Licensed Clinical Social Worker

## 2016-03-11 DIAGNOSIS — F39 Unspecified mood [affective] disorder: Secondary | ICD-10-CM | POA: Diagnosis not present
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
## 2016-03-29 HISTORY — PX: LAPAROSCOPIC TOTAL HYSTERECTOMY: SUR800

## 2016-03-31 ENCOUNTER — Other Ambulatory Visit: Payer: Self-pay | Admitting: Physician Assistant

## 2016-03-31 ENCOUNTER — Other Ambulatory Visit: Payer: Medicare Other

## 2016-03-31 DIAGNOSIS — R945 Abnormal results of liver function studies: Principal | ICD-10-CM

## 2016-03-31 DIAGNOSIS — R7989 Other specified abnormal findings of blood chemistry: Secondary | ICD-10-CM

## 2016-03-31 LAB — HEPATIC FUNCTION PANEL
ALT: 37 U/L — ABNORMAL HIGH (ref 6–29)
AST: 28 U/L (ref 10–35)
Albumin: 4.1 g/dL (ref 3.6–5.1)
Alkaline Phosphatase: 71 U/L (ref 33–115)
Bilirubin, Direct: 0.1 mg/dL (ref <=0.2)
Indirect Bilirubin: 0.4 mg/dL (ref 0.2–1.2)
Total Bilirubin: 0.5 mg/dL (ref 0.2–1.2)
Total Protein: 6.8 g/dL (ref 6.1–8.1)

## 2016-03-31 LAB — HEPATITIS PANEL, ACUTE
HCV Ab: NEGATIVE
Hep A IgM: NONREACTIVE
Hep B C IgM: NONREACTIVE
Hepatitis B Surface Ag: NEGATIVE

## 2016-04-01 ENCOUNTER — Ambulatory Visit: Payer: Self-pay | Admitting: Physician Assistant

## 2016-04-01 LAB — ANTI-DNA ANTIBODY, DOUBLE-STRANDED: ds DNA Ab: <1 IU/mL

## 2016-04-01 LAB — ANA: Anti Nuclear Antibody(ANA): NEGATIVE

## 2016-04-05 LAB — ANTI-SMOOTH MUSCLE ANTIBODY, IGG: Smooth Muscle Ab: <20 U (ref <20)

## 2016-04-05 NOTE — Progress Notes (Signed)
Pt aware of lab results & voiced understanding of those results. Pt states she did a cleanse & thinks this is why her labs were off.

## 2016-04-08 ENCOUNTER — Ambulatory Visit: Payer: Medicare Other | Admitting: Licensed Clinical Social Worker

## 2016-04-12 ENCOUNTER — Ambulatory Visit (INDEPENDENT_AMBULATORY_CARE_PROVIDER_SITE_OTHER): Payer: 59 | Admitting: Licensed Clinical Social Worker

## 2016-04-12 DIAGNOSIS — F39 Unspecified mood [affective] disorder: Secondary | ICD-10-CM
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
## 2016-05-03 ENCOUNTER — Other Ambulatory Visit: Payer: Self-pay | Admitting: Physician Assistant

## 2016-05-03 MED ORDER — SCOPOLAMINE 1 MG/3DAYS TD PT72: 1.0000 | MEDICATED_PATCH | TRANSDERMAL | 0 refills | Status: DC

## 2016-05-03 MED ORDER — MECLIZINE HCL 25 MG PO TABS
ORAL_TABLET | ORAL | 0 refills | Status: DC
Start: 2016-05-03 — End: 2016-09-16

## 2016-05-03 MED ORDER — GABAPENTIN 600 MG PO TABS: 600.0000 mg | ORAL_TABLET | Freq: Three times a day (TID) | ORAL | 0 refills | Status: DC

## 2016-05-04 ENCOUNTER — Other Ambulatory Visit: Payer: Self-pay | Admitting: Physician Assistant

## 2016-05-04 MED ORDER — GABAPENTIN ENACARBIL ER 600 MG PO TBCR: 600.0000 mg | EXTENDED_RELEASE_TABLET | Freq: Every morning | ORAL | 3 refills | Status: DC

## 2016-05-05 ENCOUNTER — Other Ambulatory Visit: Payer: Self-pay | Admitting: Internal Medicine

## 2016-05-13 ENCOUNTER — Ambulatory Visit: Payer: 59 | Admitting: Licensed Clinical Social Worker

## 2016-05-18 ENCOUNTER — Other Ambulatory Visit: Payer: Self-pay | Admitting: Physician Assistant

## 2016-05-18 DIAGNOSIS — E559 Vitamin D deficiency, unspecified: Secondary | ICD-10-CM

## 2016-06-22 ENCOUNTER — Ambulatory Visit (INDEPENDENT_AMBULATORY_CARE_PROVIDER_SITE_OTHER): Payer: 59 | Admitting: Licensed Clinical Social Worker

## 2016-06-22 DIAGNOSIS — F39 Unspecified mood [affective] disorder: Secondary | ICD-10-CM
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
## 2016-07-09 ENCOUNTER — Encounter (INDEPENDENT_AMBULATORY_CARE_PROVIDER_SITE_OTHER): Payer: Self-pay

## 2016-07-09 ENCOUNTER — Encounter (INDEPENDENT_AMBULATORY_CARE_PROVIDER_SITE_OTHER): Payer: Self-pay | Admitting: Internal Medicine

## 2016-07-16 ENCOUNTER — Ambulatory Visit: Payer: 59 | Admitting: Licensed Clinical Social Worker

## 2016-07-26 ENCOUNTER — Other Ambulatory Visit: Payer: Self-pay | Admitting: Physician Assistant

## 2016-07-26 MED ORDER — SCOPOLAMINE 1 MG/3DAYS TD PT72: 1.0000 | MEDICATED_PATCH | TRANSDERMAL | 0 refills | Status: DC

## 2016-07-27 ENCOUNTER — Encounter (INDEPENDENT_AMBULATORY_CARE_PROVIDER_SITE_OTHER): Payer: Self-pay | Admitting: Internal Medicine

## 2016-07-27 ENCOUNTER — Ambulatory Visit (INDEPENDENT_AMBULATORY_CARE_PROVIDER_SITE_OTHER): Payer: Medicare Other | Admitting: Internal Medicine

## 2016-07-27 VITALS — BP 116/70 | HR 80 | Temp 98.6°F | Ht 66.0 in | Wt 156.0 lb

## 2016-07-27 DIAGNOSIS — K5909 Other constipation: Secondary | ICD-10-CM

## 2016-07-27 NOTE — Patient Instructions (Signed)
OV in 1 year.  

## 2016-07-27 NOTE — Progress Notes (Addendum)
Subjective:    Patient ID: Brandi Erickson, female    DOB: 1971-01-13, 46 y.o.   MRN: [id number]  HPI Here today for f/u. Last seen one year ago for constipation. She takes Fiber for her constipation.  She tells me she still has some constipation. She is taking Fiber and Magnesium for constipation. She is having a BM daily and sometimes she has 2 BMs a day. No melena or BRRB. Appetite is good.  She has lost 2 pounds since her last visit.  She occasionally has acid reflux.     Hx of a bone disease ( Sharcot Marie Tooth Disease) and takes narcotics for this.  Review of Systems Past Medical History:  Diagnosis Date  . Allergy   . Asthma   . Chronic constipation   . CMT (Charcot-Marie-Tooth disease)   . GERD (gastroesophageal reflux disease)   . 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 Seasonal allergies   . Uterine polyp   . Wears glasses     Past Surgical History:  Procedure Laterality Date  . COLONOSCOPY N/A 11/01/2012   Procedure: COLONOSCOPY;  Surgeon: Rogene Houston, MD;  Location: [facility];  Service: Endoscopy;  Laterality: N/A;  130  . DILATION AND CURETTAGE OF UTERUS    . ESOPHAGEAL MANOMETRY  05/17/2011   Procedure: ESOPHAGEAL MANOMETRY (EM);  Surgeon: Beryle Beams, MD;  Location: [facility];  Service: Endoscopy;  Laterality: N/A;  . FOOT NEUROMA SURGERY Right 06-05-2013  . HYSTEROSCOPY W/D&C N/A 06/27/2013   Procedure: HYSTEROSCOPY POLPYECTOMY  ;  Surgeon: Governor Specking, MD;  Location: [facility];  Service: Gynecology;  Laterality: N/A;  . LAPAROSCOPY N/A 06/27/2013   Procedure: LAPAROSCOPY WITH extensive lysis of adhesions;  Surgeon: Governor Specking, MD;  Location: [facility];  Service: Gynecology;  Laterality: N/A;  . TONSILLECTOMY    . TONSILLECTOMY  as child  . TUBAL LIGATION  1999  . tubal reconstruction  1997    Allergies  Allergen Reactions  . Linzess  [Linaclotide] Rash  . Oxycontin [Oxycodone Hcl] Nausea And Vomiting  . Penicillins Hives  . Sulfa Antibiotics Hives  . Topamax [Topiramate] Hives    Current Outpatient Prescriptions on File Prior to Visit  Medication Sig Dispense Refill  . albuterol (PROAIR HFA) 108 (90 BASE) MCG/ACT inhaler Inhale 2 puffs into the lungs every 6 (six) hours as needed for wheezing or shortness of breath.    . cetirizine (ZYRTEC) 10 MG tablet Take 10 mg by mouth every evening.    . fluticasone (CUTIVATE) 0.05 % cream Apply 1 application topically 2 (two) times daily.     . Fluticasone-Salmeterol (ADVAIR DISKUS) 250-50 MCG/DOSE AEPB Inhale 1 puff into the lungs every 12 (twelve) hours.    . gabapentin (NEURONTIN) 600 MG tablet Take 1 tablet (600 mg total) by mouth 3 (three) times daily. 270 tablet 0  . Ginger 500 MG CAPS Take 1 capsule by mouth daily.    Marland Kitchen HYDROcodone-acetaminophen (NORCO) 7.5-325 MG tablet TAKE 1 TABLET BY MOUTH 4 TIMES A DAY AS NEEDED  0  . meclizine (ANTIVERT) 25 MG tablet 1/2-1 pill up to 3 times daily for motion sickness/dizziness 30 tablet 0  . methocarbamol (ROBAXIN) 500 MG tablet Take 1 tablet (500 mg total) by mouth 3 (three) times daily as needed for muscle spasms. 270 tablet 1  . montelukast (SINGULAIR) 10 MG tablet Take 10 mg by mouth at bedtime.    Marland Kitchen  Olopatadine HCl (PATADAY) 0.2 % SOLN Apply to eye.    Marland Kitchen omeprazole (PRILOSEC) 40 MG capsule Take 1 capsule (40 mg total) by mouth daily. 90 capsule 3  . scopolamine (TRANSDERM-SCOP, 1.5 MG,) 1 MG/3DAYS Place 1 patch (1.5 mg total) onto the skin every 3 (three) days. (Patient taking differently: Place 1 patch onto the skin as needed. ) 4 patch 0  . 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        Objective:   Physical Exam  Vitals:   07/27/16  1008  Weight: 156 lb (70.8 kg)  Height: 5\' 6"  (1.676 m)  Alert and oriented. Skin warm and dry. Oral mucosa is moist.   . Sclera anicteric, conjunctivae is pink. Thyroid not enlarged. No cervical lymphadenopathy. Lungs clear. Heart regular rate and rhythm.  Abdomen is soft. Bowel sounds are positive. No hepatomegaly. No abdominal masses felt. No tenderness.  No edema to lower extremities.          Assessment & Plan:  Constipation. Continue the fiber. Samples of Linzess given to patient (3 boxes) OV 1 year.

## 2016-07-29 ENCOUNTER — Ambulatory Visit (INDEPENDENT_AMBULATORY_CARE_PROVIDER_SITE_OTHER): Payer: 59 | Admitting: Licensed Clinical Social Worker

## 2016-07-29 DIAGNOSIS — F39 Unspecified mood [affective] disorder: Secondary | ICD-10-CM | POA: Diagnosis not present

## 2016-07-29 NOTE — Progress Notes (Signed)
   THERAPIST PROGRESS NOTE  Session Time: 45min  Participation Level: Active  Behavioral Response: NeatAlertIrritable  Type of Therapy: Individual Therapy  Treatment Goals addressed: Coping  Interventions: CBT, Motivational Interviewing, Solution Focused, Strength-based, Supportive and Reframing  Summary: Brandi Erickson is a 46 y.o. female who presents with continued symptoms of her diagnosis.  Patient reports that she has had a favorable mood.  Patient reports that she and her husband are getting along better and communicating well.  She denies feeling any negative feelings toward her relationship or her self.  Discussion of coping skills and how to continue to manage.   Suicidal/Homicidal: No  Therapist Response: LCSW provided Patient with ongoing emotional support and encouragement.  Normalized her feelings.  Commended Patient on her progress and reinforced the importance of client staying focused on her own strengths and resources and resiliency. Processed various strategies for dealing with stressors.    Plan: Return again in 2 weeks.  Diagnosis: Axis I: Mood Disorder    Axis II: No diagnosis    Lubertha South, LCSW 07/29/2016

## 2016-08-17 ENCOUNTER — Encounter: Payer: Self-pay | Admitting: Internal Medicine

## 2016-09-16 ENCOUNTER — Encounter: Payer: Self-pay | Admitting: Internal Medicine

## 2016-09-16 ENCOUNTER — Ambulatory Visit (INDEPENDENT_AMBULATORY_CARE_PROVIDER_SITE_OTHER): Payer: Medicare Other | Admitting: Internal Medicine

## 2016-09-16 VITALS — BP 104/68 | HR 68 | Temp 97.3°F | Resp 16 | Ht 65.0 in | Wt 152.8 lb

## 2016-09-16 DIAGNOSIS — E782 Mixed hyperlipidemia: Secondary | ICD-10-CM

## 2016-09-16 DIAGNOSIS — Z111 Encounter for screening for respiratory tuberculosis: Secondary | ICD-10-CM

## 2016-09-16 DIAGNOSIS — R0989 Other specified symptoms and signs involving the circulatory and respiratory systems: Secondary | ICD-10-CM

## 2016-09-16 DIAGNOSIS — Z136 Encounter for screening for cardiovascular disorders: Secondary | ICD-10-CM

## 2016-09-16 DIAGNOSIS — Z79899 Other long term (current) drug therapy: Secondary | ICD-10-CM

## 2016-09-16 DIAGNOSIS — Z0001 Encounter for general adult medical examination with abnormal findings: Secondary | ICD-10-CM | POA: Diagnosis not present

## 2016-09-16 DIAGNOSIS — R7303 Prediabetes: Secondary | ICD-10-CM

## 2016-09-16 DIAGNOSIS — E559 Vitamin D deficiency, unspecified: Secondary | ICD-10-CM

## 2016-09-16 DIAGNOSIS — Z1212 Encounter for screening for malignant neoplasm of rectum: Secondary | ICD-10-CM

## 2016-09-16 DIAGNOSIS — Z23 Encounter for immunization: Secondary | ICD-10-CM | POA: Diagnosis not present

## 2016-09-16 DIAGNOSIS — G600 Hereditary motor and sensory neuropathy: Secondary | ICD-10-CM

## 2016-09-16 DIAGNOSIS — R5383 Other fatigue: Secondary | ICD-10-CM | POA: Diagnosis not present

## 2016-09-16 DIAGNOSIS — K219 Gastro-esophageal reflux disease without esophagitis: Secondary | ICD-10-CM

## 2016-09-16 LAB — CBC WITH DIFFERENTIAL/PLATELET
Basophils Absolute: 43 {cells}/uL (ref 0–200)
Basophils Relative: 1 %
Eosinophils Absolute: 43 {cells}/uL (ref 15–500)
Eosinophils Relative: 1 %
HCT: 38.9 % (ref 35.0–45.0)
Hemoglobin: 12.4 g/dL (ref 11.7–15.5)
Lymphocytes Relative: 46 %
Lymphs Abs: 1978 {cells}/uL (ref 850–3900)
MCH: 25.5 pg — ABNORMAL LOW (ref 27.0–33.0)
MCHC: 31.9 g/dL — ABNORMAL LOW (ref 32.0–36.0)
MCV: 79.9 fL — ABNORMAL LOW (ref 80.0–100.0)
MPV: 9.3 fL (ref 7.5–12.5)
Monocytes Absolute: 516 {cells}/uL (ref 200–950)
Monocytes Relative: 12 %
Neutro Abs: 1720 {cells}/uL (ref 1500–7800)
Neutrophils Relative %: 40 %
Platelets: 352 K/uL (ref 140–400)
RBC: 4.87 MIL/uL (ref 3.80–5.10)
RDW: 15.2 % — ABNORMAL HIGH (ref 11.0–15.0)
WBC: 4.3 K/uL (ref 3.8–10.8)

## 2016-09-16 LAB — TSH: TSH: 1.92 mIU/L

## 2016-09-16 NOTE — Progress Notes (Signed)
[facility] Unk Pinto, M.D.      Uvaldo Bristle. Silverio Lay, P.A.-C [facility]                [address] [place], [place] [address] Telephone [phone] Telefax [phone]  Annual Screening/Preventative Visit & Comprehensive Evaluation &  Examination     This very nice 46 y.o. MBF presents for a Screening/Preventative Visit & comprehensive evaluation and management of multiple medical co-morbidities.  Patient has been followed for HTN, Prediabetes, Hyperlipidemia and Vitamin D Deficiency. ! Month ago she had #8 hmmertoe repairs by Dr Doran Durand. She is schedule for "parial" hysterectomy (for painful uterine fibroids and dysfunctional menstrual bleeding) on July 31 at [facility].     Patient was dx'd with Charcot Massie Maroon Dz  in 2000 (age 61)  and is followed at [facility] and is on SS Disability for chronic pain. She also describes pains in her hands and fingers. \      Patient is followed expectantly with a dx/o labile HTN. Patient's BP has been controlled at home and patient denies any cardiac symptoms as chest pain, palpitations, shortness of breath, dizziness or ankle swelling. Today's BP: 104/68      Patient's hyperlipidemia is controlled with diet and medications. Patient denies myalgias or other medication SE's. Last lipids were not at goal: Lab Results  Component Value Date   CHOL 203 (H) 03/03/2016   HDL 67 03/03/2016   LDLCALC 124 (H) 03/03/2016   TRIG 62 03/03/2016   CHOLHDL 3.0 03/03/2016      Patient has prediabetes predating (A1c 5.7% in June 2016 and 5.6% in Feb 2017) and patient denies reactive hypoglycemic symptoms, visual blurring, diabetic polys, or paresthesias. Last A1c was at goal: Lab Results  Component Value Date   HGBA1C 5.5 03/03/2016      Finally, patient has history of Vitamin D Deficiency ("25" in June 2016) and last Vitamin D was at goal: Lab Results  Component  Value Date   VD25OH 74 08/26/2015   Current Outpatient Prescriptions on File Prior to Visit  Medication Sig  . albuterol (PROAIR HFA) 108 (90 BASE) MCG/ACT inhaler Inhale 2 puffs into the lungs every 6 (six) hours as needed for wheezing or shortness of breath.  . cetirizine (ZYRTEC) 10 MG tablet Take 10 mg by mouth every evening.  . fluticasone (CUTIVATE) 0.05 % cream Apply 1 application topically 2 (two) times daily.   . Fluticasone-Salmeterol (ADVAIR DISKUS) 250-50 MCG/DOSE AEPB Inhale 1 puff into the lungs every 12 (twelve) hours.  . gabapentin (NEURONTIN) 600 MG tablet Take 1 tablet (600 mg total) by mouth 3 (three) times daily.  . Ginger 500 MG CAPS Take 1 capsule by mouth daily.  Marland Kitchen HYDROcodone-acetaminophen (NORCO) 7.5-325 MG tablet TAKE 1 TABLET BY MOUTH 4 TIMES A DAY AS NEEDED  . methocarbamol (ROBAXIN) 500 MG tablet Take 1 tablet (500 mg total) by mouth 3 (three) times daily as needed for muscle spasms.  . montelukast (SINGULAIR) 10 MG tablet Take 10 mg by mouth at bedtime.  . Olopatadine HCl (PATADAY) 0.2 % SOLN Apply to eye.  Marland Kitchen omeprazole (PRILOSEC) 40 MG capsule Take 1 capsule (40 mg total) by mouth daily.  . 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    Allergies  Allergen Reactions  . Linzess [Linaclotide] Rash  . Oxycontin [Oxycodone Hcl] Nausea And Vomiting  . Penicillins Hives  . Sulfa Antibiotics Hives  . Topamax [Topiramate] Hives   Past Medical History:  Diagnosis Date  . Allergy   . Asthma   . Chronic constipation   . CMT (Charcot-Marie-Tooth disease)   . GERD (gastroesophageal reflux disease)   . 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 Seasonal allergies   . Uterine polyp   . Wears glasses    Health Maintenance  Topic  Date Due  . HIV Screening  06/18/1984  . TETANUS/TDAP  06/18/1988  . PAP SMEAR  09/13/2016  . INFLUENZA VACCINE  01/20/2017 (Originally 10/27/2016)   Immunization History  Administered Date(s) Administered  . PPD Test 08/26/2015, 09/16/2016  . Tdap 09/16/2016   Past Surgical History:  Procedure Laterality Date  . COLONOSCOPY N/A 11/01/2012   Procedure: COLONOSCOPY;  Surgeon: Rogene Houston, MD;  Location: [facility];  Service: Endoscopy;  Laterality: N/A;  130  . DILATION AND CURETTAGE OF UTERUS    . ESOPHAGEAL MANOMETRY  05/17/2011   Procedure: ESOPHAGEAL MANOMETRY (EM);  Surgeon: Beryle Beams, MD;  Location: [facility];  Service: Endoscopy;  Laterality: N/A;  . FOOT NEUROMA SURGERY Right 06-05-2013  . HYSTEROSCOPY W/D&C N/A 06/27/2013   Procedure: HYSTEROSCOPY POLPYECTOMY  ;  Surgeon: Governor Specking, MD;  Location: [facility];  Service: Gynecology;  Laterality: N/A;  . LAPAROSCOPY N/A 06/27/2013   Procedure: LAPAROSCOPY WITH extensive lysis of adhesions;  Surgeon: Governor Specking, MD;  Location: [place];  Service: Gynecology;  Laterality: N/A;  . TONSILLECTOMY    . TONSILLECTOMY  as child  . TUBAL LIGATION  1999  . tubal reconstruction  1997   Social History  Substance Use Topics  . Smoking status: Never Smoker  . Smokeless tobacco: Never Used  . Alcohol use No    ROS Constitutional: Denies fever, chills, weight loss/gain, headaches, insomnia,  night sweats, and change in appetite. Does c/o fatigue. Eyes: Denies redness, blurred vision, diplopia, discharge, itchy, watery eyes.  ENT: Denies discharge, congestion, post nasal drip, epistaxis, sore throat, earache, hearing loss, dental pain, Tinnitus, Vertigo, Sinus pain, snoring.  Cardio: Denies chest pain, palpitations, irregular heartbeat, syncope, dyspnea, diaphoresis, orthopnea, PND, claudication, edema Respiratory: denies cough, dyspnea, DOE, pleurisy, hoarseness, laryngitis, wheezing.   Gastrointestinal: Denies dysphagia, heartburn, reflux, water brash, pain, cramps, nausea, vomiting, bloating, diarrhea, constipation, hematemesis, melena, hematochezia, jaundice, hemorrhoids Genitourinary: Denies dysuria, frequency, urgency, nocturia, hesitancy, discharge, hematuria, flank pain Breast: Breast lumps, nipple discharge, bleeding.  Musculoskeletal: Denies arthralgia, myalgia, stiffness, Jt. Swelling, pain, limp, and strain/sprain. Denies falls. Skin: Denies puritis, rash, hives, warts, acne, eczema, changing in skin lesion Neuro: No weakness, tremor, incoordination, spasms, paresthesia, pain Psychiatric: Denies confusion, memory loss, sensory loss. Denies Depression. Endocrine: Denies change in weight, skin, hair change, nocturia, and paresthesia, diabetic polys, visual blurring, hyper / hypo glycemic episodes.  Heme/Lymph: No excessive bleeding, bruising, enlarged lymph nodes.  Physical Exam  BP 104/68   Pulse 68   Temp 97.3 F (36.3 C)   Resp 16   Ht 5\' 5"  (1.651 m)   Wt 152 lb 12.8 oz (69.3 kg)   BMI 25.43 kg/m   General Appearance: Well nourished, well groomed and in no apparent distress.  Eyes: PERRLA, EOMs, conjunctiva no swelling  or erythema, normal fundi and vessels. Sinuses: No frontal/maxillary tenderness ENT/Mouth: EACs patent / TMs  nl. Nares clear without erythema, swelling, mucoid exudates. Oral hygiene is good. No erythema, swelling, or exudate. Tongue normal, non-obstructing. Tonsils not swollen or erythematous. Hearing normal.  Neck: Supple, thyroid normal. No bruits, nodes or JVD. Respiratory: Respiratory effort normal.  BS equal and clear bilateral without rales, rhonci, wheezing or stridor. Cardio: Heart sounds are normal with regular rate and rhythm and no murmurs, rubs or gallops. Peripheral pulses are normal and equal bilaterally without edema. No aortic or femoral bruits. Chest: symmetric with normal excursions and percussion. Abdomen: Flat, soft  with bowel sounds active. Nontender, no guarding, rebound, hernias, masses, or organomegaly.  Lymphatics: Non tender without lymphadenopathy.  Musculoskeletal: Full ROM all peripheral extremities, joint stability, 5/5 strength, and normal gait. Skin: Warm and dry without rashes, lesions, cyanosis, clubbing or  ecchymosis.  Neuro: Cranial nerves intact, reflexes equal bilaterally. Normal muscle tone, no cerebellar symptoms. Sensation intact.  Pysch: Alert and oriented X 3, normal affect, Insight and Judgment appropriate.   Assessment and Plan  1. Annual Preventative Screening Examination  2. Labile hypertension  - EKG 12-Lead - Urinalysis, Routine w reflex microscopic - Microalbumin / creatinine urine ratio - CBC with Differential/Platelet - BASIC METABOLIC PANEL WITH GFR - Magnesium - TSH  3. Hyperlipidemia, mixed  - EKG 12-Lead - Hepatic function panel - Lipid panel - TSH  4. Prediabetes  - EKG 12-Lead - Hemoglobin A1c - Insulin, random  5. Vitamin D deficiency  - VITAMIN D 25 Hydroxy   6. Gastroesophageal reflux disease    7. CMT (Charcot-Marie-Tooth disease)   8. Screening for rectal cancer  - POC Hemoccult Bld/Stl  9. Screening for ischemic heart disease  - EKG 12-Lead  10. Fatigue, unspecified type  - Iron and TIBC - Vitamin B12 - CBC with Differential/Platelet - TSH  11. Medication management  - Urinalysis, Routine w reflex microscopic - Microalbumin / creatinine urine ratio - CBC with Differential/Platelet - BASIC METABOLIC PANEL WITH GFR - Hepatic function panel - Magnesium - Lipid panel - TSH - Hemoglobin A1c - Insulin, random - VITAMIN D 25 Hydroxy   12. Screening examination for pulmonary tuberculosis  - PPD  13. Need for prophylactic vaccination with combined diphtheria-tetanus-pertussis (DTP) vaccine  - Tdap vaccine greater than or equal to 7yo IM        Patient was counseled in prudent diet to achieve/maintain BMI less  than 25 for weight control, BP monitoring, regular exercise and medications. Discussed med's effects and SE's. Screening labs and tests as requested with regular follow-up as recommended. Over 40 minutes of exam, counseling, chart review and high complex critical decision making was performed.

## 2016-09-16 NOTE — Patient Instructions (Signed)

## 2016-09-17 LAB — MICROALBUMIN / CREATININE URINE RATIO
Creatinine, Urine: 420 mg/dL — ABNORMAL HIGH (ref 20–320)
Microalb Creat Ratio: 3 mcg/mg creat (ref <30)
Microalb, Ur: 1.1 mg/dL

## 2016-09-17 LAB — BASIC METABOLIC PANEL WITH GFR
BUN: 10 mg/dL (ref 7–25)
CO2: 24 mmol/L (ref 20–31)
Calcium: 9.5 mg/dL (ref 8.6–10.2)
Chloride: 100 mmol/L (ref 98–110)
Creat: 0.82 mg/dL (ref 0.50–1.10)
GFR, Est African American: >89 mL/min (ref >=60)
GFR, Est Non African American: 85 mL/min (ref >=60)
Glucose, Bld: 78 mg/dL (ref 65–99)
Potassium: 3.8 mmol/L (ref 3.5–5.3)
Sodium: 137 mmol/L (ref 135–146)

## 2016-09-17 LAB — URINALYSIS, MICROSCOPIC ONLY
Bacteria, UA: NONE SEEN [HPF]
Casts: NONE SEEN [LPF]
Crystals: NONE SEEN [HPF]
RBC / HPF: NONE SEEN RBC/HPF (ref <=2)
WBC, UA: NONE SEEN WBC/HPF (ref <=5)
Yeast: NONE SEEN [HPF]

## 2016-09-17 LAB — HEPATIC FUNCTION PANEL
ALT: 12 U/L (ref 6–29)
AST: 20 U/L (ref 10–35)
Albumin: 4.1 g/dL (ref 3.6–5.1)
Alkaline Phosphatase: 80 U/L (ref 33–115)
Bilirubin, Direct: 0.1 mg/dL (ref <=0.2)
Indirect Bilirubin: 0.4 mg/dL (ref 0.2–1.2)
Total Bilirubin: 0.5 mg/dL (ref 0.2–1.2)
Total Protein: 7 g/dL (ref 6.1–8.1)

## 2016-09-17 LAB — URINALYSIS, ROUTINE W REFLEX MICROSCOPIC
Bilirubin Urine: NEGATIVE
Glucose, UA: NEGATIVE
Hgb urine dipstick: NEGATIVE
Ketones, ur: NEGATIVE
Leukocytes, UA: NEGATIVE
Nitrite: NEGATIVE
Protein, ur: NEGATIVE
Specific Gravity, Urine: 1.027 (ref 1.001–1.035)
pH: 5.5 (ref 5.0–8.0)

## 2016-09-17 LAB — IRON AND TIBC
%SAT: 18 % (ref 11–50)
Iron: 75 ug/dL (ref 40–190)
TIBC: 406 ug/dL (ref 250–450)
UIBC: 331 ug/dL

## 2016-09-17 LAB — INSULIN, RANDOM: Insulin: 3.9 u[IU]/mL (ref 2.0–19.6)

## 2016-09-17 LAB — HEMOGLOBIN A1C
Hgb A1c MFr Bld: 5.5 % (ref <5.7)
Mean Plasma Glucose: 111 mg/dL

## 2016-09-17 LAB — VITAMIN B12: Vitamin B-12: 446 pg/mL (ref 200–1100)

## 2016-09-17 LAB — MAGNESIUM: Magnesium: 2 mg/dL (ref 1.5–2.5)

## 2016-09-17 LAB — LIPID PANEL
Cholesterol: 175 mg/dL (ref <200)
HDL: 54 mg/dL (ref >50)
LDL Cholesterol: 105 mg/dL — ABNORMAL HIGH (ref <100)
Total CHOL/HDL Ratio: 3.2 Ratio (ref <5.0)
Triglycerides: 81 mg/dL (ref <150)
VLDL: 16 mg/dL (ref <30)

## 2016-09-17 LAB — VITAMIN D 25 HYDROXY (VIT D DEFICIENCY, FRACTURES): Vit D, 25-Hydroxy: 113 ng/mL — ABNORMAL HIGH (ref 30–100)

## 2016-09-20 ENCOUNTER — Telehealth: Payer: Self-pay | Admitting: Licensed Clinical Social Worker

## 2016-09-20 LAB — TB SKIN TEST
Induration: 0 mm
TB Skin Test: NEGATIVE

## 2016-09-20 NOTE — Telephone Encounter (Signed)
I called her at 143pm; she asked me to call her bakc later today.

## 2016-09-21 ENCOUNTER — Ambulatory Visit (INDEPENDENT_AMBULATORY_CARE_PROVIDER_SITE_OTHER): Payer: 59 | Admitting: Licensed Clinical Social Worker

## 2016-09-21 DIAGNOSIS — F39 Unspecified mood [affective] disorder: Secondary | ICD-10-CM
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
## 2016-10-05 ENCOUNTER — Ambulatory Visit: Payer: 59 | Admitting: Licensed Clinical Social Worker

## 2016-10-14 ENCOUNTER — Ambulatory Visit (INDEPENDENT_AMBULATORY_CARE_PROVIDER_SITE_OTHER): Payer: 59 | Admitting: Licensed Clinical Social Worker

## 2016-10-14 DIAGNOSIS — F39 Unspecified mood [affective] disorder: Secondary | ICD-10-CM

## 2016-10-26 ENCOUNTER — Ambulatory Visit: Payer: 59 | Admitting: Licensed Clinical Social Worker

## 2016-10-28 ENCOUNTER — Encounter: Payer: Self-pay | Admitting: Internal Medicine

## 2016-11-03 NOTE — Progress Notes (Addendum)
   THERAPIST PROGRESS NOTE  Session Time: 77min  Participation Level: Active  Behavioral Response: NeatAlertIrritable  Type of Therapy: Individual Therapy  Treatment Goals addressed: Coping  Interventions: CBT, Motivational Interviewing, Solution Focused, Strength-based, Supportive and Reframing  Summary: Brandi Erickson is a 46 y.o. female who presents with continued symptoms of her diagnosis.  Patient vented her frustration with concerns to her current legal concerns. Brandi Erickson reports that she has to go to court soon due to her husband's 50B on her.  She reports that he wants a divorce.  Factors that contribute to client's ongoing depressive symptoms were discussed and include real and perceived feelings of isolation, criticism, rejection, shame and guilt.   Suicidal/Homicidal: No  Therapist Response: LCSW provided Patient with ongoing emotional support and encouragement.  Normalized her feelings.  Commended Patient on her progress and reinforced the importance of client staying focused on her own strengths and resources and resiliency. Processed various strategies for dealing with stressors.    Plan: Return again in 2 weeks.  Diagnosis: Axis I: Mood Disorder    Axis II: No diagnosis    Lubertha South, LCSW 10/14/2016

## 2016-12-16 ENCOUNTER — Ambulatory Visit: Payer: Medicare Other | Admitting: Licensed Clinical Social Worker

## 2016-12-30 ENCOUNTER — Ambulatory Visit: Payer: Self-pay | Admitting: Licensed Clinical Social Worker

## 2017-01-10 ENCOUNTER — Ambulatory Visit (INDEPENDENT_AMBULATORY_CARE_PROVIDER_SITE_OTHER): Payer: Medicare Other | Admitting: Licensed Clinical Social Worker

## 2017-01-10 DIAGNOSIS — F39 Unspecified mood [affective] disorder: Secondary | ICD-10-CM
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
## 2017-01-24 ENCOUNTER — Ambulatory Visit (INDEPENDENT_AMBULATORY_CARE_PROVIDER_SITE_OTHER): Payer: Medicare Other | Admitting: Licensed Clinical Social Worker

## 2017-01-24 DIAGNOSIS — F39 Unspecified mood [affective] disorder: Secondary | ICD-10-CM

## 2017-01-24 NOTE — Progress Notes (Signed)
   THERAPIST PROGRESS NOTE  Session Time: 81min  Participation Level: Active  Behavioral Response: NeatAlertDepressed  Type of Therapy: Individual Therapy  Treatment Goals addressed: Coping  Interventions: CBT, Motivational Interviewing, Solution Focused, Strength-based, Supportive and Reframing  Summary: Brandi Erickson is a 46 y.o. female who presents with continued symptoms of her diagnosis.  Patient reports a favorable mood.  Patient reports that she has talked to her previous neighbor about her estranged husband. Patient reports that she will attempt to obtain his social security or his VA benefit to assist her financially.  Patient was able to examine the relationship from the beginning to the end.  Patient reports that she left him after about a month of being married for a month.  She reports that they did not resolve any issues during the brief seperation.  Patient reports that she had difficulties dealing with her in-laws.  Explored with Patient her financial needs and future relationships.   Suicidal/Homicidal: No  Therapist Response: LCSW provided Patient with ongoing emotional support and encouragement.  Normalized her feelings.  Commended Patient on her progress and reinforced the importance of client staying focused on her own strengths and resources and resiliency. Processed various strategies for dealing with stressors.    Plan: Return again in 2 weeks.  Diagnosis: Axis I: Mood Disorder    Axis II: No diagnosis    Lubertha South, LCSW 01/24/2017

## 2017-02-24 ENCOUNTER — Ambulatory Visit (INDEPENDENT_AMBULATORY_CARE_PROVIDER_SITE_OTHER): Payer: Medicare Other | Admitting: Licensed Clinical Social Worker

## 2017-02-24 DIAGNOSIS — F39 Unspecified mood [affective] disorder: Secondary | ICD-10-CM
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
## 2017-03-03 DIAGNOSIS — J3089 Other allergic rhinitis: Secondary | ICD-10-CM | POA: Diagnosis not present

## 2017-03-03 DIAGNOSIS — J3081 Allergic rhinitis due to animal (cat) (dog) hair and dander: Secondary | ICD-10-CM | POA: Diagnosis not present

## 2017-03-03 DIAGNOSIS — J301 Allergic rhinitis due to pollen: Secondary | ICD-10-CM | POA: Diagnosis not present

## 2017-03-07 ENCOUNTER — Ambulatory Visit: Payer: Self-pay | Admitting: Physician Assistant

## 2017-03-15 DIAGNOSIS — J3081 Allergic rhinitis due to animal (cat) (dog) hair and dander: Secondary | ICD-10-CM | POA: Diagnosis not present

## 2017-03-15 DIAGNOSIS — J301 Allergic rhinitis due to pollen: Secondary | ICD-10-CM | POA: Diagnosis not present

## 2017-03-15 DIAGNOSIS — J3089 Other allergic rhinitis: Secondary | ICD-10-CM | POA: Diagnosis not present

## 2017-03-24 ENCOUNTER — Ambulatory Visit: Payer: Medicare Other | Admitting: Licensed Clinical Social Worker

## 2017-03-24 ENCOUNTER — Ambulatory Visit: Payer: Self-pay | Admitting: Physician Assistant

## 2017-03-24 DIAGNOSIS — M549 Dorsalgia, unspecified: Secondary | ICD-10-CM | POA: Diagnosis not present

## 2017-03-24 DIAGNOSIS — M542 Cervicalgia: Secondary | ICD-10-CM | POA: Diagnosis not present

## 2017-03-24 DIAGNOSIS — Z79899 Other long term (current) drug therapy: Secondary | ICD-10-CM | POA: Diagnosis not present

## 2017-03-24 DIAGNOSIS — J3081 Allergic rhinitis due to animal (cat) (dog) hair and dander: Secondary | ICD-10-CM | POA: Diagnosis not present

## 2017-03-24 DIAGNOSIS — J301 Allergic rhinitis due to pollen: Secondary | ICD-10-CM | POA: Diagnosis not present

## 2017-03-24 DIAGNOSIS — Z79891 Long term (current) use of opiate analgesic: Secondary | ICD-10-CM | POA: Diagnosis not present

## 2017-03-24 DIAGNOSIS — J3089 Other allergic rhinitis: Secondary | ICD-10-CM | POA: Diagnosis not present

## 2017-03-24 DIAGNOSIS — G894 Chronic pain syndrome: Secondary | ICD-10-CM | POA: Diagnosis not present

## 2017-03-30 DIAGNOSIS — J301 Allergic rhinitis due to pollen: Secondary | ICD-10-CM | POA: Diagnosis not present

## 2017-03-30 DIAGNOSIS — J3089 Other allergic rhinitis: Secondary | ICD-10-CM | POA: Diagnosis not present

## 2017-03-30 DIAGNOSIS — J3081 Allergic rhinitis due to animal (cat) (dog) hair and dander: Secondary | ICD-10-CM | POA: Diagnosis not present

## 2017-04-05 DIAGNOSIS — J3089 Other allergic rhinitis: Secondary | ICD-10-CM | POA: Diagnosis not present

## 2017-04-05 DIAGNOSIS — J3081 Allergic rhinitis due to animal (cat) (dog) hair and dander: Secondary | ICD-10-CM | POA: Diagnosis not present

## 2017-04-05 DIAGNOSIS — J301 Allergic rhinitis due to pollen: Secondary | ICD-10-CM | POA: Diagnosis not present

## 2017-04-14 DIAGNOSIS — J301 Allergic rhinitis due to pollen: Secondary | ICD-10-CM | POA: Diagnosis not present

## 2017-04-14 DIAGNOSIS — J3081 Allergic rhinitis due to animal (cat) (dog) hair and dander: Secondary | ICD-10-CM | POA: Diagnosis not present

## 2017-04-14 DIAGNOSIS — J3089 Other allergic rhinitis: Secondary | ICD-10-CM | POA: Diagnosis not present

## 2017-04-15 NOTE — Progress Notes (Signed)
CANCELED

## 2017-04-19 ENCOUNTER — Encounter: Payer: Self-pay | Admitting: Physician Assistant

## 2017-04-19 ENCOUNTER — Ambulatory Visit (INDEPENDENT_AMBULATORY_CARE_PROVIDER_SITE_OTHER): Payer: Medicare Other | Admitting: Physician Assistant

## 2017-04-19 VITALS — Ht 65.0 in

## 2017-04-19 DIAGNOSIS — J3089 Other allergic rhinitis: Secondary | ICD-10-CM | POA: Diagnosis not present

## 2017-04-19 DIAGNOSIS — J3081 Allergic rhinitis due to animal (cat) (dog) hair and dander: Secondary | ICD-10-CM | POA: Diagnosis not present

## 2017-04-19 DIAGNOSIS — J301 Allergic rhinitis due to pollen: Secondary | ICD-10-CM | POA: Diagnosis not present

## 2017-04-19 DIAGNOSIS — R0989 Other specified symptoms and signs involving the circulatory and respiratory systems: Secondary | ICD-10-CM

## 2017-04-22 DIAGNOSIS — M542 Cervicalgia: Secondary | ICD-10-CM | POA: Diagnosis not present

## 2017-04-22 DIAGNOSIS — M549 Dorsalgia, unspecified: Secondary | ICD-10-CM | POA: Diagnosis not present

## 2017-04-22 DIAGNOSIS — G894 Chronic pain syndrome: Secondary | ICD-10-CM | POA: Diagnosis not present

## 2017-05-04 DIAGNOSIS — J3081 Allergic rhinitis due to animal (cat) (dog) hair and dander: Secondary | ICD-10-CM | POA: Diagnosis not present

## 2017-05-04 DIAGNOSIS — J3089 Other allergic rhinitis: Secondary | ICD-10-CM | POA: Diagnosis not present

## 2017-05-04 DIAGNOSIS — J301 Allergic rhinitis due to pollen: Secondary | ICD-10-CM | POA: Diagnosis not present

## 2017-05-12 DIAGNOSIS — J3089 Other allergic rhinitis: Secondary | ICD-10-CM | POA: Diagnosis not present

## 2017-05-12 DIAGNOSIS — J3081 Allergic rhinitis due to animal (cat) (dog) hair and dander: Secondary | ICD-10-CM | POA: Diagnosis not present

## 2017-05-12 DIAGNOSIS — J301 Allergic rhinitis due to pollen: Secondary | ICD-10-CM | POA: Diagnosis not present

## 2017-05-20 DIAGNOSIS — G629 Polyneuropathy, unspecified: Secondary | ICD-10-CM | POA: Diagnosis not present

## 2017-05-20 DIAGNOSIS — J301 Allergic rhinitis due to pollen: Secondary | ICD-10-CM | POA: Diagnosis not present

## 2017-05-20 DIAGNOSIS — J3081 Allergic rhinitis due to animal (cat) (dog) hair and dander: Secondary | ICD-10-CM | POA: Diagnosis not present

## 2017-05-20 DIAGNOSIS — M542 Cervicalgia: Secondary | ICD-10-CM | POA: Diagnosis not present

## 2017-05-20 DIAGNOSIS — Z79899 Other long term (current) drug therapy: Secondary | ICD-10-CM | POA: Diagnosis not present

## 2017-05-20 DIAGNOSIS — Z79891 Long term (current) use of opiate analgesic: Secondary | ICD-10-CM | POA: Diagnosis not present

## 2017-05-20 DIAGNOSIS — G894 Chronic pain syndrome: Secondary | ICD-10-CM | POA: Diagnosis not present

## 2017-05-20 DIAGNOSIS — J3089 Other allergic rhinitis: Secondary | ICD-10-CM | POA: Diagnosis not present

## 2017-05-25 ENCOUNTER — Ambulatory Visit (INDEPENDENT_AMBULATORY_CARE_PROVIDER_SITE_OTHER): Payer: Medicare Other | Admitting: Internal Medicine

## 2017-05-25 VITALS — BP 106/72 | HR 60 | Temp 97.7°F | Resp 16 | Ht 64.75 in | Wt 132.6 lb

## 2017-05-25 DIAGNOSIS — R7303 Prediabetes: Secondary | ICD-10-CM

## 2017-05-25 DIAGNOSIS — K219 Gastro-esophageal reflux disease without esophagitis: Secondary | ICD-10-CM

## 2017-05-25 DIAGNOSIS — E782 Mixed hyperlipidemia: Secondary | ICD-10-CM | POA: Diagnosis not present

## 2017-05-25 DIAGNOSIS — D519 Vitamin B12 deficiency anemia, unspecified: Secondary | ICD-10-CM

## 2017-05-25 DIAGNOSIS — R5383 Other fatigue: Secondary | ICD-10-CM

## 2017-05-25 DIAGNOSIS — Z79899 Other long term (current) drug therapy: Secondary | ICD-10-CM | POA: Diagnosis not present

## 2017-05-25 DIAGNOSIS — Z0001 Encounter for general adult medical examination with abnormal findings: Secondary | ICD-10-CM

## 2017-05-25 DIAGNOSIS — D649 Anemia, unspecified: Secondary | ICD-10-CM | POA: Diagnosis not present

## 2017-05-25 DIAGNOSIS — I10 Essential (primary) hypertension: Secondary | ICD-10-CM

## 2017-05-25 DIAGNOSIS — Z1211 Encounter for screening for malignant neoplasm of colon: Secondary | ICD-10-CM

## 2017-05-25 DIAGNOSIS — R0989 Other specified symptoms and signs involving the circulatory and respiratory systems: Secondary | ICD-10-CM | POA: Diagnosis not present

## 2017-05-25 DIAGNOSIS — Z136 Encounter for screening for cardiovascular disorders: Secondary | ICD-10-CM

## 2017-05-25 DIAGNOSIS — E559 Vitamin D deficiency, unspecified: Secondary | ICD-10-CM

## 2017-05-25 DIAGNOSIS — G600 Hereditary motor and sensory neuropathy: Secondary | ICD-10-CM

## 2017-05-25 DIAGNOSIS — Z0000 Encounter for general adult medical examination without abnormal findings: Secondary | ICD-10-CM | POA: Diagnosis not present

## 2017-05-25 DIAGNOSIS — Z1212 Encounter for screening for malignant neoplasm of rectum: Secondary | ICD-10-CM

## 2017-05-25 DIAGNOSIS — R7309 Other abnormal glucose: Secondary | ICD-10-CM

## 2017-05-25 DIAGNOSIS — D509 Iron deficiency anemia, unspecified: Secondary | ICD-10-CM

## 2017-05-25 NOTE — Patient Instructions (Signed)

## 2017-05-25 NOTE — Progress Notes (Signed)
[facility] Unk Pinto, M.D.     Uvaldo Bristle. Silverio Lay, P.A.-C Liane Comber, [place], [place] [address] Telephone [phone] Telefax [phone] Annual Screening/Preventative Visit & Comprehensive Evaluation &  Examination     This very nice 47 y.o. Divorcing BF  presents for a Screening/Preventative Visit & comprehensive evaluation and management of multiple medical co-morbidities.  Patient has been followed for HTN, HLD, Prediabetes  and Vitamin D Deficiency.            Patient is on SS Disability for Chronic Pain Syndrome  dx'd with Charcot Massie Maroon Dz  in 2000 (age 71)  and is followed at [facility]. She also describes pains in her hands and fingers.       Patient also is followed expectantly for labile HTN. Patient's BP has been controlled at home and patient denies any cardiac symptoms as chest pain, palpitations, shortness of breath, dizziness or ankle swelling. Today's BP is at goal -106/72.      Patient's hyperlipidemia is controlled with diet and medications. Patient denies myalgias or other medication SE's. Last lipids were not at goal: Lab Results  Component Value Date   CHOL 175 09/16/2016   HDL 54 09/16/2016   LDLCALC 105 (H) 09/16/2016   TRIG 81 09/16/2016   CHOLHDL 3.2 09/16/2016      Patient has prediabetes (A1c 5.7%/2016) and patient denies reactive hypoglycemic symptoms, visual blurring, diabetic polys or paresthesias. Last A1c was Normal & at goal: Lab Results  Component Value Date   HGBA1C 5.5 09/16/2016      Finally, patient has history of Vitamin D Deficiency ("25"/2016)  and last Vitamin D was sl elevated and dosing was accordingly sl decreased: Lab Results  Component Value Date   VD25OH 113 (H) 09/16/2016   Current Outpatient Medications on File Prior to Visit  Medication Sig  . albuterol HFA  inhaler 2 puffs  every 6  hours as needed   .  cetirizine 10 MG tablet Take  every evening.  . fluticasone ( 0.05 % cream Apply 1 application topically 2 times daily.   Marland Kitchen ADVAIR DISKUS 250-50  Inhale 1 puff  every 12  hours.  . gabapentin 600 MG  Take 1 tab 3  times daily.  . Ginger 500 MG CAP Take 1 cap daily.  . Methocarbamol  500 MG tablet Take 1 tab 3 times daily as needed for muscle spasms.  . Montelukast 10  MG tablet Take  at bedtime.  Marland Kitchen PATADAY 0.2 % SOLN Apply to eye.  Marland Kitchen omeprazole (PRILOSEC) 40 MG c Take 1 cap daily.  Marland Kitchen oxyCODONE-acetaminophen  5-325 MG  Take 1 tab 2 (two) times daily.  . Vitamin D 50,000 units CAPS  TAKE 1 CAP  DAILY    Allergies  Allergen Reactions  . Linzess [Linaclotide] Rash  . Oxycontin [Oxycodone Hcl] Nausea And Vomiting  . Penicillins Hives  . Sulfa Antibiotics Hives  . Topamax [Topiramate] Hives   Past Medical History:  Diagnosis Date  . Allergy   . Asthma   . Chronic constipation   . CMT (Charcot-Marie-Tooth disease)   . GERD (gastroesophageal reflux disease)   . 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 Seasonal allergies   . Uterine polyp   . Wears glasses    Health Maintenance  Topic Date Due  . HIV Screening  06/18/1984  .  MAMMOGRAM  05/21/2015  . PAP SMEAR  09/13/2016  . INFLUENZA VACCINE  10/27/2016  . TETANUS/TDAP  09/17/2026   Immunization History  Administered Date(s) Administered  . PPD Test 08/26/2015, 09/16/2016  . Tdap 09/16/2016   Last Colon - Not Due  Last MGM - 05/20/2014 & reminded overdue.  Past Surgical History:  Procedure Laterality Date  . COLONOSCOPY N/A 11/01/2012   Procedure: COLONOSCOPY;  Surgeon: Rogene Houston, MD;  Location: [facility];  Service: Endoscopy;  Laterality: N/A;  130  . DILATION AND CURETTAGE OF UTERUS    . ESOPHAGEAL MANOMETRY  05/17/2011   Procedure: ESOPHAGEAL MANOMETRY (EM);  Surgeon: Beryle Beams, MD;  Location: [facility];  Service: Endoscopy;  Laterality: N/A;  .  FOOT NEUROMA SURGERY Right 06-05-2013  . HYSTEROSCOPY W/D&C N/A 06/27/2013   Procedure: HYSTEROSCOPY POLPYECTOMY  ;  Surgeon: Governor Specking, MD;  Location: [facility];  Service: Gynecology;  Laterality: N/A;  . LAPAROSCOPY N/A 06/27/2013   Procedure: LAPAROSCOPY WITH extensive lysis of adhesions;  Surgeon: Governor Specking, MD;  Location: [facility];  Service: Gynecology;  Laterality: N/A;  . TONSILLECTOMY    . TONSILLECTOMY  as child  . TUBAL LIGATION  1999  . tubal reconstruction  1997   No family history on file. Social History   Tobacco Use  . Smoking status: Never Smoker  . Smokeless tobacco: Never Used  Substance Use Topics  . Alcohol use: No  . Drug use: No    ROS Constitutional: Denies fever, chills, weight loss/gain, headaches, insomnia,  night sweats, and change in appetite. Does c/o fatigue. Eyes: Denies redness, blurred vision, diplopia, discharge, itchy, watery eyes.  ENT: Denies discharge, congestion, post nasal drip, epistaxis, sore throat, earache, hearing loss, dental pain, Tinnitus, Vertigo, Sinus pain, snoring.  Cardio: Denies chest pain, palpitations, irregular heartbeat, syncope, dyspnea, diaphoresis, orthopnea, PND, claudication, edema Respiratory: denies cough, dyspnea, DOE, pleurisy, hoarseness, laryngitis, wheezing.  Gastrointestinal: Denies dysphagia, heartburn, reflux, water brash, pain, cramps, nausea, vomiting, bloating, diarrhea, constipation, hematemesis, melena, hematochezia, jaundice, hemorrhoids Genitourinary: Denies dysuria, frequency, urgency, nocturia, hesitancy, discharge, hematuria, flank pain Breast: Breast lumps, nipple discharge, bleeding.  Musculoskeletal: Denies arthralgia, myalgia, stiffness, Jt. Swelling, pain, limp, and strain/sprain. Denies falls. Skin: Denies puritis, rash, hives, warts, acne, eczema, changing in skin lesion Neuro: No weakness, tremor, incoordination, spasms, paresthesia, pain Psychiatric:  Denies confusion, memory loss, sensory loss. Denies Depression. Endocrine: Denies change in weight, skin, hair change, nocturia, and paresthesia, diabetic polys, visual blurring, hyper / hypo glycemic episodes.  Heme/Lymph: No excessive bleeding, bruising, enlarged lymph nodes.  Physical Exam  BP 106/72   Pulse 60   Temp 97.7 F (36.5 C)   Resp 16   Ht 5' 4.75" (1.645 m)   Wt 132 lb 9.6 oz (60.1 kg)   BMI 22.24 kg/m   General Appearance: Well nourished, well groomed and in no apparent distress.  Eyes: PERRLA, EOMs, conjunctiva no swelling or erythema, normal fundi and vessels. Sinuses: No frontal/maxillary tenderness ENT/Mouth: EACs patent / TMs  nl. Nares clear without erythema, swelling, mucoid exudates. Oral hygiene is good. No erythema, swelling, or exudate. Tongue normal, non-obstructing. Tonsils not swollen or erythematous. Hearing normal.  Neck: Supple, thyroid not palpable. No bruits, nodes or JVD. Respiratory: Respiratory effort normal.  BS equal and clear bilateral without rales, rhonci, wheezing or stridor. Cardio: Heart sounds are normal with regular rate and rhythm and no murmurs, rubs or gallops. Peripheral pulses are normal and equal bilaterally without  edema. No aortic or femoral bruits. Chest: symmetric with normal excursions and percussion. Breasts: Symmetric, without lumps, nipple discharge, retractions, or fibrocystic changes.  Abdomen: Flat, soft with bowel sounds active. Nontender, no guarding, rebound, hernias, masses, or organomegaly.  Lymphatics: Non tender without lymphadenopathy.  Genitourinary:  Musculoskeletal: Full ROM all peripheral extremities, joint stability, 5/5 strength, and normal gait. Skin: Warm and dry without rashes, lesions, cyanosis, clubbing or  ecchymosis.  Neuro: Cranial nerves intact, reflexes equal bilaterally. Normal muscle tone, no cerebellar symptoms. Sensation intact.  Pysch: Alert and oriented X 3, normal affect, Insight and  Judgment appropriate.   Assessment and Plan  1. Annual Preventative Screening Examination  2. Labile hypertension  - EKG 12-Lead - Urinalysis, Routine w reflex microscopic - Microalbumin / creatinine urine ratio - CBC with Differential/Platelet - BASIC METABOLIC PANEL WITH GFR - Magnesium - TSH  3. Hyperlipidemia, mixed  - EKG 12-Lead - Hepatic function panel - Lipid panel - TSH  4. Abnormal glucose  - Hemoglobin A1c - Insulin, random  5. Vitamin D deficiency  - VITAMIN D 25 Hydroxyl  6. Prediabetes  - EKG 12-Lead - Hemoglobin A1c - Insulin, random  7. Gastroesophageal reflux disease  - CBC with Differential/Platelet  8. CMT (Charcot-Marie-Tooth disease)  9. Screening for colorectal cancer  - POC Hemoccult Bld/Stl  10. Screening for ischemic heart disease  - EKG 12-Lead  11. Fatigue  - Iron,Total/Total Iron Binding Cap - Vitamin B12 - CBC with Differential/Platelet  12. Medication management  - Urinalysis, Routine w reflex microscopic - Microalbumin / creatinine urine ratio - CBC with Differential/Platelet - BASIC METABOLIC PANEL WITH GFR - Hepatic function panel - Magnesium - Lipid panel - TSH - Hemoglobin A1c - Insulin, random - VITAMIN D 25 Hydroxyl  13. Iron deficiency anemia  - Iron,Total/Total Iron Binding Cap  14. Anemia due to vitamin B12 deficiency  - Vitamin B12        Patient was counseled in prudent diet to achieve/maintain BMI less than 25 for weight control, BP monitoring, regular exercise and medications. Discussed med's effects and SE's. Screening labs and tests as requested with regular follow-up as recommended. Over 40 minutes of exam, counseling, chart review and high complex critical decision making was performed.

## 2017-05-26 DIAGNOSIS — J3081 Allergic rhinitis due to animal (cat) (dog) hair and dander: Secondary | ICD-10-CM | POA: Diagnosis not present

## 2017-05-26 DIAGNOSIS — J301 Allergic rhinitis due to pollen: Secondary | ICD-10-CM | POA: Diagnosis not present

## 2017-05-26 DIAGNOSIS — J3089 Other allergic rhinitis: Secondary | ICD-10-CM | POA: Diagnosis not present

## 2017-05-26 LAB — CBC WITH DIFFERENTIAL/PLATELET
Basophils Absolute: 29 cells/uL (ref 0–200)
Basophils Relative: 0.7 %
Eosinophils Absolute: 118 cells/uL (ref 15–500)
Eosinophils Relative: 2.8 %
HCT: 41 % (ref 35.0–45.0)
Hemoglobin: 13 g/dL (ref 11.7–15.5)
Lymphs Abs: 1785 cells/uL (ref 850–3900)
MCH: 25.2 pg — ABNORMAL LOW (ref 27.0–33.0)
MCHC: 31.7 g/dL — ABNORMAL LOW (ref 32.0–36.0)
MCV: 79.6 fL — ABNORMAL LOW (ref 80.0–100.0)
MPV: 10 fL (ref 7.5–12.5)
Monocytes Relative: 9 %
Neutro Abs: 1890 cells/uL (ref 1500–7800)
Neutrophils Relative %: 45 %
Platelets: 353 10*3/uL (ref 140–400)
RBC: 5.15 10*6/uL — ABNORMAL HIGH (ref 3.80–5.10)
RDW: 13 % (ref 11.0–15.0)
Total Lymphocyte: 42.5 %
WBC mixed population: 378 cells/uL (ref 200–950)
WBC: 4.2 10*3/uL (ref 3.8–10.8)

## 2017-05-26 LAB — BASIC METABOLIC PANEL WITH GFR
BUN: 8 mg/dL (ref 7–25)
CO2: 28 mmol/L (ref 20–32)
Calcium: 10.2 mg/dL (ref 8.6–10.2)
Chloride: 103 mmol/L (ref 98–110)
Creat: 0.76 mg/dL (ref 0.50–1.10)
GFR, Est African American: 108 mL/min/{1.73_m2} (ref >=60)
GFR, Est Non African American: 93 mL/min/{1.73_m2} (ref >=60)
Glucose, Bld: 84 mg/dL (ref 65–99)
Potassium: 4.2 mmol/L (ref 3.5–5.3)
Sodium: 139 mmol/L (ref 135–146)

## 2017-05-26 LAB — HEPATIC FUNCTION PANEL
AG Ratio: 1.7 (calc) (ref 1.0–2.5)
ALT: 12 U/L (ref 6–29)
AST: 17 U/L (ref 10–35)
Albumin: 4.6 g/dL (ref 3.6–5.1)
Alkaline phosphatase (APISO): 78 U/L (ref 33–115)
Bilirubin, Direct: 0.1 mg/dL (ref 0.0–0.2)
Globulin: 2.7 g/dL (calc) (ref 1.9–3.7)
Indirect Bilirubin: 0.5 mg/dL (calc) (ref 0.2–1.2)
Total Bilirubin: 0.6 mg/dL (ref 0.2–1.2)
Total Protein: 7.3 g/dL (ref 6.1–8.1)

## 2017-05-26 LAB — LIPID PANEL
Cholesterol: 211 mg/dL — ABNORMAL HIGH (ref <200)
HDL: 66 mg/dL (ref >50)
LDL Cholesterol (Calc): 130 mg/dL (calc) — ABNORMAL HIGH
Non-HDL Cholesterol (Calc): 145 mg/dL (calc) — ABNORMAL HIGH (ref <130)
Total CHOL/HDL Ratio: 3.2 (calc) (ref <5.0)
Triglycerides: 63 mg/dL (ref <150)

## 2017-05-26 LAB — IRON, TOTAL/TOTAL IRON BINDING CAP
%SAT: 42 % (calc) (ref 11–50)
Iron: 155 ug/dL (ref 40–190)
TIBC: 369 mcg/dL (calc) (ref 250–450)

## 2017-05-26 LAB — URINALYSIS, ROUTINE W REFLEX MICROSCOPIC
Bilirubin Urine: NEGATIVE
Glucose, UA: NEGATIVE
Hgb urine dipstick: NEGATIVE
Ketones, ur: NEGATIVE
Leukocytes, UA: NEGATIVE
Nitrite: NEGATIVE
Protein, ur: NEGATIVE
Specific Gravity, Urine: 1.025 (ref 1.001–1.03)
pH: <=5 (ref 5.0–8.0)

## 2017-05-26 LAB — MICROALBUMIN / CREATININE URINE RATIO
Creatinine, Urine: 236 mg/dL (ref 20–275)
Microalb Creat Ratio: 4 mcg/mg creat (ref <30)
Microalb, Ur: 0.9 mg/dL

## 2017-05-26 LAB — HEMOGLOBIN A1C
Hgb A1c MFr Bld: 5.6 % of total Hgb (ref <5.7)
Mean Plasma Glucose: 114 (calc)
eAG (mmol/L): 6.3 (calc)

## 2017-05-26 LAB — TSH: TSH: 1.13 mIU/L

## 2017-05-26 LAB — VITAMIN D 25 HYDROXY (VIT D DEFICIENCY, FRACTURES): Vit D, 25-Hydroxy: 102 ng/mL — ABNORMAL HIGH (ref 30–100)

## 2017-05-26 LAB — MAGNESIUM: Magnesium: 2 mg/dL (ref 1.5–2.5)

## 2017-05-26 LAB — INSULIN, RANDOM: Insulin: 2.6 u[IU]/mL (ref 2.0–19.6)

## 2017-05-26 LAB — VITAMIN B12: Vitamin B-12: 448 pg/mL (ref 200–1100)

## 2017-05-29 ENCOUNTER — Encounter: Payer: Self-pay | Admitting: Internal Medicine

## 2017-05-30 ENCOUNTER — Ambulatory Visit: Payer: Medicare Other | Admitting: Physician Assistant

## 2017-06-01 DIAGNOSIS — J3081 Allergic rhinitis due to animal (cat) (dog) hair and dander: Secondary | ICD-10-CM | POA: Diagnosis not present

## 2017-06-01 DIAGNOSIS — J3089 Other allergic rhinitis: Secondary | ICD-10-CM | POA: Diagnosis not present

## 2017-06-01 DIAGNOSIS — J301 Allergic rhinitis due to pollen: Secondary | ICD-10-CM | POA: Diagnosis not present

## 2017-06-09 ENCOUNTER — Ambulatory Visit (INDEPENDENT_AMBULATORY_CARE_PROVIDER_SITE_OTHER): Payer: Medicare Other | Admitting: Physician Assistant

## 2017-06-09 ENCOUNTER — Encounter: Payer: Self-pay | Admitting: Physician Assistant

## 2017-06-09 VITALS — BP 104/68 | HR 64 | Temp 97.5°F | Resp 16 | Ht 64.75 in | Wt 136.8 lb

## 2017-06-09 DIAGNOSIS — R519 Headache, unspecified: Secondary | ICD-10-CM

## 2017-06-09 DIAGNOSIS — R51 Headache: Secondary | ICD-10-CM | POA: Diagnosis not present

## 2017-06-09 DIAGNOSIS — M542 Cervicalgia: Secondary | ICD-10-CM

## 2017-06-09 DIAGNOSIS — K219 Gastro-esophageal reflux disease without esophagitis: Secondary | ICD-10-CM | POA: Diagnosis not present

## 2017-06-09 DIAGNOSIS — J45909 Unspecified asthma, uncomplicated: Secondary | ICD-10-CM | POA: Diagnosis not present

## 2017-06-09 DIAGNOSIS — R7303 Prediabetes: Secondary | ICD-10-CM | POA: Diagnosis not present

## 2017-06-09 DIAGNOSIS — M2550 Pain in unspecified joint: Secondary | ICD-10-CM

## 2017-06-09 DIAGNOSIS — G600 Hereditary motor and sensory neuropathy: Secondary | ICD-10-CM

## 2017-06-09 DIAGNOSIS — E559 Vitamin D deficiency, unspecified: Secondary | ICD-10-CM | POA: Diagnosis not present

## 2017-06-09 DIAGNOSIS — R5383 Other fatigue: Secondary | ICD-10-CM

## 2017-06-09 DIAGNOSIS — Z0001 Encounter for general adult medical examination with abnormal findings: Secondary | ICD-10-CM | POA: Diagnosis not present

## 2017-06-09 DIAGNOSIS — K5909 Other constipation: Secondary | ICD-10-CM | POA: Diagnosis not present

## 2017-06-09 DIAGNOSIS — I10 Essential (primary) hypertension: Secondary | ICD-10-CM | POA: Diagnosis not present

## 2017-06-09 DIAGNOSIS — R6889 Other general symptoms and signs: Secondary | ICD-10-CM | POA: Diagnosis not present

## 2017-06-09 DIAGNOSIS — J301 Allergic rhinitis due to pollen: Secondary | ICD-10-CM | POA: Diagnosis not present

## 2017-06-09 DIAGNOSIS — Z79899 Other long term (current) drug therapy: Secondary | ICD-10-CM

## 2017-06-09 DIAGNOSIS — Z7189 Other specified counseling: Secondary | ICD-10-CM | POA: Diagnosis not present

## 2017-06-09 DIAGNOSIS — J3089 Other allergic rhinitis: Secondary | ICD-10-CM | POA: Diagnosis not present

## 2017-06-09 DIAGNOSIS — Z0000 Encounter for general adult medical examination without abnormal findings: Secondary | ICD-10-CM

## 2017-06-09 DIAGNOSIS — R55 Syncope and collapse: Secondary | ICD-10-CM

## 2017-06-09 DIAGNOSIS — Z6822 Body mass index (BMI) 22.0-22.9, adult: Secondary | ICD-10-CM | POA: Diagnosis not present

## 2017-06-09 DIAGNOSIS — E782 Mixed hyperlipidemia: Secondary | ICD-10-CM

## 2017-06-09 DIAGNOSIS — Z136 Encounter for screening for cardiovascular disorders: Secondary | ICD-10-CM | POA: Diagnosis not present

## 2017-06-09 DIAGNOSIS — J3081 Allergic rhinitis due to animal (cat) (dog) hair and dander: Secondary | ICD-10-CM | POA: Diagnosis not present

## 2017-06-09 MED ORDER — GABAPENTIN 600 MG PO TABS: 600.0000 mg | ORAL_TABLET | Freq: Three times a day (TID) | ORAL | 1 refills | Status: DC

## 2017-06-09 MED ORDER — ALBUTEROL SULFATE HFA 108 (90 BASE) MCG/ACT IN AERS: 2.0000 | INHALATION_SPRAY | Freq: Four times a day (QID) | RESPIRATORY_TRACT | 4 refills | Status: DC | PRN

## 2017-06-09 MED ORDER — METHOCARBAMOL 500 MG PO TABS: 500.0000 mg | ORAL_TABLET | Freq: Three times a day (TID) | ORAL | 1 refills | Status: DC | PRN

## 2017-06-09 MED ORDER — VITAMIN D (ERGOCALCIFEROL) 1.25 MG (50000 UNIT) PO CAPS: 50000.0000 [IU] | ORAL_CAPSULE | ORAL | 0 refills | Status: DC

## 2017-06-09 MED ORDER — MONTELUKAST SODIUM 10 MG PO TABS: 10.0000 mg | ORAL_TABLET | Freq: Every day | ORAL | 1 refills | Status: AC

## 2017-06-09 MED ORDER — OMEPRAZOLE 40 MG PO CPDR: 40.0000 mg | DELAYED_RELEASE_CAPSULE | Freq: Every day | ORAL | 3 refills | Status: DC

## 2017-06-09 MED ORDER — FLUTICASONE-SALMETEROL 250-50 MCG/DOSE IN AEPB: 1.0000 | INHALATION_SPRAY | Freq: Two times a day (BID) | RESPIRATORY_TRACT | 3 refills | Status: DC

## 2017-06-09 MED ORDER — VENLAFAXINE HCL ER 37.5 MG PO CP24: ORAL_CAPSULE | ORAL | 1 refills | Status: DC

## 2017-06-09 MED ORDER — OLOPATADINE HCL 0.2 % OP SOLN
OPHTHALMIC | 3 refills | Status: DC
Start: 2017-06-09 — End: 2020-07-23

## 2017-06-09 NOTE — Patient Instructions (Addendum)
[facility]  7 a.m.-6:30 p.m., Monday 7 a.m.-5 p.m., Tuesday-Friday Schedule an appointment by calling [phone]. Encourage you to get the 3D Mammogram  The 3D Mammogram is much more specific and sensitive to pick up breast cancer. For women with fibrocystic breast or lumpy breast it can be hard to determine if it is cancer or not but the 3D mammogram is able to tell this difference which cuts back on unneeded additional tests or scary call backs.   - over 40% increase in detection of breast cancer - over 40% reduction in false positives.  - fewer call backs - reduced anxiety - improved outcomes - [facility]   Benefiber or Citracel is good for constipation/diarrhea/irritable bowel syndrome, it helps with weight loss and can help lower your bad cholesterol. Please do 1 TBSP in the morning in water, coffee, or tea. It can take up to a month before you can see a difference with your bowel movements. It is cheapest from [employer], [employer], [employer].   Get back on miralax  Take vitamin D every 3 days  Start on the effexor 37.5 x 1-2 weeks and then go up to 2 a day and let me know how you do   To prevent migraines you can try these natural/OTC medications as prevention: 1) Melatonin 5mg -15mg  30 mins before bed 2) Riboflavin/B2 400mg  daily 3) CoQ10 100mg  3 x a day  We may also treat TMJ if we think you have it If you are having frequent migraines we may put you on a once a day medication with fast acting medication to take. Also there is such a thing called rebound headache from over use of acute medications.  Please do not use rescue or acute medications more than 10 days a month or more than 3 days per week, this can cause a withdrawal and a rebound headache.  Here is more information below  Please remember, common headache triggers are: sleep deprivation, dehydration, overheating, stress, hypoglycemia or skipping meals and blood sugar fluctuations,  excessive pain medications or excessive alcohol use or caffeine withdrawal. Some people have food triggers such as aged cheese, orange juice or chocolate, especially dark chocolate, or MSG (monosodium glutamate). Try to avoid these headache triggers as much possible.   It may be helpful to keep a headache diary to figure out what makes your headaches worse or brings them on and what alleviates them. Some people report headache onset after exercise but studies have shown that regular exercise may actually prevent headaches from coming. If you have exercise-induced headaches, please make sure that you drink plenty of fluid before and after exercising and that you do not over do it and do not overheat.   Please go to the ER if there is weakness, thunderclap headache, visual changes, or any concerning factors    Migraine Headache A migraine headache is an intense, throbbing pain on one or both sides of your head. Recurrent migraines keep coming back. A migraine can last for 30 minutes to several hours. CAUSES  The exact cause of a migraine headache is not always known. However, a migraine may be caused when nerves in the brain become irritated and release chemicals that cause inflammation. This causes pain. Certain things may also trigger migraines, such as:   Alcohol.  Smoking.  Stress.  Menstruation.  Aged cheeses.  Foods or drinks that contain nitrates, glutamate, aspartame, or tyramine.  Lack of sleep.  Chocolate.  Caffeine.  Hunger.  Physical exertion.  Fatigue.  Medicines used to treat chest pain (nitroglycerine), birth control pills, estrogen, and some blood pressure medicines. SYMPTOMS   Pain on one or both sides of your head.  Pulsating or throbbing pain.  Severe pain that prevents daily activities.  Pain that is aggravated by any physical activity.  Nausea, vomiting, or both.  Dizziness.  Pain with exposure to bright lights, loud noises, or  activity.  General sensitivity to bright lights, loud noises, or smells. Before you get a migraine, you may get warning signs that a migraine is coming (aura). An aura may include:  Seeing flashing lights.  Seeing bright spots, halos, or zigzag lines.  Having tunnel vision or blurred vision.  Having feelings of numbness or tingling.  Having trouble talking.  Having muscle weakness. DIAGNOSIS  A recurrent migraine headache is often diagnosed based on:  Symptoms.  Physical examination.  A CT scan or MRI of your head. These imaging tests cannot diagnose migraines but can help rule out other causes of headaches.  TREATMENT  Medicines may be given for pain and nausea. Medicines can also be given to help prevent recurrent migraines. HOME CARE INSTRUCTIONS  Only take over-the-counter or prescription medicines for pain or discomfort as directed by your health care provider. The use of long-term narcotics is not recommended.  Lie down in a dark, quiet room when you have a migraine.  Keep a journal to find out what may trigger your migraine headaches. For example, write down:  What you eat and drink.  How much sleep you get.  Any change to your diet or medicines.  Limit alcohol consumption.  Quit smoking if you smoke.  Get 7-9 hours of sleep, or as recommended by your health care provider.  Limit stress.  Keep lights dim if bright lights bother you and make your migraines worse. SEEK MEDICAL CARE IF:   You do not get relief from the medicines given to you.  You have a recurrence of pain.  You have a fever. SEEK IMMEDIATE MEDICAL CARE IF:  Your migraine becomes severe.  You have a stiff neck.  You have loss of vision.  You have muscular weakness or loss of muscle control.  You start losing your balance or have trouble walking.  You feel faint or pass out. You have severe symptoms that are different from your first symptoms. MAKE SURE YOU:   Understand  these instructions.  Will watch your condition.  Will get help right away if you are not doing well or get worse.   This information is not intended to replace advice given to you by your health care provider. Make sure you discuss any questions you have with your health care provider.   Document Released: 12/08/2000 Document Revised: 04/05/2014 Document Reviewed: 11/20/2012 Elsevier Interactive Patient Education 2016 [employer].  Common Migraine Triggers   Foods Aged cheese, alcohol, nuts, chocolate, yogurt, onions, figs, liver, caffeinated foods and beverages, monosodium glutamate (MSG), smoked or pickled fish/meat, nitrate/nitrate preserved foods (hotdogs, pepperoni, salami) tyramine  Medications Antibiotics (tetracycline, griseofulvin), antihypertensives (nifedipine, captopril), hormones (oral contraceptives, estrogens), histamine-2 blockers (cimetidine, raniidine, vasodilators (nitroglycerine, isosorbide dinitrate)  Sensory Stimuli Flickering/bright/fluorescent lights, bright sunlight, odors (perfume, chemicals, cigarette smoke)  Lifestyle Changes Time zones, sleep patterns, eating habits, caffeine withdrawal stress  Other Menstrual cycle, weather/season/air pressure changes, high altitude  Adapted from [place], [place]. [facility] [place]; [place] and [place]. Conquering Headache, 1998  Hormonal variations also are believed to play a part.  Fluctuations of the female hormone estrogen (such as just before menstruation) affect a chemical called serotonin-when serotonin levels in the brain fall, the dilation (expansion) of blood vessels in the brain that is characteristic of migraine often follows.  Many factors or "triggers" can start a migraine.  In people who get migraines, most experts think certain activities or foods may trigger temporary changes in the blood vessels around the brain.  Swelling of these blood vessels may cause pain in the nearby nerves.  Allergy  Headaches:  Hotdogs Milk  Onions  Thyme Bacon  Chocolate Garlic  Nutmeg Ham  Dark Cola Pork  Cinnamon Salami  Nuts  Egg  Ginger Sausage Red wine Cloves  Cheddar Cheese Caffeine  What is the TMJ? The temporomandibular (tem-PUH-ro-man-DIB-[id number]-ler) joint, or the TMJ, connects the upper and lower jawbones. This joint allows the jaw to open wide and move back and forth when you chew, talk, or yawn.There are also several muscles that help this joint move. There can be muscle tightness and pain in the muscle that can cause several symptoms.  What causes TMJ pain? There are many causes of TMJ pain. Repeated chewing (for example, chewing gum) and clenching your teeth can cause pain in the joint. Some TMJ pain has no obvious cause. What can I do to ease the pain? There are many things you can do to help your pain get better. When you have pain:  Eat soft foods and stay away from chewy foods (for example, taffy) Try to use both sides of your mouth to chew Don't chew gum Massage Don't open your mouth wide (for example, during yawning or singing) Don't bite your cheeks or fingernails Lower your amount of stress and worry Applying a warm, damp washcloth to the joint may help. Over-the-counter pain medicines such as ibuprofen (one brand: Advil) or acetaminophen (one brand: Tylenol) might also help. Do not use these medicines if you are allergic to them or if your doctor told you not to use them. How can I stop the pain from coming back? When your pain is better, you can do these exercises to make your muscles stronger and to keep the pain from coming back:  Resisted mouth opening: Place your thumb or two fingers under your chin and open your mouth slowly, pushing up lightly on your chin with your thumb. Hold for three to six seconds. Close your mouth slowly. Resisted mouth closing: Place your thumbs under your chin and your two index fingers on the ridge between your mouth and the bottom of your chin.  Push down lightly on your chin as you close your mouth. Tongue up: Slowly open and close your mouth while keeping the tongue touching the roof of the mouth. Side-to-side jaw movement: Place an object about one fourth of an inch thick (for example, two tongue depressors) between your front teeth. Slowly move your jaw from side to side. Increase the thickness of the object as the exercise becomes easier Forward jaw movement: Place an object about one fourth of an inch thick between your front teeth and move the bottom jaw forward so that the bottom teeth are in front of the top teeth. Increase the thickness of the object as the exercise becomes easier. These exercises should not be painful. If it hurts to do these exercises, stop doing them and talk to your family doctor.

## 2017-06-09 NOTE — Progress Notes (Signed)
MEDICARE ANNUAL WELLNESS VISIT AND FU  Assessment:   Syncope, unspecified syncope type -     EKG 12-Lead- sinus brady no changes -     Ambulatory referral to Cardiology -     Ambulatory referral to Neurology - ? Need EEG/sleep study versus migraine versus anxiety/depression- wants second opinon about HA/syncopal episodes, patient believes it is related to cyst but last MRI shows unchanged in size and no mass effect. Start on effexor for depression/anxiety and migraine prevention.  - patient with bradycardia, abnormal heart beats and questionable syncope  Nonintractable headache, unspecified chronicity pattern, unspecified headache type -     Ambulatory referral to Neurology -     venlafaxine XR (EFFEXOR XR) 37.5 MG 24 hr capsule; 1 tab for 1-2 weeks and then go up to 2 in AM. -     methocarbamol (ROBAXIN) 500 MG tablet; Take 1 tablet (500 mg total) by mouth 3 (three) times daily as needed for muscle spasms. - check ESR, CBC- ? 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 From neck pain- continue meds at this time  Neck pain -     gabapentin (NEURONTIN) 600 MG tablet; Take 1 tablet (600 mg total) by mouth 3 (three) times daily. -     methocarbamol (ROBAXIN) 500 MG tablet; Take 1 tablet (500 mg total) by mouth 3 (three) times daily as needed for muscle spasms.  CMT (Charcot-Marie-Tooth disease) Continue follow up with pain management, neuro  Other fatigue Recent normal labs  Mixed hyperlipidemia -continue medications, check lipids, decrease fatty foods, increase activity.   Medication management  Hypertension, unspecified type - continue medications, DASH diet, exercise and monitor at home. Call if greater than 130/80.   Prediabetes Better with weight loss  Pain in joint, multiple sites -     gabapentin (NEURONTIN) 600 MG tablet; Take 1 tablet (600 mg total) by mouth 3 (three)  times daily. -     methocarbamol (ROBAXIN) 500 MG tablet; Take 1 tablet (500 mg total) by mouth 3 (three) times daily as needed for muscle spasms. - continue follow up pain management  Vitamin D deficiency -     Vitamin D, Ergocalciferol, (DRISDOL) 50000 units CAPS capsule; Take 1 capsule (50,000 Units total) by mouth every 3 (three) days. - will cut back  Other constipation Get back on miralax  Gastroesophageal reflux disease, esophagitis presence not specified -     omeprazole (PRILOSEC) 40 MG capsule; Take 1 capsule (40 mg total) by mouth daily.  Chronic asthma without complication, unspecified asthma severity, unspecified whether persistent -     albuterol (PROAIR HFA) 108 (90 Base) MCG/ACT inhaler; Inhale 2 puffs into the lungs every 6 (six) hours as needed for wheezing or shortness of breath. -     Fluticasone-Salmeterol (ADVAIR DISKUS) 250-50 MCG/DOSE AEPB; Inhale 1 puff into the lungs every 12 (twelve) hours. -     montelukast (SINGULAIR) 10 MG tablet; Take 1 tablet (10 mg total) by mouth at bedtime. -     Olopatadine HCl (PATADAY) 0.2 % SOLN; Apply to each eye as needed  Encounter for Medicare annual wellness exam 1 year Get MGM  BMI 22.0-22.9, adult monitoir  Advanced care planning/counseling discussion Papers given to patient to fill out, importance discussed  Gastroesophageal reflux disease without esophagitis -     omeprazole (PRILOSEC) 40 MG capsule; Take 1 capsule (40 mg total) by  mouth daily.     Over 40 minutes of exam, counseling, chart review and critical decision making was performed Future Appointments  Date Time Provider [place]  09/15/2017 10:30 AM Liane Comber, NP [facility] None  12/22/2017  9:30 AM Unk Pinto, MD [facility] None  07/04/2018  3:00 PM Unk Pinto, MD [facility] None     Plan:   During the course of the visit the patient was educated and counseled about appropriate screening and preventive services including:     Pneumococcal vaccine   Prevnar 13  Influenza vaccine  Td vaccine  Screening electrocardiogram  Bone densitometry screening  Colorectal cancer screening  Diabetes screening  Glaucoma screening  Nutrition counseling   Advanced directives: requested   Subjective:  Brandi Erickson is a 47 y.o. female who presents for Medicare Annual Wellness Visit and follow up for predM, chol, HTN.  Her blood pressure has been controlled at home, today their BP is BP: 104/68  She has chronic pain, follows with Dr. Andree Elk preferred pain. Has history of CMT, neck pain. 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  She has had fever, chills, sinus issues.  Head at top was sore to touch, she had some imbalance, and did not want to get out of bed. She has bilateral neck pain, no pain down her arms, she has TMJ history but does not have a night guard, just had implant done on right upper tooth. Has had increased stress with divorce, per patient she was defending herself from her husband and he charged her with assault, had to go to jail but currently that charge is being dropped.   She also states she has had "years" of syncopal episodes, very sporadic. She states she will just feel extreme exhaustion and that "feeling you get when you black out" so she always makes it to a chair/bed to sleep it off for 3-4 hours, some confusion when she wakes up. She states she has had some chest pain and abnormal heart beats. Has never had EEG. She does have head/migraine history. Nothing provokes them. She will sometimes be able to hear things happening while she is "shut down" but states she can not respond.   Has history of hemorrhoids s/p surgery at [facility].  She has history of a large fibroid/enlarged uterus, negative endometrial  biopsy, seeing [facility].    She does not workout. She denies chest pain, shortness of breath, dizziness.  She is not on cholesterol medication and denies myalgias. Her cholesterol is at goal. The cholesterol last visit was:   Lab Results  Component Value Date   CHOL 211 (H) 05/25/2017   HDL 66 05/25/2017   LDLCALC 130 (H) 05/25/2017   TRIG 63 05/25/2017   CHOLHDL 3.2 05/25/2017    She has been working on diet and exercise for prediabetes, and denies polydipsia, polyuria and visual disturbances. Last A1C in the office was:  Lab Results  Component Value Date   HGBA1C 5.6 05/25/2017   Patient is on Vitamin D supplement.   Lab Results  Component Value Date   VD25OH 102 (H) 05/25/2017     BMI is Body mass index is 22.94 kg/m., she is working on diet and exercise. Wt Readings from Last 3 Encounters:  06/09/17 136  lb 12.8 oz (62.1 kg)  05/25/17 132 lb 9.6 oz (60.1 kg)  09/16/16 152 lb 12.8 oz (69.3 kg)     Medication Review: Current Outpatient Medications on File Prior to Visit  Medication Sig Dispense Refill  . albuterol (PROAIR HFA) 108 (90 BASE) MCG/ACT inhaler Inhale 2 puffs into the lungs every 6 (six) hours as needed for wheezing or shortness of breath.    . cetirizine (ZYRTEC) 10 MG tablet Take 10 mg by mouth every evening.    . fluticasone (CUTIVATE) 0.05 % cream Apply 1 application topically 2 (two) times daily.     . Fluticasone-Salmeterol (ADVAIR DISKUS) 250-50 MCG/DOSE AEPB Inhale 1 puff into the lungs every 12 (twelve) hours.    . gabapentin (NEURONTIN) 600 MG tablet Take 1 tablet (600 mg total) by mouth 3 (three) times daily. 270 tablet 0  . Ginger 500 MG CAPS Take 1 capsule by mouth daily.    . methocarbamol (ROBAXIN) 500 MG tablet Take 1 tablet (500 mg total) by mouth 3 (three) times daily as needed for muscle spasms. 270 tablet 1  . montelukast (SINGULAIR) 10 MG tablet Take 10 mg by mouth at bedtime.    . Olopatadine HCl (PATADAY) 0.2 % SOLN Apply to eye.    Marland Kitchen  omeprazole (PRILOSEC) 40 MG capsule Take 1 capsule (40 mg total) by mouth daily. 90 capsule 3  . oxyCODONE-acetaminophen (PERCOCET/ROXICET) 5-325 MG tablet Take 1 tablet by mouth 2 (two) times daily.    . 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     Allergies  Allergen Reactions  . Linzess [Linaclotide] Rash  . Oxycontin [Oxycodone Hcl] Nausea And Vomiting  . Penicillins Hives  . Sulfa Antibiotics Hives  . Topamax [Topiramate] Hives    Current Problems (verified) Patient Active Problem List   Diagnosis Date Noted  . Hypertension 05/07/2015  . Mixed hyperlipidemia 09/13/2014  . Prediabetes 09/13/2014  . Other fatigue 09/11/2014  . Vitamin D deficiency 09/11/2014  . Pain in joint, multiple sites 09/11/2014  . Medication management 09/11/2014  . GERD 08/14/2012  . Asthma, chronic 08/14/2012  . CMT (Charcot-Marie-Tooth disease) 08/14/2012  . Constipation 08/14/2012    Screening Tests Immunization History  Administered Date(s) Administered  . PPD Test 08/26/2015, 09/16/2016  . Tdap 09/16/2016    Preventative care: Last colonoscopy: N/A due age 17 EGD 2013 Last mammogram: 2017 DUE given number Last pap smear/pelvic exam: s/p hysterectomy DEXA:N/A MRI [facility] 08/2016 normal- no change in pineal cyst.   Prior vaccinations: TD or Tdap: 2018  Influenza: declines Pneumococcal: N/A Prevnar13: N/A Shingles/Zostavax: N/A  Names of Other Physician/Practitioners you currently use: 1. [facility] here for primary care 2. Dr. Katy Fitch, eye doctor, last visit 2018, has ov next week 3. Dr. Posey Pronto, dentist, last visit q 6 months Patient Care Team: Unk Pinto, MD as PCP - General (Internal Medicine) Blanch Media, MD as Consulting Physician  (Neurology) Renie Ora, MD as Consulting Physician (Anesthesiology) Wylene Simmer, MD as Consulting Physician (Orthopedic Surgery) Susa Day, MD as Consulting Physician (Orthopedic Surgery) Rogene Houston, MD as Consulting Physician (Gastroenterology)  SURGICAL HISTORY She  has a past surgical history that includes Esophageal manometry (05/17/2011); Tonsillectomy; tubal reconstruction (1997); Tubal ligation (1999); Dilation and curettage of uterus; Colonoscopy (N/A, 11/01/2012); Foot neuroma surgery (  Right, 06-05-2013); Tonsillectomy (as child); Hysteroscopy w/D&C (N/A, 06/27/2013); and laparoscopy (N/A, 06/27/2013). FAMILY HISTORY Her family history is not on file. SOCIAL HISTORY She  reports that  has never smoked. she has never used smokeless tobacco. She reports that she does not drink alcohol or use drugs.  MEDICARE WELLNESS OBJECTIVES: Physical activity: Current Exercise Habits: Home exercise routine, Type of exercise: walking, Time (Minutes): 45, Frequency (Times/Week): 7, Weekly Exercise (Minutes/Week): 315, Intensity: Moderate Cardiac risk factors: Cardiac Risk Factors include: dyslipidemia;hypertension Depression/mood screen:   Depression screen [facility] 2/9 06/09/2017  Decreased Interest 0  Down, Depressed, Hopeless 0  PHQ - 2 Score 0    ADLs:  In your present state of health, do you have any difficulty performing the following activities: 06/09/2017 09/16/2016  Hearing? N N  Vision? N N  Difficulty concentrating or making decisions? N N  Walking or climbing stairs? Y N  Dressing or bathing? N N  Doing errands, shopping? N N  Some recent data might be hidden     Cognitive Testing  Alert? Yes  Normal Appearance?Yes  Oriented to person? Yes  Place? Yes   Time? Yes  Recall of three objects?  Yes  Can perform simple calculations? Yes  Displays appropriate judgment?Yes  Can read the correct time from a watch face?Yes  EOL planning: Does Patient Have a Medical Advance  Directive?: No Would patient like information on creating a medical advance directive?: Yes (MAU/Ambulatory/Procedural Areas - Information given)  Review of Systems  Constitutional: Positive for malaise/fatigue. Negative for chills, diaphoresis, fever and weight loss.  HENT: Positive for congestion. Negative for ear discharge, ear pain, hearing loss, nosebleeds, sore throat and tinnitus.   Eyes: Negative.   Respiratory: Negative for cough, hemoptysis, sputum production, shortness of breath, wheezing and stridor.   Cardiovascular: Negative for chest pain, palpitations, orthopnea, claudication and leg swelling.  Gastrointestinal: Positive for abdominal pain and constipation. Negative for blood in stool, diarrhea, heartburn, melena, nausea and vomiting.  Genitourinary: Negative.   Musculoskeletal: Positive for back pain, joint pain, myalgias and neck pain. Negative for falls.  Skin: Negative.  Negative for rash.  Neurological: Positive for headaches. Negative for dizziness, tingling, tremors, sensory change, speech change, focal weakness, seizures, loss of consciousness and weakness.  Psychiatric/Behavioral: Negative.  Negative for depression and memory loss. The patient does not have insomnia.      Objective:     Today's Vitals   06/09/17 1116  BP: 104/68  Pulse: 64  Resp: 16  Temp: (!) 97.5 F (36.4 C)  SpO2: 98%  Weight: 136 lb 12.8 oz (62.1 kg)  Height: 5' 4.75" (1.645 m)  PainSc: 5   PainLoc: Head   Body mass index is 22.94 kg/m.  General appearance: alert, no distress, WD/WN, female HEENT: + frontal tenderness, + temple tenderness, + TMJ tenderness left side, normocephalic, sclerae anicteric, TMs pearly, nares patent, no discharge or erythema, pharynx normal Oral cavity: MMM, no lesions Neck: supple, no lymphadenopathy, no thyromegaly, no masses Heart: RRR, normal S1, S2, no murmurs Lungs: CTA bilaterally, no wheezes, rhonchi, or rales Abdomen: +bs, soft, non tender, non  distended, no masses, no hepatomegaly, no splenomegaly Musculoskeletal: nontender, no swelling, no obvious deformity Extremities: no edema, no cyanosis, no clubbing Pulses: 2+ symmetric, upper and lower extremities, normal cap refill Neurological: alert, oriented x 3, CN2-12 intact, strength normal upper extremities and lower extremities, sensation normal throughout, DTRs 2+ throughout, no cerebellar signs, gait normal Psychiatric: normal affect, behavior normal, pleasant   Medicare Attestation  I have personally reviewed: The patient's medical and social history Their use of alcohol, tobacco or illicit drugs Their current medications and supplements The patient's functional ability including ADLs,fall risks, home safety risks, cognitive, and hearing and visual impairment Diet and physical activities Evidence for depression or mood disorders  The patient's weight, height, BMI, and visual acuity have been recorded in the chart.  I have made referrals, counseling, and provided education to the patient based on review of the above and I have provided the patient with a written personalized care plan for preventive services.     Brandi Mutters, PA-C   06/09/2017

## 2017-06-16 DIAGNOSIS — N644 Mastodynia: Secondary | ICD-10-CM | POA: Insufficient documentation

## 2017-06-17 ENCOUNTER — Encounter: Payer: Self-pay | Admitting: Cardiology

## 2017-06-17 ENCOUNTER — Other Ambulatory Visit: Payer: Self-pay | Admitting: Cardiology

## 2017-06-17 ENCOUNTER — Ambulatory Visit (INDEPENDENT_AMBULATORY_CARE_PROVIDER_SITE_OTHER): Payer: Medicare Other | Admitting: Cardiology

## 2017-06-17 DIAGNOSIS — G629 Polyneuropathy, unspecified: Secondary | ICD-10-CM | POA: Diagnosis not present

## 2017-06-17 DIAGNOSIS — R55 Syncope and collapse: Secondary | ICD-10-CM | POA: Diagnosis not present

## 2017-06-17 DIAGNOSIS — Z1212 Encounter for screening for malignant neoplasm of rectum: Principal | ICD-10-CM

## 2017-06-17 DIAGNOSIS — Z79891 Long term (current) use of opiate analgesic: Secondary | ICD-10-CM | POA: Diagnosis not present

## 2017-06-17 DIAGNOSIS — M542 Cervicalgia: Secondary | ICD-10-CM | POA: Diagnosis not present

## 2017-06-17 DIAGNOSIS — Z79899 Other long term (current) drug therapy: Secondary | ICD-10-CM | POA: Diagnosis not present

## 2017-06-17 DIAGNOSIS — M549 Dorsalgia, unspecified: Secondary | ICD-10-CM | POA: Diagnosis not present

## 2017-06-17 DIAGNOSIS — Z1211 Encounter for screening for malignant neoplasm of colon: Secondary | ICD-10-CM

## 2017-06-17 DIAGNOSIS — G894 Chronic pain syndrome: Secondary | ICD-10-CM | POA: Diagnosis not present

## 2017-06-17 HISTORY — DX: Syncope and collapse: R55

## 2017-06-17 NOTE — Patient Instructions (Signed)
Medication Instructions:  Your physician recommends that you continue on your current medications as directed. Please refer to the Current Medication list given to you today.  Labwork: None  Testing/Procedures: Your physician has requested that you have a stress echocardiogram. For further information please visit [email or web address]. Please follow instruction sheet as given.  Your physician has recommended that you wear an event monitor. Event monitors are medical devices that record the heart's electrical activity. Doctors most often [place] these monitors to diagnose arrhythmias. Arrhythmias are problems with the speed or rhythm of the heartbeat. The monitor is a small, portable device. You can wear one while you do your normal daily activities. This is usually used to diagnose what is causing palpitations/syncope (passing out). 30 days.  Follow-Up: Your physician wants you to follow-up in: 3 months. You will receive a reminder letter in the mail two months in advance. If you don't receive a letter, please call our office to schedule the follow-up appointment.  Any Other Special Instructions Will Be Listed Below (If Applicable).     If you need a refill on your cardiac medications before your next appointment, please call your pharmacy.

## 2017-06-17 NOTE — Progress Notes (Signed)
Cardiology Office Note:    Date:  06/17/2017   ID:  Brandi Erickson, DOB 02-18-1971, MRN [id number]  PCP:  Unk Pinto, MD  Cardiologist:  Jenean Lindau, MD   Referring MD: Vicie Mutters, PA-C    ASSESSMENT:    1. Syncope, unspecified syncope type    PLAN:    In order of problems listed above:  1. I discussed my findings with the patient at extensive length.  Overall examination is unremarkable.  I told her to be careful about falls and not hurting herself.  She was advised not to drive especially in view of syncopal episodes.  I told her to increase salt and fluid intake and I thought this might help with her blood pressure issues.  She might be learning running low blood pressures.  She will have an exercise stress echo to evaluate her exercise tolerance and also it will give me an assessment of basic cardiac anatomy.  She will have an event monitor to see for any bradycardia or tachyarrhythmias. 2. He will be seen in follow-up appointment in the month of June or earlier if she has any concerns.   Medication Adjustments/Labs and Tests Ordered: Current medicines are reviewed at length with the patient today.  Concerns regarding medicines are outlined above.  Orders Placed This Encounter  Procedures  . CARDIAC EVENT MONITOR  . ECHOCARDIOGRAM STRESS TEST   No orders of the defined types were placed in this encounter.    History of Present Illness:    Brandi Erickson is a 47 y.o. female who is being seen today for the evaluation of syncope at the request of Vicie Mutters, [place].  Patient is a pleasant 47 year old female.  She has past medical history of Charcot Lelan Pons tooth disease.  She tells me that she has passed out on a couple of occasions and she gets warning with this.  No orthopnea or PND no chest pain she is an active lady and exercises on a regular basis and walks about 30 minutes.  She occasionally has some shortness of breath on exertion.  At the time of my  evaluation, the patient is alert awake oriented and in no distress.  Past Medical History:  Diagnosis Date  . Allergy   . Asthma   . Chronic constipation   . CMT (Charcot-Marie-Tooth disease)   . GERD (gastroesophageal reflux disease)   . 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 Seasonal allergies   . Uterine polyp   . Wears glasses     Past Surgical History:  Procedure Laterality Date  . COLONOSCOPY N/A 11/01/2012   Procedure: COLONOSCOPY;  Surgeon: Rogene Houston, MD;  Location: [facility];  Service: Endoscopy;  Laterality: N/A;  130  . DILATION AND CURETTAGE OF UTERUS    . ESOPHAGEAL MANOMETRY  05/17/2011   Procedure: ESOPHAGEAL MANOMETRY (EM);  Surgeon: Beryle Beams, MD;  Location: [facility];  Service: Endoscopy;  Laterality: N/A;  . FOOT NEUROMA SURGERY Right 06-05-2013  . HYSTEROSCOPY W/D&C N/A 06/27/2013   Procedure: HYSTEROSCOPY POLPYECTOMY  ;  Surgeon: Governor Specking, MD;  Location: [facility];  Service: Gynecology;  Laterality: N/A;  . LAPAROSCOPY N/A 06/27/2013   Procedure: LAPAROSCOPY WITH extensive lysis of adhesions;  Surgeon: Governor Specking, MD;  Location: [place];  Service: Gynecology;  Laterality: N/A;  . TONSILLECTOMY    . TONSILLECTOMY  as child  . TUBAL LIGATION  1999  . tubal reconstruction  1997    Current Medications: Current Meds  Medication Sig  . albuterol (PROAIR HFA) 108 (90 Base) MCG/ACT inhaler Inhale 2 puffs into the lungs every 6 (six) hours as needed for wheezing or shortness of breath.  Marland Kitchen aspirin EC 81 MG tablet Take 81 mg by mouth daily.  . cetirizine (ZYRTEC) 10 MG tablet Take 10 mg by mouth every evening.  . fluticasone (CUTIVATE) 0.05 % cream Apply 1 application topically 2 (two) times daily.   . Fluticasone-Salmeterol (ADVAIR DISKUS) 250-50 MCG/DOSE AEPB Inhale 1 puff into the lungs every 12 (twelve) hours.  . gabapentin (NEURONTIN) 800  MG tablet Take 800 mg by mouth 3 (three) times daily.  . methocarbamol (ROBAXIN) 500 MG tablet Take 1 tablet (500 mg total) by mouth 3 (three) times daily as needed for muscle spasms.  . montelukast (SINGULAIR) 10 MG tablet Take 1 tablet (10 mg total) by mouth at bedtime.  . Olopatadine HCl (PATADAY) 0.2 % SOLN Apply to each eye as needed  . omeprazole (PRILOSEC) 40 MG capsule Take 1 capsule (40 mg total) by mouth daily.  Marland Kitchen oxyCODONE-acetaminophen (PERCOCET/ROXICET) 5-325 MG tablet Take 1 tablet by mouth 2 (two) times daily.  . polyethylene glycol (MIRALAX / GLYCOLAX) packet Take 17 g by mouth daily.  Marland Kitchen senna (SENOKOT) 8.6 MG tablet Take 1 tablet by mouth every other day.  . venlafaxine XR (EFFEXOR XR) 37.5 MG 24 hr capsule 1 tab for 1-2 weeks and then go up to 2 in AM.  . Vitamin D, Ergocalciferol, (DRISDOL) 50000 units CAPS capsule Take 1 capsule (50,000 Units total) by mouth every 3 (three) days.  . [DISCONTINUED] gabapentin (NEURONTIN) 600 MG tablet Take 1 tablet (600 mg total) by mouth 3 (three) times daily.     Allergies:   Linzess [linaclotide]; Iodinated diagnostic agents; Oxycontin [oxycodone hcl]; Penicillins; Shellfish allergy; Sulfa antibiotics; and Topamax [topiramate]   Social History   Socioeconomic History  . Marital status: Married    Spouse name: Not on file  . Number of children: Not on file  . Years of education: Not on file  . Highest education level: Not on file  Occupational History  . Not on file  Social Needs  . Financial resource strain: Not on file  . Food insecurity:    Worry: Not on file    Inability: Not on file  . Transportation needs:    Medical: Not on file    Non-medical: Not on file  Tobacco Use  . Smoking status: Never Smoker  . Smokeless tobacco: Never Used  Substance and Sexual Activity  . Alcohol use: No  . Drug use: No  . Sexual activity: Yes  Lifestyle  . Physical activity:    Days per week: Not on file    Minutes per session: Not on  file  . Stress: Not on file  Relationships  . Social connections:    Talks on phone: Not on file    Gets together: Not on file    Attends religious service: Not on file    Active member of club or organization: Not on file    Attends meetings of clubs or organizations: Not on file    Relationship status: Not on file  Other Topics Concern  . Not on file  Social History Narrative   ** Merged History Encounter **         Family History: The patient's family history includes Brain cancer in her sister; Breast cancer  in her sister.  ROS:   Please see the history of present illness.    All other systems reviewed and are negative.  EKGs/Labs/Other Studies Reviewed:    The following studies were reviewed today: I discussed the findings from the reports I have available to me with the patient.   Recent Labs: 05/25/2017: ALT 12; BUN 8; Creat 0.76; Hemoglobin 13.0; Magnesium 2.0; Platelets 353; Potassium 4.2; Sodium 139; TSH 1.13  Recent Lipid Panel    Component Value Date/Time   CHOL 211 (H) 05/25/2017 1559   TRIG 63 05/25/2017 1559   HDL 66 05/25/2017 1559   CHOLHDL 3.2 05/25/2017 1559   VLDL 16 09/16/2016 0919   LDLCALC 130 (H) 05/25/2017 1559    Physical Exam:    VS:  BP 114/78 (BP Location: Right Arm, Patient Position: Sitting, Cuff Size: Normal)   Pulse (!) 58   Ht 5' 4.75" (1.645 m)   Wt 132 lb 12.8 oz (60.2 kg)   SpO2 99%   BMI 22.27 kg/m     Wt Readings from Last 3 Encounters:  06/17/17 132 lb 12.8 oz (60.2 kg)  06/09/17 136 lb 12.8 oz (62.1 kg)  05/25/17 132 lb 9.6 oz (60.1 kg)     GEN: Patient is in no acute distress HEENT: Normal NECK: No JVD; No carotid bruits LYMPHATICS: No lymphadenopathy CARDIAC: S1 S2 regular, 2/6 systolic murmur at the apex. RESPIRATORY:  Clear to auscultation without rales, wheezing or rhonchi  ABDOMEN: Soft, non-tender, non-distended MUSCULOSKELETAL:  No edema; No deformity  SKIN: Warm and dry NEUROLOGIC:  Alert and oriented  x 3 PSYCHIATRIC:  Normal affect    Signed, Jenean Lindau, MD  06/17/2017 12:02 PM    [place]

## 2017-06-24 DIAGNOSIS — J4520 Mild intermittent asthma, uncomplicated: Secondary | ICD-10-CM | POA: Diagnosis not present

## 2017-06-28 DIAGNOSIS — J3081 Allergic rhinitis due to animal (cat) (dog) hair and dander: Secondary | ICD-10-CM | POA: Diagnosis not present

## 2017-06-28 DIAGNOSIS — J301 Allergic rhinitis due to pollen: Secondary | ICD-10-CM | POA: Diagnosis not present

## 2017-06-28 DIAGNOSIS — J3089 Other allergic rhinitis: Secondary | ICD-10-CM | POA: Diagnosis not present

## 2017-06-30 DIAGNOSIS — J301 Allergic rhinitis due to pollen: Secondary | ICD-10-CM | POA: Diagnosis not present

## 2017-06-30 DIAGNOSIS — J3089 Other allergic rhinitis: Secondary | ICD-10-CM | POA: Diagnosis not present

## 2017-06-30 DIAGNOSIS — J3081 Allergic rhinitis due to animal (cat) (dog) hair and dander: Secondary | ICD-10-CM | POA: Diagnosis not present

## 2017-07-04 DIAGNOSIS — J301 Allergic rhinitis due to pollen: Secondary | ICD-10-CM | POA: Diagnosis not present

## 2017-07-04 DIAGNOSIS — J3089 Other allergic rhinitis: Secondary | ICD-10-CM | POA: Diagnosis not present

## 2017-07-04 DIAGNOSIS — J3081 Allergic rhinitis due to animal (cat) (dog) hair and dander: Secondary | ICD-10-CM | POA: Diagnosis not present

## 2017-07-05 DIAGNOSIS — H903 Sensorineural hearing loss, bilateral: Secondary | ICD-10-CM | POA: Insufficient documentation

## 2017-07-05 DIAGNOSIS — H6983 Other specified disorders of Eustachian tube, bilateral: Secondary | ICD-10-CM | POA: Diagnosis not present

## 2017-07-05 DIAGNOSIS — H9313 Tinnitus, bilateral: Secondary | ICD-10-CM | POA: Diagnosis not present

## 2017-07-05 DIAGNOSIS — H905 Unspecified sensorineural hearing loss: Secondary | ICD-10-CM | POA: Diagnosis not present

## 2017-07-05 DIAGNOSIS — H9193 Unspecified hearing loss, bilateral: Secondary | ICD-10-CM | POA: Diagnosis not present

## 2017-07-05 HISTORY — DX: Tinnitus, bilateral: H93.13

## 2017-07-05 HISTORY — DX: Sensorineural hearing loss, bilateral: H90.3

## 2017-07-06 DIAGNOSIS — J3089 Other allergic rhinitis: Secondary | ICD-10-CM | POA: Diagnosis not present

## 2017-07-06 DIAGNOSIS — J301 Allergic rhinitis due to pollen: Secondary | ICD-10-CM | POA: Diagnosis not present

## 2017-07-06 DIAGNOSIS — J3081 Allergic rhinitis due to animal (cat) (dog) hair and dander: Secondary | ICD-10-CM | POA: Diagnosis not present

## 2017-07-12 DIAGNOSIS — J3089 Other allergic rhinitis: Secondary | ICD-10-CM | POA: Diagnosis not present

## 2017-07-12 DIAGNOSIS — J3081 Allergic rhinitis due to animal (cat) (dog) hair and dander: Secondary | ICD-10-CM | POA: Diagnosis not present

## 2017-07-12 DIAGNOSIS — J301 Allergic rhinitis due to pollen: Secondary | ICD-10-CM | POA: Diagnosis not present

## 2017-07-13 DIAGNOSIS — H02052 Trichiasis without entropian right lower eyelid: Secondary | ICD-10-CM | POA: Diagnosis not present

## 2017-07-13 DIAGNOSIS — H31091 Other chorioretinal scars, right eye: Secondary | ICD-10-CM | POA: Diagnosis not present

## 2017-07-13 DIAGNOSIS — H04123 Dry eye syndrome of bilateral lacrimal glands: Secondary | ICD-10-CM | POA: Diagnosis not present

## 2017-07-14 ENCOUNTER — Ambulatory Visit (HOSPITAL_BASED_OUTPATIENT_CLINIC_OR_DEPARTMENT_OTHER)
Admission: RE | Admit: 2017-07-14 | Discharge: 2017-07-14 | Disposition: A | Discharge status: Home / Self Care | Payer: Medicare Other | Source: Ambulatory Visit | Attending: Internal Medicine | Admitting: Internal Medicine

## 2017-07-14 ENCOUNTER — Ambulatory Visit: Payer: Medicare Other

## 2017-07-14 DIAGNOSIS — R55 Syncope and collapse: Secondary | ICD-10-CM | POA: Insufficient documentation

## 2017-07-14 DIAGNOSIS — E785 Hyperlipidemia, unspecified: Secondary | ICD-10-CM | POA: Insufficient documentation

## 2017-07-15 DIAGNOSIS — Z79899 Other long term (current) drug therapy: Secondary | ICD-10-CM | POA: Diagnosis not present

## 2017-07-15 DIAGNOSIS — M5136 Other intervertebral disc degeneration, lumbar region: Secondary | ICD-10-CM | POA: Diagnosis not present

## 2017-07-15 DIAGNOSIS — R262 Difficulty in walking, not elsewhere classified: Secondary | ICD-10-CM | POA: Diagnosis not present

## 2017-07-15 DIAGNOSIS — M5030 Other cervical disc degeneration, unspecified cervical region: Secondary | ICD-10-CM | POA: Diagnosis not present

## 2017-07-16 DIAGNOSIS — J45901 Unspecified asthma with (acute) exacerbation: Secondary | ICD-10-CM | POA: Diagnosis not present

## 2017-07-16 DIAGNOSIS — J3089 Other allergic rhinitis: Secondary | ICD-10-CM | POA: Diagnosis not present

## 2017-07-16 DIAGNOSIS — B9689 Other specified bacterial agents as the cause of diseases classified elsewhere: Secondary | ICD-10-CM | POA: Diagnosis not present

## 2017-07-16 DIAGNOSIS — J019 Acute sinusitis, unspecified: Secondary | ICD-10-CM | POA: Diagnosis not present

## 2017-07-17 NOTE — Progress Notes (Deleted)
   Subjective:    Patient ID: Brandi Erickson, female    DOB: 12/31/1970, 47 y.o.   MRN: [id number]  HPI 47 y.o. AA female with history of has GERD; Asthma, chronic; CMT (Charcot-Marie-Tooth disease); Constipation; Other fatigue; Vitamin D deficiency; Pain in joint, multiple sites; Medication management; Mixed hyperlipidemia; Prediabetes; Difficulty swallowing; Pain of breast; and Syncope on their problem list. presents for fever.   There were no vitals taken for this visit.  Medications Current Outpatient Medications on File Prior to Visit  Medication Sig  . albuterol (PROAIR HFA) 108 (90 Base) MCG/ACT inhaler Inhale 2 puffs into the lungs every 6 (six) hours as needed for wheezing or shortness of breath.  Marland Kitchen aspirin EC 81 MG tablet Take 81 mg by mouth daily.  . cetirizine (ZYRTEC) 10 MG tablet Take 10 mg by mouth every evening.  . fluticasone (CUTIVATE) 0.05 % cream Apply 1 application topically 2 (two) times daily.   . Fluticasone-Salmeterol (ADVAIR DISKUS) 250-50 MCG/DOSE AEPB Inhale 1 puff into the lungs every 12 (twelve) hours.  . gabapentin (NEURONTIN) 800 MG tablet Take 800 mg by mouth 3 (three) times daily.  . methocarbamol (ROBAXIN) 500 MG tablet Take 1 tablet (500 mg total) by mouth 3 (three) times daily as needed for muscle spasms.  . montelukast (SINGULAIR) 10 MG tablet Take 1 tablet (10 mg total) by mouth at bedtime.  . Olopatadine HCl (PATADAY) 0.2 % SOLN Apply to each eye as needed  . omeprazole (PRILOSEC) 40 MG capsule Take 1 capsule (40 mg total) by mouth daily.  Marland Kitchen oxyCODONE-acetaminophen (PERCOCET/ROXICET) 5-325 MG tablet Take 1 tablet by mouth 2 (two) times daily.  . polyethylene glycol (MIRALAX / GLYCOLAX) packet Take 17 g by mouth daily.  Marland Kitchen senna (SENOKOT) 8.6 MG tablet Take 1 tablet by mouth every other day.  . venlafaxine XR (EFFEXOR XR) 37.5 MG 24 hr capsule 1 tab for 1-2 weeks and then go up to 2 in AM.  . Vitamin D, Ergocalciferol, (DRISDOL) 50000 units CAPS capsule  Take 1 capsule (50,000 Units total) by mouth every 3 (three) days.   No current facility-administered medications on file prior to visit.     Problem list She has GERD; Asthma, chronic; CMT (Charcot-Marie-Tooth disease); Constipation; Other fatigue; Vitamin D deficiency; Pain in joint, multiple sites; Medication management; Mixed hyperlipidemia; Prediabetes; Difficulty swallowing; Pain of breast; and Syncope on their problem list.   Review of Systems     Objective:   Physical Exam        Assessment & Plan:

## 2017-07-18 ENCOUNTER — Ambulatory Visit: Payer: Self-pay | Admitting: Physician Assistant

## 2017-07-19 ENCOUNTER — Ambulatory Visit (INDEPENDENT_AMBULATORY_CARE_PROVIDER_SITE_OTHER): Payer: Medicare Other | Admitting: Licensed Clinical Social Worker

## 2017-07-19 DIAGNOSIS — J3081 Allergic rhinitis due to animal (cat) (dog) hair and dander: Secondary | ICD-10-CM | POA: Diagnosis not present

## 2017-07-19 DIAGNOSIS — J301 Allergic rhinitis due to pollen: Secondary | ICD-10-CM | POA: Diagnosis not present

## 2017-07-19 DIAGNOSIS — F39 Unspecified mood [affective] disorder: Secondary | ICD-10-CM

## 2017-07-19 DIAGNOSIS — J3089 Other allergic rhinitis: Secondary | ICD-10-CM | POA: Diagnosis not present

## 2017-07-22 DIAGNOSIS — G629 Polyneuropathy, unspecified: Secondary | ICD-10-CM | POA: Diagnosis not present

## 2017-07-22 DIAGNOSIS — J301 Allergic rhinitis due to pollen: Secondary | ICD-10-CM | POA: Diagnosis not present

## 2017-07-22 DIAGNOSIS — G894 Chronic pain syndrome: Secondary | ICD-10-CM | POA: Diagnosis not present

## 2017-07-22 DIAGNOSIS — J3081 Allergic rhinitis due to animal (cat) (dog) hair and dander: Secondary | ICD-10-CM | POA: Diagnosis not present

## 2017-07-22 DIAGNOSIS — M542 Cervicalgia: Secondary | ICD-10-CM | POA: Diagnosis not present

## 2017-07-22 DIAGNOSIS — J3089 Other allergic rhinitis: Secondary | ICD-10-CM | POA: Diagnosis not present

## 2017-07-27 DIAGNOSIS — J3081 Allergic rhinitis due to animal (cat) (dog) hair and dander: Secondary | ICD-10-CM | POA: Diagnosis not present

## 2017-07-27 DIAGNOSIS — J301 Allergic rhinitis due to pollen: Secondary | ICD-10-CM | POA: Diagnosis not present

## 2017-07-27 DIAGNOSIS — J3089 Other allergic rhinitis: Secondary | ICD-10-CM | POA: Diagnosis not present

## 2017-07-29 DIAGNOSIS — J301 Allergic rhinitis due to pollen: Secondary | ICD-10-CM | POA: Diagnosis not present

## 2017-08-01 NOTE — Progress Notes (Signed)
   THERAPIST PROGRESS NOTE  Session Time: 36 MIN  Participation Level: Active  Behavioral Response: CasualAlertEuthymic  Type of Therapy: Individual Therapy  Treatment Goals addressed: Coping  Interventions: Supportive  Summary: Brandi Erickson is a 47 y.o. female who presents with  Symptoms of her diagnosis.  Therapist provided examples as to how past trauma may presently be declared in her life. Therapist explored therapeutic ways to manage her symptoms.    Suicidal/Homicidal: No  Plan: Return again in 2 weeks.  Diagnosis: Axis I: Mood Disorder NOS    Axis II: No diagnosis    Lubertha South, LCSW 07/19/2017

## 2017-08-02 DIAGNOSIS — J3081 Allergic rhinitis due to animal (cat) (dog) hair and dander: Secondary | ICD-10-CM | POA: Diagnosis not present

## 2017-08-02 DIAGNOSIS — J3089 Other allergic rhinitis: Secondary | ICD-10-CM | POA: Diagnosis not present

## 2017-08-02 DIAGNOSIS — J301 Allergic rhinitis due to pollen: Secondary | ICD-10-CM | POA: Diagnosis not present

## 2017-08-04 DIAGNOSIS — J3089 Other allergic rhinitis: Secondary | ICD-10-CM | POA: Diagnosis not present

## 2017-08-04 DIAGNOSIS — J3081 Allergic rhinitis due to animal (cat) (dog) hair and dander: Secondary | ICD-10-CM | POA: Diagnosis not present

## 2017-08-04 DIAGNOSIS — J301 Allergic rhinitis due to pollen: Secondary | ICD-10-CM | POA: Diagnosis not present

## 2017-08-08 ENCOUNTER — Ambulatory Visit (INDEPENDENT_AMBULATORY_CARE_PROVIDER_SITE_OTHER): Payer: Medicare Other | Admitting: Internal Medicine

## 2017-08-08 ENCOUNTER — Encounter (INDEPENDENT_AMBULATORY_CARE_PROVIDER_SITE_OTHER): Payer: Self-pay | Admitting: Internal Medicine

## 2017-08-08 VITALS — BP 110/70 | HR 60 | Temp 97.8°F | Ht 66.0 in | Wt 136.9 lb

## 2017-08-08 DIAGNOSIS — K5909 Other constipation: Secondary | ICD-10-CM | POA: Diagnosis not present

## 2017-08-08 NOTE — Progress Notes (Signed)
Subjective:    Patient ID: Brandi Erickson, female    DOB: 1970/10/31, 47 y.o.   MRN: [id number]  HPI Here today for f/u. Last seen in May of 2018. Hx of chronic constipation.  She takes Fiber and Magnesium for her constipation. Also takes Miralax and Senna.  She has tried Linzess but it cramped her abdomen.  She tells me she is doing good. She says she is going thru a divorce.  She was married x 4 years. Her appetite is good. She has lost from 156 to 136.9. She says she had partial hysterectomy. BMs are normal.  No melena or BRRB.  Hx of Charcot Bo Merino disease and is maintained on narcotics for this.    Review of Systems Past Medical History:  Diagnosis Date  . Allergy   . Asthma   . Chronic constipation   . CMT (Charcot-Marie-Tooth disease)   . GERD (gastroesophageal reflux disease)   . 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 Seasonal allergies   . Uterine polyp   . Wears glasses     Past Surgical History:  Procedure Laterality Date  . COLONOSCOPY N/A 11/01/2012   Procedure: COLONOSCOPY;  Surgeon: Rogene Houston, MD;  Location: [facility];  Service: Endoscopy;  Laterality: N/A;  130  . DILATION AND CURETTAGE OF UTERUS    . ESOPHAGEAL MANOMETRY  05/17/2011   Procedure: ESOPHAGEAL MANOMETRY (EM);  Surgeon: Beryle Beams, MD;  Location: [facility];  Service: Endoscopy;  Laterality: N/A;  . FOOT NEUROMA SURGERY Right 06-05-2013  . HYSTEROSCOPY W/D&C N/A 06/27/2013   Procedure: HYSTEROSCOPY POLPYECTOMY  ;  Surgeon: Governor Specking, MD;  Location: [facility];  Service: Gynecology;  Laterality: N/A;  . LAPAROSCOPY N/A 06/27/2013   Procedure: LAPAROSCOPY WITH extensive lysis of adhesions;  Surgeon: Governor Specking, MD;  Location: [place];  Service: Gynecology;  Laterality: N/A;  . TONSILLECTOMY    . TONSILLECTOMY  as child  . TUBAL LIGATION  1999  . tubal reconstruction  1997     Allergies  Allergen Reactions  . Linzess [Linaclotide] Rash  . Iodinated Diagnostic Agents Nausea And Vomiting    MRI dye  nausea  . Oxycontin [Oxycodone Hcl] Nausea And Vomiting  . Penicillins Hives  . Shellfish Allergy Other (See Comments)  . Sulfa Antibiotics Hives  . Topamax [Topiramate] Hives    Current Outpatient Medications on File Prior to Visit  Medication Sig Dispense Refill  . albuterol (PROAIR HFA) 108 (90 Base) MCG/ACT inhaler Inhale 2 puffs into the lungs every 6 (six) hours as needed for wheezing or shortness of breath. 3 Inhaler 4  . aspirin EC 81 MG tablet Take 81 mg by mouth daily.    . cetirizine (ZYRTEC) 10 MG tablet Take 10 mg by mouth every evening.    . fluticasone (CUTIVATE) 0.05 % cream Apply 1 application topically 2 (two) times daily.     . Fluticasone-Salmeterol (ADVAIR DISKUS) 250-50 MCG/DOSE AEPB Inhale 1 puff into the lungs every 12 (twelve) hours. 180 each 3  . gabapentin (NEURONTIN) 800 MG tablet Take 800 mg by mouth 3 (three) times daily.    . methocarbamol (ROBAXIN) 500 MG tablet Take 1 tablet (500 mg total) by mouth 3 (three) times daily as needed for muscle spasms. 270 tablet 1  . montelukast (SINGULAIR) 10 MG tablet Take 1 tablet (10 mg total) by mouth at bedtime. 90 tablet 1  .  Olopatadine HCl (PATADAY) 0.2 % SOLN Apply to each eye as needed 3 Bottle 3  . omeprazole (PRILOSEC) 40 MG capsule Take 1 capsule (40 mg total) by mouth daily. 90 capsule 3  . oxyCODONE-acetaminophen (PERCOCET/ROXICET) 5-325 MG tablet Take 1 tablet by mouth 2 (two) times daily.    . polyethylene glycol (MIRALAX / GLYCOLAX) packet Take 17 g by mouth daily.    Marland Kitchen senna (SENOKOT) 8.6 MG tablet Take 1 tablet by mouth every other day.    . venlafaxine XR (EFFEXOR XR) 37.5 MG 24 hr capsule 1 tab for 1-2 weeks and then go up to 2 in AM. 60 capsule 1  . Vitamin D, Ergocalciferol, (DRISDOL) 50000 units CAPS capsule Take 1 capsule (50,000 Units total) by mouth every 3 (three)  days. 90 capsule 0   No current facility-administered medications on file prior to visit.         Objective:   Physical Exam  Blood pressure 110/70, pulse 60, temperature 97.8 F (36.6 C), height 5\' 6"  (1.676 m), weight 136 lb 14.4 oz (62.1 kg). Alert and oriented. Skin warm and dry. Oral mucosa is moist.   . Sclera anicteric, conjunctivae is pink. Thyroid not enlarged. No cervical lymphadenopathy. Lungs clear. Heart regular rate and rhythm.  Abdomen is soft. Bowel sounds are positive. No hepatomegaly. No abdominal masses felt. No tenderness.  No edema to lower extremities.            Assessment & Plan:  Constipation. Continue the Fiber, Miralax, and senna. OV in 1 year.

## 2017-08-08 NOTE — Patient Instructions (Signed)
OV in 1 year.  

## 2017-08-10 DIAGNOSIS — J3081 Allergic rhinitis due to animal (cat) (dog) hair and dander: Secondary | ICD-10-CM | POA: Diagnosis not present

## 2017-08-10 DIAGNOSIS — J301 Allergic rhinitis due to pollen: Secondary | ICD-10-CM | POA: Diagnosis not present

## 2017-08-10 DIAGNOSIS — J3089 Other allergic rhinitis: Secondary | ICD-10-CM | POA: Diagnosis not present

## 2017-08-17 DIAGNOSIS — J301 Allergic rhinitis due to pollen: Secondary | ICD-10-CM | POA: Diagnosis not present

## 2017-08-17 DIAGNOSIS — J3089 Other allergic rhinitis: Secondary | ICD-10-CM | POA: Diagnosis not present

## 2017-08-17 DIAGNOSIS — J3081 Allergic rhinitis due to animal (cat) (dog) hair and dander: Secondary | ICD-10-CM | POA: Diagnosis not present

## 2017-08-18 ENCOUNTER — Telehealth: Payer: Self-pay

## 2017-08-18 ENCOUNTER — Ambulatory Visit (INDEPENDENT_AMBULATORY_CARE_PROVIDER_SITE_OTHER): Payer: Medicare Other | Admitting: Licensed Clinical Social Worker

## 2017-08-18 DIAGNOSIS — F39 Unspecified mood [affective] disorder: Secondary | ICD-10-CM | POA: Diagnosis not present

## 2017-08-18 NOTE — Telephone Encounter (Signed)
Patient was notified of results.  

## 2017-08-19 DIAGNOSIS — Z79891 Long term (current) use of opiate analgesic: Secondary | ICD-10-CM | POA: Diagnosis not present

## 2017-08-19 DIAGNOSIS — G894 Chronic pain syndrome: Secondary | ICD-10-CM | POA: Diagnosis not present

## 2017-08-19 DIAGNOSIS — M542 Cervicalgia: Secondary | ICD-10-CM | POA: Diagnosis not present

## 2017-08-19 DIAGNOSIS — M549 Dorsalgia, unspecified: Secondary | ICD-10-CM | POA: Diagnosis not present

## 2017-08-19 DIAGNOSIS — Z79899 Other long term (current) drug therapy: Secondary | ICD-10-CM | POA: Diagnosis not present

## 2017-08-24 DIAGNOSIS — J3081 Allergic rhinitis due to animal (cat) (dog) hair and dander: Secondary | ICD-10-CM | POA: Diagnosis not present

## 2017-08-24 DIAGNOSIS — J301 Allergic rhinitis due to pollen: Secondary | ICD-10-CM | POA: Diagnosis not present

## 2017-08-24 DIAGNOSIS — J3089 Other allergic rhinitis: Secondary | ICD-10-CM | POA: Diagnosis not present

## 2017-08-25 ENCOUNTER — Other Ambulatory Visit: Payer: Self-pay | Admitting: Internal Medicine

## 2017-08-25 DIAGNOSIS — Z1231 Encounter for screening mammogram for malignant neoplasm of breast: Secondary | ICD-10-CM

## 2017-08-31 DIAGNOSIS — J301 Allergic rhinitis due to pollen: Secondary | ICD-10-CM | POA: Diagnosis not present

## 2017-08-31 DIAGNOSIS — J3089 Other allergic rhinitis: Secondary | ICD-10-CM | POA: Diagnosis not present

## 2017-08-31 DIAGNOSIS — J3081 Allergic rhinitis due to animal (cat) (dog) hair and dander: Secondary | ICD-10-CM | POA: Diagnosis not present

## 2017-09-09 DIAGNOSIS — J3089 Other allergic rhinitis: Secondary | ICD-10-CM | POA: Diagnosis not present

## 2017-09-09 DIAGNOSIS — J301 Allergic rhinitis due to pollen: Secondary | ICD-10-CM | POA: Diagnosis not present

## 2017-09-14 DIAGNOSIS — F419 Anxiety disorder, unspecified: Secondary | ICD-10-CM | POA: Insufficient documentation

## 2017-09-14 HISTORY — DX: Anxiety disorder, unspecified: F41.9

## 2017-09-14 NOTE — Progress Notes (Signed)
FOLLOW UP  Assessment and Plan:   BP Off of medications Monitor blood pressure at home; patient to call if consistently greater than 130/80 Continue DASH diet.   Reminder to go to the ER if any CP, SOB, nausea, dizziness, severe HA, changes vision/speech, left arm numbness and tingling and jaw pain.  Cholesterol Currently above goal; treated by lifestyle for mild elevation - discussed at length Continue low cholesterol diet and exercise.  Check lipid panel.   Prediabetes A1C borderline Continue diet and exercise.  Perform daily foot/skin check, notify office of any concerning changes.  Check A1C  Bmi 22 Continue to recommend diet heavy in fruits and veggies and low in animal meats, cheeses, and dairy products, appropriate calorie intake Discuss exercise recommendations routinely Continue to monitor weight at each visit  Vitamin D Def At goal at last visit; continue supplementation to maintain goal of 70-100 Defer Vit D level  GERD Well managed on current medications Discussed diet, avoiding triggers and other lifestyle changes  Anxiety  Continue follow up with behavioral health, doing well on effexor 37.5 mg daily Well managed by current regimen; continue medications Stress management techniques discussed, increase water, good sleep hygiene discussed, increase exercise, and increase veggies.    Continue diet and meds as discussed. Further disposition pending results of labs. Discussed med's effects and SE's.   Over 30 minutes of exam, counseling, chart review, and critical decision making was performed.   Future Appointments  Date Time Provider [place]  09/20/2017 12:00 PM [facility] 3 [facility]  10/04/2017  8:00 AM Lubertha South, LCSW [facility] None  12/22/2017  9:30 AM Unk Pinto, MD [facility] None  06/19/2018 11:15 AM Vicie Mutters, PA-C [facility] None  07/04/2018  3:00 PM Unk Pinto, MD [facility] None     ----------------------------------------------------------------------------------------------------------------------  HPI 47 y.o. female  presents for 3 month follow up on BP, cholesterol, prediabetes, weight and vitamin D deficiency. Patient is on SS Disability for Chronic Pain Syndrome, diagnosied with Charcot Marie Tooth Disease in 25 (age 70)  and is followed at [facility]. She is also being seen by behavioral health for mild mood disorder for counseling.  No further syncopal episodes, neuro/cardiac workup were respectively unremarkable. Was instructed to increase salt and fluid intake.   She was started on effexor at last visit, briefly increased up to 2 tabs of 37.5 but feels 1 tab was effective and is continuing with this dose. Sleeps well, no panic attacks.   Asthma well controlled by current medications (singulair, advair discus) without recent flare.   she has a diagnosis of GERD which is currently managed by omeprazole 40 mg daily she reports symptoms is currently well controlled, and denies breakthrough reflux, burning in chest, hoarseness or cough.  She does well avoiding spicy foods that were causing breaking through.   BMI is Body mass index is 22.44 kg/m., she has been working on diet and exercise. Wt Readings from Last 3 Encounters:  09/15/17 139 lb (63 kg)  08/08/17 136 lb 14.4 oz (62.1 kg)  06/17/17 132 lb 12.8 oz (60.2 kg)   Today their BP is BP: 106/62  She does workout. She denies chest pain, shortness of breath, dizziness.   She is not on cholesterol medication and denies myalgias. Her cholesterol is not at goal. The cholesterol last visit was:   Lab Results  Component Value Date   CHOL 211 (H) 05/25/2017   HDL 66 05/25/2017   LDLCALC 130 (H) 05/25/2017  TRIG 63 05/25/2017   CHOLHDL 3.2 05/25/2017    She has been working on diet and exercise for prediabetes, and denies foot ulcerations, increased appetite, nausea, paresthesia of the  feet, polydipsia, polyuria, visual disturbances, vomiting and weight loss. Last A1C in the office was:  Lab Results  Component Value Date   HGBA1C 5.6 05/25/2017   Patient is on Vitamin D supplement.   Lab Results  Component Value Date   VD25OH 102 (H) 05/25/2017       Current Medications:  Current Outpatient Medications on File Prior to Visit  Medication Sig  . albuterol (PROAIR HFA) 108 (90 Base) MCG/ACT inhaler Inhale 2 puffs into the lungs every 6 (six) hours as needed for wheezing or shortness of breath.  Marland Kitchen aspirin EC 81 MG tablet Take 81 mg by mouth daily.  . cetirizine (ZYRTEC) 10 MG tablet Take 10 mg by mouth every evening.  . fluticasone (CUTIVATE) 0.05 % cream Apply 1 application topically 2 (two) times daily.   . Fluticasone-Salmeterol (ADVAIR DISKUS) 250-50 MCG/DOSE AEPB Inhale 1 puff into the lungs every 12 (twelve) hours.  . gabapentin (NEURONTIN) 800 MG tablet Take 800 mg by mouth 3 (three) times daily.  . methocarbamol (ROBAXIN) 500 MG tablet Take 1 tablet (500 mg total) by mouth 3 (three) times daily as needed for muscle spasms.  . montelukast (SINGULAIR) 10 MG tablet Take 1 tablet (10 mg total) by mouth at bedtime.  . Olopatadine HCl (PATADAY) 0.2 % SOLN Apply to each eye as needed  . omeprazole (PRILOSEC) 40 MG capsule Take 1 capsule (40 mg total) by mouth daily.  Marland Kitchen oxyCODONE-acetaminophen (PERCOCET/ROXICET) 5-325 MG tablet Take 1 tablet by mouth 2 (two) times daily.  . polyethylene glycol (MIRALAX / GLYCOLAX) packet Take 17 g by mouth daily.  Marland Kitchen senna (SENOKOT) 8.6 MG tablet Take 1 tablet by mouth every other day.  . venlafaxine XR (EFFEXOR XR) 37.5 MG 24 hr capsule 1 tab for 1-2 weeks and then go up to 2 in AM.  . Vitamin D, Ergocalciferol, (DRISDOL) 50000 units CAPS capsule Take 1 capsule (50,000 Units total) by mouth every 3 (three) days.   No current facility-administered medications on file prior to visit.      Allergies:  Allergies  Allergen Reactions  .  Linzess [Linaclotide] Rash  . Iodinated Diagnostic Agents Nausea And Vomiting    MRI dye  nausea  . Oxycontin [Oxycodone Hcl] Nausea And Vomiting  . Penicillins Hives  . Shellfish Allergy Other (See Comments)  . Sulfa Antibiotics Hives  . Topamax [Topiramate] Hives     Medical History:  Past Medical History:  Diagnosis Date  . Allergy   . Asthma   . Chronic constipation   . CMT (Charcot-Marie-Tooth disease)   . GERD (gastroesophageal reflux disease)   . 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 Seasonal allergies   . Uterine polyp   . Wears glasses    Family history- Reviewed and unchanged Social history- Reviewed and unchanged   Review of Systems:  Review of Systems  Constitutional: Negative for malaise/fatigue and weight loss.  HENT: Negative for hearing loss and tinnitus.   Eyes: Negative for blurred vision and double vision.  Respiratory: Negative for cough, shortness of breath and wheezing.   Cardiovascular: Negative for chest pain, palpitations, orthopnea, claudication and leg swelling.  Gastrointestinal: Positive for constipation. Negative for abdominal pain, blood in stool, diarrhea, heartburn, melena, nausea and vomiting.  Genitourinary: Negative.   Musculoskeletal: Negative for joint pain and myalgias.  Skin: Negative for rash.  Neurological: Positive for weakness (R/t CMT). Negative for dizziness, tingling, sensory change and headaches.  Endo/Heme/Allergies: Negative for polydipsia.  Psychiatric/Behavioral: Negative.  Negative for depression. The patient is not nervous/anxious and does not have insomnia.   All other systems reviewed and are negative.     Physical Exam: BP 106/62   Pulse (!) 58   Temp (!) 97.3 F (36.3 C)   Ht 5\' 6"  (1.676 m)   Wt 139 lb (63 kg)   LMP 02/13/2016   PF 98 L/min   BMI 22.44 kg/m  Wt Readings from Last 3 Encounters:  09/15/17 139 lb (63 kg)  08/08/17 136 lb  14.4 oz (62.1 kg)  06/17/17 132 lb 12.8 oz (60.2 kg)   General Appearance: Well nourished, in no apparent distress. Eyes: PERRLA, EOMs, conjunctiva no swelling or erythema Sinuses: No Frontal/maxillary tenderness ENT/Mouth: Ext aud canals clear, TMs without erythema, bulging. No erythema, swelling, or exudate on post pharynx.  Tonsils not swollen or erythematous. Hearing normal.  Neck: Supple, thyroid normal.  Respiratory: Respiratory effort normal, BS equal bilaterally without rales, rhonchi, wheezing or stridor.  Cardio: RRR with no MRGs. Brisk peripheral pulses without edema.  Abdomen: Soft, + BS.  Non tender, no guarding, rebound, hernias, masses. Lymphatics: Non tender without lymphadenopathy.  Musculoskeletal: Full ROM, 5/5 strength, Normal gait Skin: Warm, dry without rashes, lesions, ecchymosis.  Neuro: Cranial nerves intact. No cerebellar symptoms.  Psych: Awake and oriented X 3, normal affect, Insight and Judgment appropriate.    Izora Ribas, NP 10:51 AM [facility]

## 2017-09-15 ENCOUNTER — Ambulatory Visit (INDEPENDENT_AMBULATORY_CARE_PROVIDER_SITE_OTHER): Payer: Medicare Other | Admitting: Adult Health

## 2017-09-15 ENCOUNTER — Encounter: Payer: Self-pay | Admitting: Adult Health

## 2017-09-15 VITALS — BP 106/62 | HR 58 | Temp 97.3°F | Ht 66.0 in | Wt 139.0 lb

## 2017-09-15 DIAGNOSIS — R51 Headache: Secondary | ICD-10-CM | POA: Diagnosis not present

## 2017-09-15 DIAGNOSIS — R7303 Prediabetes: Secondary | ICD-10-CM

## 2017-09-15 DIAGNOSIS — J301 Allergic rhinitis due to pollen: Secondary | ICD-10-CM | POA: Diagnosis not present

## 2017-09-15 DIAGNOSIS — Z79899 Other long term (current) drug therapy: Secondary | ICD-10-CM

## 2017-09-15 DIAGNOSIS — E782 Mixed hyperlipidemia: Secondary | ICD-10-CM

## 2017-09-15 DIAGNOSIS — F419 Anxiety disorder, unspecified: Secondary | ICD-10-CM

## 2017-09-15 DIAGNOSIS — J45909 Unspecified asthma, uncomplicated: Secondary | ICD-10-CM

## 2017-09-15 DIAGNOSIS — R519 Headache, unspecified: Secondary | ICD-10-CM

## 2017-09-15 DIAGNOSIS — K219 Gastro-esophageal reflux disease without esophagitis: Secondary | ICD-10-CM

## 2017-09-15 DIAGNOSIS — J3081 Allergic rhinitis due to animal (cat) (dog) hair and dander: Secondary | ICD-10-CM | POA: Diagnosis not present

## 2017-09-15 DIAGNOSIS — G600 Hereditary motor and sensory neuropathy: Secondary | ICD-10-CM

## 2017-09-15 DIAGNOSIS — Z6822 Body mass index (BMI) 22.0-22.9, adult: Secondary | ICD-10-CM | POA: Diagnosis not present

## 2017-09-15 DIAGNOSIS — J3089 Other allergic rhinitis: Secondary | ICD-10-CM | POA: Diagnosis not present

## 2017-09-15 MED ORDER — VENLAFAXINE HCL ER 37.5 MG PO CP24: 37.5000 mg | ORAL_CAPSULE | Freq: Every day | ORAL | 1 refills | Status: DC

## 2017-09-15 NOTE — Patient Instructions (Addendum)
Try magnesium 500 mg once daily   Can try sennakot for constipation Milk of magnesia just as needed - start with 1/2 the bottle with plenty of water, take second 1/2 4 hours later only if needed  Try red yeast rice supplement for cholesterol - stop taking if you have muscle aches/new muscle fatigue  Look at your saturated fat intake   Preventing High Cholesterol Cholesterol is a waxy, fat-like substance that your body needs in small amounts. Your liver makes all the cholesterol that your body needs. Having high cholesterol (hypercholesterolemia) increases your risk for heart disease and stroke. Extra (excess) cholesterol comes from the food you eat, such as animal-based fat (saturated fat) from meat and some dairy products. High cholesterol can often be prevented with diet and lifestyle changes. If you already have high cholesterol, you can control it with diet and lifestyle changes, as well as medicine. What nutrition changes can be made?  Eat less saturated fat. Foods that contain saturated fat include red meat and some dairy products.  Avoid processed meats, like bacon and lunch meats.  Avoid trans fats, which are found in margarine and some baked goods.  Avoid foods and beverages that have added sugars.  Eat more fruits, vegetables, and whole grains.  Choose healthy sources of protein, such as fish, poultry, and nuts.  Choose healthy sources of fat, such as: ? Nuts. ? Vegetable oils, especially olive oil. ? Fish that have healthy fats (omega-3 fatty acids), such as mackerel or salmon. What lifestyle changes can be made?  Lose weight if you are overweight. Losing 5-10 lb (2.3-4.5 kg) can help prevent or control high cholesterol and reduce your risk for diabetes and high blood pressure. Ask your health care provider to help you with a diet and exercise plan to safely lose weight.  Get enough exercise. Do at least 150 minutes of moderate-intensity exercise each week. ? You could  do this in short exercise sessions several times a day, or you could do longer exercise sessions a few times a week. For example, you could take a brisk 10-minute walk or bike ride, 3 times a day, for 5 days a week.  Do not smoke. If you need help quitting, ask your health care provider.  Limit your alcohol intake. If you drink alcohol, limit alcohol intake to no more than 1 drink a day for nonpregnant women and 2 drinks a day for men. One drink equals 12 oz of beer, 5 oz of wine, or 1 oz of hard liquor. Why are these changes important? If you have high cholesterol, deposits (plaques) may build up on the walls of your blood vessels. Plaques make the arteries narrower and stiffer, which can restrict or block blood flow and cause blood clots to form. This greatly increases your risk for heart attack and stroke. Making diet and lifestyle changes can reduce your risk for these life-threatening conditions. What can I do to lower my risk?  Manage your risk factors for high cholesterol. Talk with your health care provider about all of your risk factors and how to lower your risk.  Manage other conditions that you have, such as diabetes or high blood pressure (hypertension).  Have your cholesterol checked at regular intervals.  Keep all follow-up visits as told by your health care provider. This is important. How is this treated? In addition to diet and lifestyle changes, your health care provider may recommend medicines to help lower cholesterol, such as a medicine to reduce the amount  of cholesterol made in your liver. You may need medicine if:  Diet and lifestyle changes do not lower your cholesterol enough.  You have high cholesterol and other risk factors for heart disease or stroke.  Take over-the-counter and prescription medicines only as told by your health care provider. Where to find more information:  American Heart Association:  ThisTune.com.pt.jsp  National Heart, Lung, and Blood Institute: FrenchToiletries.com.cy Summary  High cholesterol increases your risk for heart disease and stroke. By keeping your cholesterol level low, you can reduce your risk for these conditions.  Diet and lifestyle changes are the most important steps in preventing high cholesterol.  Work with your health care provider to manage your risk factors, and have your blood tested regularly. This information is not intended to replace advice given to you by your health care provider. Make sure you discuss any questions you have with your health care provider. Document Released: 03/30/2015 Document Revised: 11/22/2015 Document Reviewed: 11/22/2015 Elsevier Interactive Patient Education  [employer].

## 2017-09-16 DIAGNOSIS — M549 Dorsalgia, unspecified: Secondary | ICD-10-CM | POA: Diagnosis not present

## 2017-09-16 DIAGNOSIS — M542 Cervicalgia: Secondary | ICD-10-CM | POA: Diagnosis not present

## 2017-09-16 DIAGNOSIS — G894 Chronic pain syndrome: Secondary | ICD-10-CM | POA: Diagnosis not present

## 2017-09-16 LAB — LIPID PANEL
Cholesterol: 210 mg/dL — ABNORMAL HIGH (ref <200)
HDL: 69 mg/dL (ref >50)
LDL Cholesterol (Calc): 126 mg/dL (calc) — ABNORMAL HIGH
Non-HDL Cholesterol (Calc): 141 mg/dL (calc) — ABNORMAL HIGH (ref <130)
Total CHOL/HDL Ratio: 3 (calc) (ref <5.0)
Triglycerides: 62 mg/dL (ref <150)

## 2017-09-16 LAB — CBC WITH DIFFERENTIAL/PLATELET
Basophils Absolute: 31 cells/uL (ref 0–200)
Basophils Relative: 0.5 %
Eosinophils Absolute: 87 cells/uL (ref 15–500)
Eosinophils Relative: 1.4 %
HCT: 41.8 % (ref 35.0–45.0)
Hemoglobin: 13.4 g/dL (ref 11.7–15.5)
Lymphs Abs: 1606 cells/uL (ref 850–3900)
MCH: 25.9 pg — ABNORMAL LOW (ref 27.0–33.0)
MCHC: 32.1 g/dL (ref 32.0–36.0)
MCV: 80.9 fL (ref 80.0–100.0)
MPV: 10 fL (ref 7.5–12.5)
Monocytes Relative: 8.1 %
Neutro Abs: 3974 cells/uL (ref 1500–7800)
Neutrophils Relative %: 64.1 %
Platelets: 324 10*3/uL (ref 140–400)
RBC: 5.17 10*6/uL — ABNORMAL HIGH (ref 3.80–5.10)
RDW: 13 % (ref 11.0–15.0)
Total Lymphocyte: 25.9 %
WBC mixed population: 502 cells/uL (ref 200–950)
WBC: 6.2 10*3/uL (ref 3.8–10.8)

## 2017-09-16 LAB — COMPLETE METABOLIC PANEL WITH GFR
AG Ratio: 1.5 (calc) (ref 1.0–2.5)
ALT: 20 U/L (ref 6–29)
AST: 22 U/L (ref 10–35)
Albumin: 4.3 g/dL (ref 3.6–5.1)
Alkaline phosphatase (APISO): 70 U/L (ref 33–115)
BUN: 11 mg/dL (ref 7–25)
CO2: 26 mmol/L (ref 20–32)
Calcium: 9.6 mg/dL (ref 8.6–10.2)
Chloride: 104 mmol/L (ref 98–110)
Creat: 0.75 mg/dL (ref 0.50–1.10)
GFR, Est African American: 109 mL/min/{1.73_m2} (ref >=60)
GFR, Est Non African American: 94 mL/min/{1.73_m2} (ref >=60)
Globulin: 2.9 g/dL (calc) (ref 1.9–3.7)
Glucose, Bld: 84 mg/dL (ref 65–99)
Potassium: 4.2 mmol/L (ref 3.5–5.3)
Sodium: 137 mmol/L (ref 135–146)
Total Bilirubin: 0.3 mg/dL (ref 0.2–1.2)
Total Protein: 7.2 g/dL (ref 6.1–8.1)

## 2017-09-16 LAB — MAGNESIUM: Magnesium: 2 mg/dL (ref 1.5–2.5)

## 2017-09-16 LAB — HEMOGLOBIN A1C
Hgb A1c MFr Bld: 5.4 % of total Hgb (ref <5.7)
Mean Plasma Glucose: 108 (calc)
eAG (mmol/L): 6 (calc)

## 2017-09-16 LAB — TSH: TSH: 1.44 mIU/L

## 2017-09-20 ENCOUNTER — Ambulatory Visit: Payer: Self-pay

## 2017-09-20 DIAGNOSIS — J3089 Other allergic rhinitis: Secondary | ICD-10-CM | POA: Diagnosis not present

## 2017-09-20 DIAGNOSIS — J301 Allergic rhinitis due to pollen: Secondary | ICD-10-CM | POA: Diagnosis not present

## 2017-09-20 DIAGNOSIS — J3081 Allergic rhinitis due to animal (cat) (dog) hair and dander: Secondary | ICD-10-CM | POA: Diagnosis not present

## 2017-09-26 DIAGNOSIS — J3089 Other allergic rhinitis: Secondary | ICD-10-CM | POA: Diagnosis not present

## 2017-09-26 DIAGNOSIS — J3081 Allergic rhinitis due to animal (cat) (dog) hair and dander: Secondary | ICD-10-CM | POA: Diagnosis not present

## 2017-09-26 DIAGNOSIS — J301 Allergic rhinitis due to pollen: Secondary | ICD-10-CM | POA: Diagnosis not present

## 2017-09-27 NOTE — Progress Notes (Signed)
   THERAPIST PROGRESS NOTE  Session Time:60 min  Participation Level: Active  Behavioral Response: CasualAlertEuthymic  Type of Therapy: Individual Therapy  Treatment Goals addressed: Coping  Interventions: CBT and Motivational Interviewing  Summary: Brandi Erickson is a 47 y.o. female who presents with a reduction in symptoms.  Therapist met with Patient in an outpatient setting to assess current mood and assist with making progress towards goals through the use of therapeutic intervention. Therapist did a brief mood check, assessing anger, fear, disgust, excitement, happiness, and sadness.  Patient reports that she has had a "favorable" mood lately.  SHe reports that she is dating again and the current relationship is going well.  She reports that she continues to go to court with her estranged husband.  She was able to explore what went wrong in her previous relationships and how she wants to improve the current relationship.  Explored Communication styles and how effective communication can be achieved    Suicidal/Homicidal: No  Plan: Return again in 2 weeks.  Diagnosis: Axis I: Mood Disorder    Axis II: No diagnosis    Lubertha South, LCSW 5/23/20118

## 2017-09-28 ENCOUNTER — Encounter: Payer: Self-pay | Admitting: Internal Medicine

## 2017-10-04 ENCOUNTER — Ambulatory Visit: Payer: Medicare Other | Admitting: Licensed Clinical Social Worker

## 2017-10-05 DIAGNOSIS — J301 Allergic rhinitis due to pollen: Secondary | ICD-10-CM | POA: Diagnosis not present

## 2017-10-05 DIAGNOSIS — J3081 Allergic rhinitis due to animal (cat) (dog) hair and dander: Secondary | ICD-10-CM | POA: Diagnosis not present

## 2017-10-05 DIAGNOSIS — J3089 Other allergic rhinitis: Secondary | ICD-10-CM | POA: Diagnosis not present

## 2017-10-12 ENCOUNTER — Ambulatory Visit
Admission: RE | Admit: 2017-10-12 | Discharge: 2017-10-12 | Disposition: A | Discharge status: Home / Self Care | Payer: Medicare Other | Source: Ambulatory Visit | Attending: Internal Medicine | Admitting: Internal Medicine

## 2017-10-12 DIAGNOSIS — Z1231 Encounter for screening mammogram for malignant neoplasm of breast: Secondary | ICD-10-CM

## 2017-10-13 DIAGNOSIS — J301 Allergic rhinitis due to pollen: Secondary | ICD-10-CM | POA: Diagnosis not present

## 2017-10-13 DIAGNOSIS — J3089 Other allergic rhinitis: Secondary | ICD-10-CM | POA: Diagnosis not present

## 2017-10-13 DIAGNOSIS — M549 Dorsalgia, unspecified: Secondary | ICD-10-CM | POA: Diagnosis not present

## 2017-10-13 DIAGNOSIS — Z79899 Other long term (current) drug therapy: Secondary | ICD-10-CM | POA: Diagnosis not present

## 2017-10-13 DIAGNOSIS — J3081 Allergic rhinitis due to animal (cat) (dog) hair and dander: Secondary | ICD-10-CM | POA: Diagnosis not present

## 2017-10-13 DIAGNOSIS — Z79891 Long term (current) use of opiate analgesic: Secondary | ICD-10-CM | POA: Diagnosis not present

## 2017-10-13 DIAGNOSIS — G894 Chronic pain syndrome: Secondary | ICD-10-CM | POA: Diagnosis not present

## 2017-10-13 DIAGNOSIS — M542 Cervicalgia: Secondary | ICD-10-CM | POA: Diagnosis not present

## 2017-10-17 DIAGNOSIS — J3089 Other allergic rhinitis: Secondary | ICD-10-CM | POA: Diagnosis not present

## 2017-10-17 DIAGNOSIS — J3081 Allergic rhinitis due to animal (cat) (dog) hair and dander: Secondary | ICD-10-CM | POA: Diagnosis not present

## 2017-10-17 DIAGNOSIS — J301 Allergic rhinitis due to pollen: Secondary | ICD-10-CM | POA: Diagnosis not present

## 2017-10-20 DIAGNOSIS — R51 Headache: Secondary | ICD-10-CM | POA: Diagnosis not present

## 2017-10-20 DIAGNOSIS — R4789 Other speech disturbances: Secondary | ICD-10-CM | POA: Diagnosis not present

## 2017-10-20 DIAGNOSIS — H905 Unspecified sensorineural hearing loss: Secondary | ICD-10-CM | POA: Diagnosis not present

## 2017-10-27 DIAGNOSIS — J301 Allergic rhinitis due to pollen: Secondary | ICD-10-CM | POA: Diagnosis not present

## 2017-10-27 DIAGNOSIS — J3081 Allergic rhinitis due to animal (cat) (dog) hair and dander: Secondary | ICD-10-CM | POA: Diagnosis not present

## 2017-10-27 DIAGNOSIS — J3089 Other allergic rhinitis: Secondary | ICD-10-CM | POA: Diagnosis not present

## 2017-11-01 DIAGNOSIS — J3089 Other allergic rhinitis: Secondary | ICD-10-CM | POA: Diagnosis not present

## 2017-11-01 DIAGNOSIS — J301 Allergic rhinitis due to pollen: Secondary | ICD-10-CM | POA: Diagnosis not present

## 2017-11-01 DIAGNOSIS — J3081 Allergic rhinitis due to animal (cat) (dog) hair and dander: Secondary | ICD-10-CM | POA: Diagnosis not present

## 2017-11-02 ENCOUNTER — Ambulatory Visit: Payer: Medicare Other | Admitting: Licensed Clinical Social Worker

## 2017-11-07 DIAGNOSIS — J301 Allergic rhinitis due to pollen: Secondary | ICD-10-CM | POA: Diagnosis not present

## 2017-11-07 DIAGNOSIS — J3081 Allergic rhinitis due to animal (cat) (dog) hair and dander: Secondary | ICD-10-CM | POA: Diagnosis not present

## 2017-11-07 DIAGNOSIS — J3089 Other allergic rhinitis: Secondary | ICD-10-CM | POA: Diagnosis not present

## 2017-11-18 DIAGNOSIS — J3081 Allergic rhinitis due to animal (cat) (dog) hair and dander: Secondary | ICD-10-CM | POA: Diagnosis not present

## 2017-11-18 DIAGNOSIS — J3089 Other allergic rhinitis: Secondary | ICD-10-CM | POA: Diagnosis not present

## 2017-11-18 DIAGNOSIS — J301 Allergic rhinitis due to pollen: Secondary | ICD-10-CM | POA: Diagnosis not present

## 2017-11-21 DIAGNOSIS — M79671 Pain in right foot: Secondary | ICD-10-CM | POA: Diagnosis not present

## 2017-11-21 DIAGNOSIS — M79672 Pain in left foot: Secondary | ICD-10-CM

## 2017-11-21 DIAGNOSIS — Z978 Presence of other specified devices: Secondary | ICD-10-CM | POA: Diagnosis not present

## 2017-11-21 HISTORY — DX: Pain in left foot: M79.672

## 2017-11-24 ENCOUNTER — Ambulatory Visit (INDEPENDENT_AMBULATORY_CARE_PROVIDER_SITE_OTHER): Payer: Medicare Other | Admitting: Licensed Clinical Social Worker

## 2017-11-24 DIAGNOSIS — F39 Unspecified mood [affective] disorder: Secondary | ICD-10-CM | POA: Diagnosis not present

## 2017-11-25 DIAGNOSIS — J301 Allergic rhinitis due to pollen: Secondary | ICD-10-CM | POA: Diagnosis not present

## 2017-11-25 DIAGNOSIS — J3089 Other allergic rhinitis: Secondary | ICD-10-CM | POA: Diagnosis not present

## 2017-11-25 DIAGNOSIS — J3081 Allergic rhinitis due to animal (cat) (dog) hair and dander: Secondary | ICD-10-CM | POA: Diagnosis not present

## 2017-11-29 DIAGNOSIS — J4530 Mild persistent asthma, uncomplicated: Secondary | ICD-10-CM | POA: Diagnosis not present

## 2017-11-29 DIAGNOSIS — J3081 Allergic rhinitis due to animal (cat) (dog) hair and dander: Secondary | ICD-10-CM | POA: Diagnosis not present

## 2017-11-29 DIAGNOSIS — H1045 Other chronic allergic conjunctivitis: Secondary | ICD-10-CM | POA: Diagnosis not present

## 2017-11-29 DIAGNOSIS — J3089 Other allergic rhinitis: Secondary | ICD-10-CM | POA: Diagnosis not present

## 2017-11-29 DIAGNOSIS — J301 Allergic rhinitis due to pollen: Secondary | ICD-10-CM | POA: Diagnosis not present

## 2017-12-05 DIAGNOSIS — J301 Allergic rhinitis due to pollen: Secondary | ICD-10-CM | POA: Diagnosis not present

## 2017-12-05 DIAGNOSIS — J3089 Other allergic rhinitis: Secondary | ICD-10-CM | POA: Diagnosis not present

## 2017-12-05 DIAGNOSIS — J3081 Allergic rhinitis due to animal (cat) (dog) hair and dander: Secondary | ICD-10-CM | POA: Diagnosis not present

## 2017-12-08 DIAGNOSIS — J3081 Allergic rhinitis due to animal (cat) (dog) hair and dander: Secondary | ICD-10-CM | POA: Diagnosis not present

## 2017-12-08 DIAGNOSIS — M542 Cervicalgia: Secondary | ICD-10-CM | POA: Diagnosis not present

## 2017-12-08 DIAGNOSIS — Z79899 Other long term (current) drug therapy: Secondary | ICD-10-CM | POA: Diagnosis not present

## 2017-12-08 DIAGNOSIS — G894 Chronic pain syndrome: Secondary | ICD-10-CM | POA: Diagnosis not present

## 2017-12-08 DIAGNOSIS — M549 Dorsalgia, unspecified: Secondary | ICD-10-CM | POA: Diagnosis not present

## 2017-12-08 DIAGNOSIS — Z79891 Long term (current) use of opiate analgesic: Secondary | ICD-10-CM | POA: Diagnosis not present

## 2017-12-08 DIAGNOSIS — J3089 Other allergic rhinitis: Secondary | ICD-10-CM | POA: Diagnosis not present

## 2017-12-08 DIAGNOSIS — J301 Allergic rhinitis due to pollen: Secondary | ICD-10-CM | POA: Diagnosis not present

## 2017-12-12 DIAGNOSIS — J301 Allergic rhinitis due to pollen: Secondary | ICD-10-CM | POA: Diagnosis not present

## 2017-12-12 DIAGNOSIS — J3081 Allergic rhinitis due to animal (cat) (dog) hair and dander: Secondary | ICD-10-CM | POA: Diagnosis not present

## 2017-12-12 DIAGNOSIS — J3089 Other allergic rhinitis: Secondary | ICD-10-CM | POA: Diagnosis not present

## 2017-12-20 DIAGNOSIS — J301 Allergic rhinitis due to pollen: Secondary | ICD-10-CM | POA: Diagnosis not present

## 2017-12-20 DIAGNOSIS — J3089 Other allergic rhinitis: Secondary | ICD-10-CM | POA: Diagnosis not present

## 2017-12-20 DIAGNOSIS — J3081 Allergic rhinitis due to animal (cat) (dog) hair and dander: Secondary | ICD-10-CM | POA: Diagnosis not present

## 2017-12-21 NOTE — Progress Notes (Signed)
This very nice 47 y.o. DBF presents for  follow up with HTN, HLD, Pre-Diabetes and Vitamin D Deficiency. 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 She is followed at [facility].      Patient is also followed expectantly for labile HTN & BP has been controlled at home. Today's BP is at goal - 116/68. Patient has had no complaints of any cardiac type chest pain, palpitations, dyspnea / orthopnea / PND, dizziness, claudication, or dependent edema.     Hyperlipidemia is not controlled with diet. Current Lipids are not at goal: Lab Results  Component Value Date   CHOL 192 12/22/2017   HDL 58 12/22/2017   LDLCALC 116 (H) 12/22/2017   TRIG 83 12/22/2017   CHOLHDL 3.3 12/22/2017      Also, the patient has history of PreDiabetes  (A1c 5.7%/2016) and she has had no symptoms of reactive hypoglycemia, diabetic polys, paresthesias or visual blurring.  Current A1c is Normal & at goal: Lab Results  Component Value Date   HGBA1C 5.5 12/22/2017      Further, the patient also has history of Vitamin D Deficiency ("25"/2016)  and supplements vitamin D without any suspected side-effects. Current vitamin D is not at goal (70-100): Lab Results  Component Value Date   VD25OH 43 12/22/2017   Current Outpatient Medications on File Prior to Visit  Medication Sig  . albuterol (PROAIR HFA) 108 (90 Base) MCG/ACT inhaler Inhale 2 puffs into the lungs every 6 (six) hours as needed for wheezing or shortness of breath.  Marland Kitchen aspirin EC 81 MG tablet Take 81 mg by mouth daily.  . cetirizine (ZYRTEC) 10 MG tablet Take 10 mg by mouth every evening.  . fluticasone (CUTIVATE) 0.05 % cream Apply 1 application topically 2 (two) times daily.   . Fluticasone-Salmeterol (ADVAIR DISKUS) 250-50 MCG/DOSE AEPB Inhale 1 puff into the lungs every 12 (twelve) hours.  . gabapentin (NEURONTIN) 800 MG tablet Take 800 mg by mouth 3 (three) times daily.  Marland Kitchen  HYDROcodone-acetaminophen (NORCO) 7.5-325 MG tablet Take 1 tablet by mouth. Takes 1 tablet 3 times a day.  . methocarbamol (ROBAXIN) 500 MG tablet Take 1 tablet (500 mg total) by mouth 3 (three) times daily as needed for muscle spasms.  . montelukast (SINGULAIR) 10 MG tablet Take 1 tablet (10 mg total) by mouth at bedtime.  . Olopatadine HCl (PATADAY) 0.2 % SOLN Apply to each eye as needed  . omeprazole (PRILOSEC) 40 MG capsule Take 1 capsule (40 mg total) by mouth daily.  Marland Kitchen OVER THE COUNTER MEDICATION Fiber laxative 2 tsp twice a day.  . senna (SENOKOT) 8.6 MG tablet Take 1 tablet by mouth every other day.  . venlafaxine XR (EFFEXOR XR) 37.5 MG 24 hr capsule Take 1 capsule (37.5 mg total) by mouth daily with breakfast.  . Vitamin D, Ergocalciferol, (DRISDOL) 50000 units CAPS capsule Take 1 capsule (50,000 Units total) by mouth every 3 (three) days.   No current facility-administered medications on file prior to visit.    Allergies  Allergen Reactions  . Linzess [Linaclotide] Rash  . Iodinated Diagnostic Agents Nausea And Vomiting    MRI dye  nausea  . Oxycontin [Oxycodone Hcl] Nausea And Vomiting  . Penicillins Hives  . Shellfish Allergy Other (See Comments)  . Sulfa Antibiotics Hives  . Topamax [Topiramate] Hives   PMHx:   Past Medical History:  Diagnosis Date  .  Allergy   . Asthma   . Chronic constipation   . CMT (Charcot-Marie-Tooth disease)   . GERD (gastroesophageal reflux disease)   . 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 Seasonal allergies   . Uterine polyp   . Wears glasses    Immunization History  Administered Date(s) Administered  . PPD Test 08/26/2015, 09/16/2016  . Tdap 09/16/2016   Past Surgical History:  Procedure Laterality Date  . COLONOSCOPY N/A 11/01/2012   Procedure: COLONOSCOPY;  Surgeon: Rogene Houston, MD;  Location: [facility];  Service: Endoscopy;  Laterality: N/A;  130  . DILATION  AND CURETTAGE OF UTERUS    . ESOPHAGEAL MANOMETRY  05/17/2011   Procedure: ESOPHAGEAL MANOMETRY (EM);  Surgeon: Beryle Beams, MD;  Location: [facility];  Service: Endoscopy;  Laterality: N/A;  . FOOT NEUROMA SURGERY Right 06-05-2013  . HYSTEROSCOPY W/D&C N/A 06/27/2013   Procedure: HYSTEROSCOPY POLPYECTOMY  ;  Surgeon: Governor Specking, MD;  Location: [facility];  Service: Gynecology;  Laterality: N/A;  . LAPAROSCOPY N/A 06/27/2013   Procedure: LAPAROSCOPY WITH extensive lysis of adhesions;  Surgeon: Governor Specking, MD;  Location: [facility];  Service: Gynecology;  Laterality: N/A;  . TONSILLECTOMY    . TONSILLECTOMY  as child  . TUBAL LIGATION  1999  . tubal reconstruction  1997   FHx:    Reviewed / unchanged  SHx:    Reviewed / unchanged   Systems Review:  Constitutional: Denies fever, chills, wt changes, headaches, insomnia, fatigue, night sweats, change in appetite. Eyes: Denies redness, blurred vision, diplopia, discharge, itchy, watery eyes.  ENT: Denies discharge, congestion, post nasal drip, epistaxis, sore throat, earache, hearing loss, dental pain, tinnitus, vertigo, sinus pain, snoring.  CV: Denies chest pain, palpitations, irregular heartbeat, syncope, dyspnea, diaphoresis, orthopnea, PND, claudication or edema. Respiratory: denies cough, dyspnea, DOE, pleurisy, hoarseness, laryngitis, wheezing.  Gastrointestinal: Denies dysphagia, odynophagia, heartburn, reflux, water brash, abdominal pain or cramps, nausea, vomiting, bloating, diarrhea, constipation, hematemesis, melena, hematochezia  or hemorrhoids. Genitourinary: Denies dysuria, frequency, urgency, nocturia, hesitancy, discharge, hematuria or flank pain. Musculoskeletal: Denies arthralgias, myalgias, stiffness, jt. swelling, pain, limping or strain/sprain.  Skin: Denies pruritus, rash, hives, warts, acne, eczema or change in skin lesion(s). Neuro: No weakness, tremor, incoordination,  spasms, paresthesia or pain. Psychiatric: Denies confusion, memory loss or sensory loss. Endo: Denies change in weight, skin or hair change.  Heme/Lymph: No excessive bleeding, bruising or enlarged lymph nodes.  Physical Exam  BP 116/68   Pulse (!) 56   Temp (!) 97 F (36.1 C)   Resp 16   Ht 5\' 6"  (1.676 m)   Wt 140 lb (63.5 kg)   LMP 02/13/2016   BMI 22.60 kg/m   Appears  well nourished, well groomed  and in no distress.  Eyes: PERRLA, EOMs, conjunctiva no swelling or erythema. Sinuses: No frontal/maxillary tenderness ENT/Mouth: EAC's clear, TM's nl w/o erythema, bulging. Nares clear w/o erythema, swelling, exudates. Oropharynx clear without erythema or exudates. Oral hygiene is good. Tongue normal, non obstructing. Hearing intact.  Neck: Supple. Thyroid not palpable. Car 2+/2+ without bruits, nodes or JVD. Chest: Respirations nl with BS clear & equal w/o rales, rhonchi, wheezing or stridor.  Cor: Heart sounds normal w/ regular rate and rhythm without sig. murmurs, gallops, clicks or rubs. Peripheral pulses normal and equal  without edema.  Abdomen: Soft & bowel sounds normal. Non-tender w/o guarding, rebound, hernias, masses or organomegaly.  Lymphatics: Unremarkable.  Musculoskeletal: Full ROM all peripheral extremities, joint stability, 5/5 strength and normal gait.  Skin: Warm, dry without exposed rashes, lesions or ecchymosis apparent.  Neuro: Cranial nerves intact, reflexes equal bilaterally. Sensory-motor testing grossly intact. Tendon reflexes grossly intact.  Pysch: Alert & oriented x 3.  Insight and judgement nl & appropriate. No ideations.  Assessment and Plan:  1. Labile hypertension  - Continue medication, monitor blood pressure at home.  - Continue DASH diet.  Reminder to go to the ER if any CP,  SOB, nausea, dizziness, severe HA, changes vision/speech.  - CBC with Differential/Platelet - COMPLETE METABOLIC PANEL WITH GFR - Magnesium - TSH  2.  - Continue  diet/meds, exercise,& lifestyle modifications.  - Continue monitor periodic cholesterol/liver & renal functions  Hyperlipidemia, mixed  - Lipid panel - TSH  3. Abnormal glucose  - Continue diet, exercise, lifestyle modifications.  - Monitor appropriate labs.  - Hemoglobin A1c - Insulin, random  4. Vitamin D deficiency  - Continue supplementation.  - VITAMIN D 25 Hydroxyl  5. CMT (Charcot-Marie-Tooth disease)  - Hemoglobin A1c - Insulin, random  6. Chronic pain syndrome  - follows w/[facility]  7. Medication management - CBC with Differential/Platelet - COMPLETE METABOLIC PANEL WITH GFR - Magnesium - Lipid panel - TSH - Hemoglobin A1c - Insulin, random - VITAMIN D 25 Hydroxyl       Discussed  regular exercise, BP monitoring, weight control to achieve/maintain BMI less than 25 and discussed med and SE's. Recommended labs to assess and monitor clinical status with further disposition pending results of labs. Over 30 minutes of exam, counseling, chart review was performed.

## 2017-12-21 NOTE — Patient Instructions (Signed)

## 2017-12-22 ENCOUNTER — Ambulatory Visit (INDEPENDENT_AMBULATORY_CARE_PROVIDER_SITE_OTHER): Payer: Medicare Other | Admitting: Internal Medicine

## 2017-12-22 ENCOUNTER — Encounter: Payer: Self-pay | Admitting: Internal Medicine

## 2017-12-22 VITALS — BP 116/68 | HR 56 | Temp 97.0°F | Resp 16 | Ht 66.0 in | Wt 140.0 lb

## 2017-12-22 DIAGNOSIS — E559 Vitamin D deficiency, unspecified: Secondary | ICD-10-CM

## 2017-12-22 DIAGNOSIS — G894 Chronic pain syndrome: Secondary | ICD-10-CM

## 2017-12-22 DIAGNOSIS — R7309 Other abnormal glucose: Secondary | ICD-10-CM

## 2017-12-22 DIAGNOSIS — R0989 Other specified symptoms and signs involving the circulatory and respiratory systems: Secondary | ICD-10-CM

## 2017-12-22 DIAGNOSIS — Z79899 Other long term (current) drug therapy: Secondary | ICD-10-CM

## 2017-12-22 DIAGNOSIS — G600 Hereditary motor and sensory neuropathy: Secondary | ICD-10-CM

## 2017-12-22 DIAGNOSIS — E782 Mixed hyperlipidemia: Secondary | ICD-10-CM

## 2017-12-23 LAB — CBC WITH DIFFERENTIAL/PLATELET
Basophils Absolute: 28 cells/uL (ref 0–200)
Basophils Relative: 0.6 %
Eosinophils Absolute: 78 cells/uL (ref 15–500)
Eosinophils Relative: 1.7 %
HCT: 40.1 % (ref 35.0–45.0)
Hemoglobin: 12.7 g/dL (ref 11.7–15.5)
Lymphs Abs: 1845 cells/uL (ref 850–3900)
MCH: 25.7 pg — ABNORMAL LOW (ref 27.0–33.0)
MCHC: 31.7 g/dL — ABNORMAL LOW (ref 32.0–36.0)
MCV: 81 fL (ref 80.0–100.0)
MPV: 10 fL (ref 7.5–12.5)
Monocytes Relative: 8.4 %
Neutro Abs: 2263 cells/uL (ref 1500–7800)
Neutrophils Relative %: 49.2 %
Platelets: 328 10*3/uL (ref 140–400)
RBC: 4.95 10*6/uL (ref 3.80–5.10)
RDW: 13.1 % (ref 11.0–15.0)
Total Lymphocyte: 40.1 %
WBC mixed population: 386 cells/uL (ref 200–950)
WBC: 4.6 10*3/uL (ref 3.8–10.8)

## 2017-12-23 LAB — LIPID PANEL
Cholesterol: 192 mg/dL (ref <200)
HDL: 58 mg/dL (ref >50)
LDL Cholesterol (Calc): 116 mg/dL (calc) — ABNORMAL HIGH
Non-HDL Cholesterol (Calc): 134 mg/dL (calc) — ABNORMAL HIGH (ref <130)
Total CHOL/HDL Ratio: 3.3 (calc) (ref <5.0)
Triglycerides: 83 mg/dL (ref <150)

## 2017-12-23 LAB — HEMOGLOBIN A1C
Hgb A1c MFr Bld: 5.5 % of total Hgb (ref <5.7)
Mean Plasma Glucose: 111 (calc)
eAG (mmol/L): 6.2 (calc)

## 2017-12-23 LAB — COMPLETE METABOLIC PANEL WITH GFR
AG Ratio: 1.7 (calc) (ref 1.0–2.5)
ALT: 11 U/L (ref 6–29)
AST: 16 U/L (ref 10–35)
Albumin: 4.5 g/dL (ref 3.6–5.1)
Alkaline phosphatase (APISO): 61 U/L (ref 33–115)
BUN: 9 mg/dL (ref 7–25)
CO2: 30 mmol/L (ref 20–32)
Calcium: 9.7 mg/dL (ref 8.6–10.2)
Chloride: 103 mmol/L (ref 98–110)
Creat: 0.85 mg/dL (ref 0.50–1.10)
GFR, Est African American: 94 mL/min/{1.73_m2} (ref >=60)
GFR, Est Non African American: 81 mL/min/{1.73_m2} (ref >=60)
Globulin: 2.7 g/dL (calc) (ref 1.9–3.7)
Glucose, Bld: 90 mg/dL (ref 65–99)
Potassium: 4.3 mmol/L (ref 3.5–5.3)
Sodium: 136 mmol/L (ref 135–146)
Total Bilirubin: 0.5 mg/dL (ref 0.2–1.2)
Total Protein: 7.2 g/dL (ref 6.1–8.1)

## 2017-12-23 LAB — TSH: TSH: 2.49 mIU/L

## 2017-12-23 LAB — VITAMIN D 25 HYDROXY (VIT D DEFICIENCY, FRACTURES): Vit D, 25-Hydroxy: 43 ng/mL (ref 30–100)

## 2017-12-23 LAB — INSULIN, RANDOM: Insulin: 4.7 u[IU]/mL (ref 2.0–19.6)

## 2017-12-23 LAB — MAGNESIUM: Magnesium: 1.9 mg/dL (ref 1.5–2.5)

## 2017-12-26 ENCOUNTER — Ambulatory Visit (INDEPENDENT_AMBULATORY_CARE_PROVIDER_SITE_OTHER): Payer: Medicare Other | Admitting: Licensed Clinical Social Worker

## 2017-12-26 ENCOUNTER — Other Ambulatory Visit: Payer: Self-pay | Admitting: *Deleted

## 2017-12-26 DIAGNOSIS — E559 Vitamin D deficiency, unspecified: Secondary | ICD-10-CM

## 2017-12-26 DIAGNOSIS — F39 Unspecified mood [affective] disorder: Secondary | ICD-10-CM

## 2017-12-26 DIAGNOSIS — J4520 Mild intermittent asthma, uncomplicated: Secondary | ICD-10-CM | POA: Diagnosis not present

## 2017-12-26 MED ORDER — VITAMIN D (ERGOCALCIFEROL) 1.25 MG (50000 UNIT) PO CAPS: 50000.0000 [IU] | ORAL_CAPSULE | ORAL | 0 refills | Status: DC

## 2017-12-26 NOTE — Progress Notes (Signed)
   THERAPIST PROGRESS NOTE  Session Time: 75mn  Participation Level: Active  Behavioral Response: CasualAlertEuthymic  Type of Therapy: Individual Therapy  Treatment Goals addressed: Coping  Interventions: CBT and Motivational Interviewing  Summary: TFLORENCE YEUNGis a 46y.o. female who presents with continued symptoms of her diagnosis.   Therapist met with Patient in an outpatient setting to assess current mood and assist with making progress towards goals through the use of therapeutic intervention. Therapist did a brief mood check, assessing anger, fear, disgust, excitement, happiness, and sadness.  Assisted patient with processing her thoughts and emotions about her current relationship. Therapist encouraged Patient to discuss expectation of the relationship.  Patient reports that she gets stressed out about the relationship several times per month. Therapist modeled and role played guided imagery for relaxation to assist with her ability to relax.   Suicidal/Homicidal: No  Plan: Return again in 2 weeks.  Diagnosis: Axis I: Mood Disorder NOS    Axis II: No diagnosis    NLubertha South LCSW 12/26/2017

## 2018-01-03 NOTE — Progress Notes (Signed)
   THERAPIST PROGRESS NOTE  Session Time: 60 min  Participation Level: Active  Behavioral Response: CasualAlertEuthymic  Type of Therapy: Individual Therapy  Treatment Goals addressed: Coping  Interventions: CBT and Motivational Interviewing  Summary: Brandi Erickson is a 47 y.o. female who presents with a reduction in symptoms.  Therapist provided support for Patient as she expressed an incident the following week where she was engaged in an argument with her boyfriend.  Therapist assisted Patient with processing her emotion regarding the incidents.  Therapist assisted her with learning how she could have diffused the situations without becoming argumentative while learning from his actions. Therapist and Patient went through the altercations step by step, and Therapist replaced Patient's reported responses with more positive, more assertive responses. Therapist and Patient reviewed Patient's triggers to anger.    Suicidal/Homicidal: No  Plan: Return again in 2 weeks.  Diagnosis: Axis I: Mood Disorder NOS    Axis II: No diagnosis    Lubertha South, LCSW 11/24/2017

## 2018-01-05 DIAGNOSIS — J3089 Other allergic rhinitis: Secondary | ICD-10-CM | POA: Diagnosis not present

## 2018-01-05 DIAGNOSIS — J301 Allergic rhinitis due to pollen: Secondary | ICD-10-CM | POA: Diagnosis not present

## 2018-01-05 DIAGNOSIS — J3081 Allergic rhinitis due to animal (cat) (dog) hair and dander: Secondary | ICD-10-CM | POA: Diagnosis not present

## 2018-01-13 DIAGNOSIS — J301 Allergic rhinitis due to pollen: Secondary | ICD-10-CM | POA: Diagnosis not present

## 2018-01-13 DIAGNOSIS — J3081 Allergic rhinitis due to animal (cat) (dog) hair and dander: Secondary | ICD-10-CM | POA: Diagnosis not present

## 2018-01-13 DIAGNOSIS — J3089 Other allergic rhinitis: Secondary | ICD-10-CM | POA: Diagnosis not present

## 2018-01-16 DIAGNOSIS — J301 Allergic rhinitis due to pollen: Secondary | ICD-10-CM | POA: Diagnosis not present

## 2018-01-16 DIAGNOSIS — J3081 Allergic rhinitis due to animal (cat) (dog) hair and dander: Secondary | ICD-10-CM | POA: Diagnosis not present

## 2018-01-16 DIAGNOSIS — J3089 Other allergic rhinitis: Secondary | ICD-10-CM | POA: Diagnosis not present

## 2018-01-17 ENCOUNTER — Telehealth: Payer: Self-pay | Admitting: *Deleted

## 2018-01-17 NOTE — Telephone Encounter (Signed)
A message was left to inform the patient that Vitamin D2 50,000 units is available from [facility] for a cost of $16 to $18 for a bottle of 100 capsules. Dr Melford Aase suggests the patient order the vitamin, due to being expensive through her insurance plan.

## 2018-01-25 ENCOUNTER — Ambulatory Visit (INDEPENDENT_AMBULATORY_CARE_PROVIDER_SITE_OTHER): Payer: Medicare Other | Admitting: Licensed Clinical Social Worker

## 2018-01-25 DIAGNOSIS — F39 Unspecified mood [affective] disorder: Secondary | ICD-10-CM

## 2018-01-25 DIAGNOSIS — J3089 Other allergic rhinitis: Secondary | ICD-10-CM | POA: Diagnosis not present

## 2018-01-25 DIAGNOSIS — J3081 Allergic rhinitis due to animal (cat) (dog) hair and dander: Secondary | ICD-10-CM | POA: Diagnosis not present

## 2018-01-25 DIAGNOSIS — J301 Allergic rhinitis due to pollen: Secondary | ICD-10-CM | POA: Diagnosis not present

## 2018-01-25 NOTE — Progress Notes (Signed)
   THERAPIST PROGRESS NOTE  Session Time: 51min  Participation Level: Active  Behavioral Response: CasualAlertEuthymic  Type of Therapy: Individual Therapy  Treatment Goals addressed: Coping  Interventions: Motivational Interviewing  Summary: Brandi Erickson is a 47 y.o. female who presents with continued symptoms of diagnosis.  Patient reports "favorable" mood. Patient vented her current frustrations about her boyfriend.  Explored ways to communicate needs to him.  Discussion of intimacy and expression of intimacy.  Educated patient on low testerone and possible female sexual dysfunction.  Discussion of depression and how it presents differently in different people.   Suicidal/Homicidal: No  Plan: Return again in 2 weeks.  Diagnosis: Axis I: Depression    Axis II: No diagnosis    Lubertha South, LCSW 01/25/2018

## 2018-02-02 DIAGNOSIS — Z79899 Other long term (current) drug therapy: Secondary | ICD-10-CM | POA: Diagnosis not present

## 2018-02-02 DIAGNOSIS — G894 Chronic pain syndrome: Secondary | ICD-10-CM | POA: Diagnosis not present

## 2018-02-02 DIAGNOSIS — Z79891 Long term (current) use of opiate analgesic: Secondary | ICD-10-CM | POA: Diagnosis not present

## 2018-02-02 DIAGNOSIS — M549 Dorsalgia, unspecified: Secondary | ICD-10-CM | POA: Diagnosis not present

## 2018-02-02 DIAGNOSIS — M542 Cervicalgia: Secondary | ICD-10-CM | POA: Diagnosis not present

## 2018-02-08 DIAGNOSIS — J301 Allergic rhinitis due to pollen: Secondary | ICD-10-CM | POA: Diagnosis not present

## 2018-02-08 DIAGNOSIS — J3089 Other allergic rhinitis: Secondary | ICD-10-CM | POA: Diagnosis not present

## 2018-02-08 DIAGNOSIS — J3081 Allergic rhinitis due to animal (cat) (dog) hair and dander: Secondary | ICD-10-CM | POA: Diagnosis not present

## 2018-02-16 ENCOUNTER — Ambulatory Visit: Payer: Medicare Other | Admitting: Licensed Clinical Social Worker

## 2018-02-17 DIAGNOSIS — J3089 Other allergic rhinitis: Secondary | ICD-10-CM | POA: Diagnosis not present

## 2018-02-17 DIAGNOSIS — J3081 Allergic rhinitis due to animal (cat) (dog) hair and dander: Secondary | ICD-10-CM | POA: Diagnosis not present

## 2018-02-17 DIAGNOSIS — J301 Allergic rhinitis due to pollen: Secondary | ICD-10-CM | POA: Diagnosis not present

## 2018-03-02 DIAGNOSIS — J3081 Allergic rhinitis due to animal (cat) (dog) hair and dander: Secondary | ICD-10-CM | POA: Diagnosis not present

## 2018-03-02 DIAGNOSIS — J3089 Other allergic rhinitis: Secondary | ICD-10-CM | POA: Diagnosis not present

## 2018-03-02 DIAGNOSIS — J301 Allergic rhinitis due to pollen: Secondary | ICD-10-CM | POA: Diagnosis not present

## 2018-03-08 ENCOUNTER — Ambulatory Visit: Payer: Medicare Other | Admitting: Licensed Clinical Social Worker

## 2018-03-17 DIAGNOSIS — J301 Allergic rhinitis due to pollen: Secondary | ICD-10-CM | POA: Diagnosis not present

## 2018-03-17 DIAGNOSIS — J3081 Allergic rhinitis due to animal (cat) (dog) hair and dander: Secondary | ICD-10-CM | POA: Diagnosis not present

## 2018-03-17 DIAGNOSIS — J3089 Other allergic rhinitis: Secondary | ICD-10-CM | POA: Diagnosis not present

## 2018-03-20 DIAGNOSIS — J3081 Allergic rhinitis due to animal (cat) (dog) hair and dander: Secondary | ICD-10-CM | POA: Diagnosis not present

## 2018-03-20 DIAGNOSIS — J301 Allergic rhinitis due to pollen: Secondary | ICD-10-CM | POA: Diagnosis not present

## 2018-03-20 DIAGNOSIS — J3089 Other allergic rhinitis: Secondary | ICD-10-CM | POA: Diagnosis not present

## 2018-03-23 ENCOUNTER — Ambulatory Visit: Payer: Self-pay | Admitting: Adult Health

## 2018-03-27 ENCOUNTER — Ambulatory Visit: Payer: Medicare Other | Admitting: Licensed Clinical Social Worker

## 2018-03-28 NOTE — Progress Notes (Signed)
FOLLOW UP  Assessment and Plan:   BP Off of medications and doing well Monitor blood pressure at home; patient to call if consistently greater than 130/80 Continue DASH diet.   Reminder to go to the ER if any CP, SOB, nausea, dizziness, severe HA, changes vision/speech, left arm numbness and tingling and jaw pain.  Cholesterol Currently above goal; treated by lifestyle for mild elevation - discussed at length Continue low cholesterol diet and exercise.  Check lipid panel.   Prediabetes A1C borderline Continue diet and exercise.  Perform daily foot/skin check, notify office of any concerning changes.  Check A1C  Bmi 22 Continue to recommend diet heavy in fruits and veggies and low in animal meats, cheeses, and dairy products, appropriate calorie intake Discuss exercise recommendations routinely Continue to monitor weight at each visit  Vitamin D Def At goal at last visit; continue supplementation to maintain goal of 70-100 Defer Vit D level  GERD Well managed on current medications Discussed diet, avoiding triggers and other lifestyle changes  Anxiety  Continue follow up with behavioral health, doing well on effexor 37.5 mg daily Well managed by current regimen; continue medications Stress management techniques discussed, increase water, good sleep hygiene discussed, increase exercise, and increase veggies.   Headaches No red flags, try OTC analgesics, hydration, rest in cool dark room established with neurology via [facility], will follow up if not improving  Continue diet and meds as discussed. Further disposition pending results of labs. Discussed med's effects and SE's.   Over 30 minutes of exam, counseling, chart review, and critical decision making was performed.   Future Appointments  Date Time Provider [place]  06/19/2018 11:15 AM Vicie Mutters, PA-C [facility] None  07/04/2018  3:00 PM Unk Pinto, MD [facility] None     ----------------------------------------------------------------------------------------------------------------------  HPI 47 y.o. female  presents for 3 month follow up on BP, cholesterol, prediabetes, weight and vitamin D deficiency.   Today she reports mild intermittent frontal headache (2-4/10) for the past 8-9 days. She denies sinus pressure, neck tension. She denies vision changes (though admits she is overdue for follow up with ophthalmology), fever, dizziness, photosensitivity, nausea, or other new/different symptoms. She denies snoring. She has not tried any interventions. Cannot identify aggravating or improving factors. She does occasionally wake up with this but not consistently. She denies pain at this moment.   Patient is on SS Disability for Chronic Pain Syndrome, diagnosied with Charcot Marie Tooth Disease in 28 (age 29)  and is followed at [facility]. She is also being seen by behavioral health for mild mood disorder for counseling.  No further syncopal episodes, neuro/cardiac workup were respectively unremarkable. Was instructed to increase salt and fluid intake.   She was started on effexor last year, briefly increased up to 2 tabs of 37.5 but feels 1 tab was effective and is continuing with this dose. Sleeps well, no panic attacks.   Asthma well controlled by current medications (singulair, advair discus) without recent flare.   she has a diagnosis of GERD which is currently managed by omeprazole 40 mg daily she reports symptoms is currently well controlled, and denies breakthrough reflux, burning in chest, hoarseness or cough.  She does well avoiding spicy foods that were causing breaking through.   BMI is Body mass index is 23.37 kg/m., she has been working on diet and exercise. Wt Readings from Last 3 Encounters:  03/30/18 144 lb 12.8 oz (65.7 kg)  12/22/17 140 lb (63.5 kg)  09/15/17 139 lb (63  kg)   Today their BP is BP: 100/60  She does  workout. She denies chest pain, shortness of breath, dizziness.   She is not on cholesterol medication and denies myalgias, she is on natural supplement of RYR, niacin, Omega 3.  Her cholesterol is not at goal. The cholesterol last visit was:   Lab Results  Component Value Date   CHOL 192 12/22/2017   HDL 58 12/22/2017   LDLCALC 116 (H) 12/22/2017   TRIG 83 12/22/2017   CHOLHDL 3.3 12/22/2017    She has been working on diet and exercise for glucose management, and denies foot ulcerations, increased appetite, nausea, paresthesia of the feet, polydipsia, polyuria, visual disturbances, vomiting and weight loss. Last A1C in the office was:  Lab Results  Component Value Date   HGBA1C 5.5 12/22/2017   Patient is on Vitamin D supplement.   Lab Results  Component Value Date   VD25OH 43 12/22/2017       Current Medications:  Current Outpatient Medications on File Prior to Visit  Medication Sig  . albuterol (PROAIR HFA) 108 (90 Base) MCG/ACT inhaler Inhale 2 puffs into the lungs every 6 (six) hours as needed for wheezing or shortness of breath.  Marland Kitchen aspirin EC 81 MG tablet Take 81 mg by mouth daily.  . cetirizine (ZYRTEC) 10 MG tablet Take 10 mg by mouth every evening.  . fluticasone (CUTIVATE) 0.05 % cream Apply 1 application topically 2 (two) times daily.   . Fluticasone-Salmeterol (ADVAIR DISKUS) 250-50 MCG/DOSE AEPB Inhale 1 puff into the lungs every 12 (twelve) hours.  . gabapentin (NEURONTIN) 800 MG tablet Take 800 mg by mouth 3 (three) times daily.  Marland Kitchen HYDROcodone-acetaminophen (NORCO) 7.5-325 MG tablet Take 1 tablet by mouth. Takes 1 tablet 3 times a day.  . methocarbamol (ROBAXIN) 500 MG tablet Take 1 tablet (500 mg total) by mouth 3 (three) times daily as needed for muscle spasms.  . montelukast (SINGULAIR) 10 MG tablet Take 1 tablet (10 mg total) by mouth at bedtime.  . Olopatadine HCl (PATADAY) 0.2 % SOLN Apply to each eye as needed  . omeprazole (PRILOSEC) 40 MG capsule Take 1  capsule (40 mg total) by mouth daily.  Marland Kitchen OVER THE COUNTER MEDICATION Fiber laxative 2 tsp twice a day.  . senna (SENOKOT) 8.6 MG tablet Take 1 tablet by mouth every other day.  . venlafaxine XR (EFFEXOR XR) 37.5 MG 24 hr capsule Take 1 capsule (37.5 mg total) by mouth daily with breakfast.  . Vitamin D, Ergocalciferol, (DRISDOL) 50000 units CAPS capsule Take 1 capsule (50,000 Units total) by mouth every 3 (three) days.   No current facility-administered medications on file prior to visit.      Allergies:  Allergies  Allergen Reactions  . Linzess [Linaclotide] Rash  . Iodinated Diagnostic Agents Nausea And Vomiting    MRI dye  nausea  . Oxycontin [Oxycodone Hcl] Nausea And Vomiting  . Penicillins Hives  . Shellfish Allergy Other (See Comments)  . Sulfa Antibiotics Hives  . Topamax [Topiramate] Hives     Medical History:  Past Medical History:  Diagnosis Date  . Allergy   . Asthma   . Chronic constipation   . CMT (Charcot-Marie-Tooth disease)   . GERD (gastroesophageal reflux disease)   . 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 Seasonal allergies   . Uterine polyp   . Wears glasses    Family history- Reviewed and unchanged Social history-  Reviewed and unchanged   Review of Systems:  Review of Systems  Constitutional: Negative for malaise/fatigue and weight loss.  HENT: Negative for hearing loss and tinnitus.   Eyes: Negative for blurred vision and double vision.  Respiratory: Negative for cough, shortness of breath and wheezing.   Cardiovascular: Negative for chest pain, palpitations, orthopnea, claudication and leg swelling.  Gastrointestinal: Positive for constipation. Negative for abdominal pain, blood in stool, diarrhea, heartburn, melena, nausea and vomiting.  Genitourinary: Negative.   Musculoskeletal: Negative for joint pain and myalgias.  Skin: Negative for rash.  Neurological: Positive for weakness  (R/t CMT) and headaches. Negative for dizziness, tingling and sensory change.  Endo/Heme/Allergies: Negative for polydipsia.  Psychiatric/Behavioral: Negative.  Negative for depression. The patient is not nervous/anxious and does not have insomnia.   All other systems reviewed and are negative.     Physical Exam: BP 100/60   Pulse (!) 56   Temp 97.7 F (36.5 C)   Ht 5\' 6"  (1.676 m)   Wt 144 lb 12.8 oz (65.7 kg)   LMP 02/13/2016   SpO2 98%   BMI 23.37 kg/m  Wt Readings from Last 3 Encounters:  03/30/18 144 lb 12.8 oz (65.7 kg)  12/22/17 140 lb (63.5 kg)  09/15/17 139 lb (63 kg)   General Appearance: Well nourished, in no apparent distress. Eyes: PERRLA, EOMs, conjunctiva no swelling or erythema Sinuses: No Frontal/maxillary tenderness ENT/Mouth: Ext aud canals clear, TMs without erythema, bulging. No erythema, swelling, or exudate on post pharynx.  Tonsils not swollen or erythematous. Hearing normal.  Neck: Supple, thyroid normal.  Respiratory: Respiratory effort normal, BS equal bilaterally without rales, rhonchi, wheezing or stridor.  Cardio: RRR with no MRGs. Brisk peripheral pulses without edema.  Abdomen: Soft, + BS.  Non tender, no guarding, rebound, hernias, masses. Lymphatics: Non tender without lymphadenopathy.  Musculoskeletal: Full ROM, 5/5 strength, Normal gait Skin: Warm, dry without rashes, lesions, ecchymosis.  Neuro: Cranial nerves intact. No cerebellar symptoms.  Psych: Awake and oriented X 3, flat affect, Insight and Judgment appropriate.    Izora Ribas, NP 9:53 AM [facility]

## 2018-03-30 ENCOUNTER — Ambulatory Visit (INDEPENDENT_AMBULATORY_CARE_PROVIDER_SITE_OTHER): Payer: Medicare Other | Admitting: Adult Health

## 2018-03-30 ENCOUNTER — Encounter: Payer: Self-pay | Admitting: Adult Health

## 2018-03-30 VITALS — BP 100/60 | HR 56 | Temp 97.7°F | Ht 66.0 in | Wt 144.8 lb

## 2018-03-30 DIAGNOSIS — E782 Mixed hyperlipidemia: Secondary | ICD-10-CM

## 2018-03-30 DIAGNOSIS — J3089 Other allergic rhinitis: Secondary | ICD-10-CM | POA: Diagnosis not present

## 2018-03-30 DIAGNOSIS — R51 Headache: Secondary | ICD-10-CM

## 2018-03-30 DIAGNOSIS — R7303 Prediabetes: Secondary | ICD-10-CM

## 2018-03-30 DIAGNOSIS — Z79899 Other long term (current) drug therapy: Secondary | ICD-10-CM | POA: Diagnosis not present

## 2018-03-30 DIAGNOSIS — E559 Vitamin D deficiency, unspecified: Secondary | ICD-10-CM | POA: Diagnosis not present

## 2018-03-30 DIAGNOSIS — R519 Headache, unspecified: Secondary | ICD-10-CM

## 2018-03-30 DIAGNOSIS — F419 Anxiety disorder, unspecified: Secondary | ICD-10-CM

## 2018-03-30 DIAGNOSIS — J45909 Unspecified asthma, uncomplicated: Secondary | ICD-10-CM

## 2018-03-30 DIAGNOSIS — K219 Gastro-esophageal reflux disease without esophagitis: Secondary | ICD-10-CM | POA: Diagnosis not present

## 2018-03-30 DIAGNOSIS — J3081 Allergic rhinitis due to animal (cat) (dog) hair and dander: Secondary | ICD-10-CM | POA: Diagnosis not present

## 2018-03-30 DIAGNOSIS — J301 Allergic rhinitis due to pollen: Secondary | ICD-10-CM | POA: Diagnosis not present

## 2018-03-30 MED ORDER — VENLAFAXINE HCL ER 37.5 MG PO CP24: 37.5000 mg | ORAL_CAPSULE | Freq: Every day | ORAL | 1 refills | Status: DC

## 2018-03-30 NOTE — Patient Instructions (Addendum)
Goals    . Exercise 150 min/wk Moderate Activity    . LDL CALC < 100      Know what a healthy weight is for you (roughly BMI <25) and aim to maintain this  Aim for 7+ servings of fruits and vegetables daily  65-80+ fluid ounces of water or unsweet tea for healthy kidneys  Limit to max 1 drink of alcohol per day; avoid smoking/tobacco  Limit animal fats in diet for cholesterol and heart health - choose grass fed whenever available  Avoid highly processed foods, and foods high in saturated/trans fats  Aim for low stress - take time to unwind and care for your mental health  Aim for 150 min of moderate intensity exercise weekly for heart health, and weights twice weekly for bone health  Aim for 7-9 hours of sleep daily    Please remember, common headache triggers are: sleep deprivation, dehydration, overheating, stress, hypoglycemia or skipping meals and blood sugar fluctuations, excessive pain medications or excessive alcohol use or caffeine withdrawal.   Some people have food triggers such as aged cheese, orange juice or chocolate, especially dark chocolate, or MSG (monosodium glutamate). Try to avoid these headache triggers as much possible.   It may be helpful to keep a headache diary to figure out what makes your headaches worse or brings them on and what alleviates them. Some people report headache onset after exercise but studies have shown that regular exercise may actually prevent headaches from coming. If you have exercise-induced headaches, please make sure that you drink plenty of fluid before and after exercising and that you do not over do it and do not overheat.  Also you can try CoQ10 100mg  TID or B2 400mg  a day as prevention.   Also there is such a thing called rebound headache from over use of acute medications.  Please do not use rescue or acute medications more than 10 days a month or more than 3 days per week, this can cause a withdrawal and a rebound headache.    Please go to the ER if there is weakness, thunderclap headache, visual changes, or any concerning factors

## 2018-03-31 LAB — VITAMIN D 25 HYDROXY (VIT D DEFICIENCY, FRACTURES): Vit D, 25-Hydroxy: 66 ng/mL (ref 30–100)

## 2018-03-31 LAB — CBC WITH DIFFERENTIAL/PLATELET
Absolute Monocytes: 385 cells/uL (ref 200–950)
Basophils Absolute: 19 cells/uL (ref 0–200)
Basophils Relative: 0.5 %
Eosinophils Absolute: 59 cells/uL (ref 15–500)
Eosinophils Relative: 1.6 %
HCT: 39.7 % (ref 35.0–45.0)
Hemoglobin: 12.7 g/dL (ref 11.7–15.5)
Lymphs Abs: 1443 cells/uL (ref 850–3900)
MCH: 26.1 pg — ABNORMAL LOW (ref 27.0–33.0)
MCHC: 32 g/dL (ref 32.0–36.0)
MCV: 81.7 fL (ref 80.0–100.0)
MPV: 9.7 fL (ref 7.5–12.5)
Monocytes Relative: 10.4 %
Neutro Abs: 1795 cells/uL (ref 1500–7800)
Neutrophils Relative %: 48.5 %
Platelets: 356 10*3/uL (ref 140–400)
RBC: 4.86 10*6/uL (ref 3.80–5.10)
RDW: 12.6 % (ref 11.0–15.0)
Total Lymphocyte: 39 %
WBC: 3.7 10*3/uL — ABNORMAL LOW (ref 3.8–10.8)

## 2018-03-31 LAB — LIPID PANEL
Cholesterol: 186 mg/dL (ref <200)
HDL: 63 mg/dL (ref >50)
LDL Cholesterol (Calc): 107 mg/dL (calc) — ABNORMAL HIGH
Non-HDL Cholesterol (Calc): 123 mg/dL (calc) (ref <130)
Total CHOL/HDL Ratio: 3 (calc) (ref <5.0)
Triglycerides: 70 mg/dL (ref <150)

## 2018-03-31 LAB — COMPLETE METABOLIC PANEL WITH GFR
AG Ratio: 1.8 (calc) (ref 1.0–2.5)
ALT: 30 U/L — ABNORMAL HIGH (ref 6–29)
AST: 25 U/L (ref 10–35)
Albumin: 4.6 g/dL (ref 3.6–5.1)
Alkaline phosphatase (APISO): 73 U/L (ref 33–115)
BUN: 10 mg/dL (ref 7–25)
CO2: 29 mmol/L (ref 20–32)
Calcium: 9.6 mg/dL (ref 8.6–10.2)
Chloride: 101 mmol/L (ref 98–110)
Creat: 0.72 mg/dL (ref 0.50–1.10)
GFR, Est African American: 115 mL/min/{1.73_m2} (ref >=60)
GFR, Est Non African American: 99 mL/min/{1.73_m2} (ref >=60)
Globulin: 2.6 g/dL (calc) (ref 1.9–3.7)
Glucose, Bld: 77 mg/dL (ref 65–99)
Potassium: 4.1 mmol/L (ref 3.5–5.3)
Sodium: 137 mmol/L (ref 135–146)
Total Bilirubin: 0.6 mg/dL (ref 0.2–1.2)
Total Protein: 7.2 g/dL (ref 6.1–8.1)

## 2018-03-31 LAB — TSH: TSH: 2.35 mIU/L

## 2018-03-31 LAB — MAGNESIUM: Magnesium: 2 mg/dL (ref 1.5–2.5)

## 2018-04-06 DIAGNOSIS — G894 Chronic pain syndrome: Secondary | ICD-10-CM | POA: Diagnosis not present

## 2018-04-06 DIAGNOSIS — Z79891 Long term (current) use of opiate analgesic: Secondary | ICD-10-CM | POA: Diagnosis not present

## 2018-04-06 DIAGNOSIS — J3089 Other allergic rhinitis: Secondary | ICD-10-CM | POA: Diagnosis not present

## 2018-04-06 DIAGNOSIS — Z79899 Other long term (current) drug therapy: Secondary | ICD-10-CM | POA: Diagnosis not present

## 2018-04-06 DIAGNOSIS — J301 Allergic rhinitis due to pollen: Secondary | ICD-10-CM | POA: Diagnosis not present

## 2018-04-06 DIAGNOSIS — M549 Dorsalgia, unspecified: Secondary | ICD-10-CM | POA: Diagnosis not present

## 2018-04-06 DIAGNOSIS — J3081 Allergic rhinitis due to animal (cat) (dog) hair and dander: Secondary | ICD-10-CM | POA: Diagnosis not present

## 2018-04-06 DIAGNOSIS — M542 Cervicalgia: Secondary | ICD-10-CM | POA: Diagnosis not present

## 2018-04-14 DIAGNOSIS — J3081 Allergic rhinitis due to animal (cat) (dog) hair and dander: Secondary | ICD-10-CM | POA: Diagnosis not present

## 2018-04-14 DIAGNOSIS — J301 Allergic rhinitis due to pollen: Secondary | ICD-10-CM | POA: Diagnosis not present

## 2018-04-14 DIAGNOSIS — J3089 Other allergic rhinitis: Secondary | ICD-10-CM | POA: Diagnosis not present

## 2018-04-18 DIAGNOSIS — J3089 Other allergic rhinitis: Secondary | ICD-10-CM | POA: Diagnosis not present

## 2018-04-18 DIAGNOSIS — J301 Allergic rhinitis due to pollen: Secondary | ICD-10-CM | POA: Diagnosis not present

## 2018-04-18 DIAGNOSIS — J3081 Allergic rhinitis due to animal (cat) (dog) hair and dander: Secondary | ICD-10-CM | POA: Diagnosis not present

## 2018-04-25 DIAGNOSIS — H6693 Otitis media, unspecified, bilateral: Secondary | ICD-10-CM | POA: Diagnosis not present

## 2018-04-25 DIAGNOSIS — R05 Cough: Secondary | ICD-10-CM | POA: Diagnosis not present

## 2018-04-25 DIAGNOSIS — R509 Fever, unspecified: Secondary | ICD-10-CM | POA: Diagnosis not present

## 2018-04-25 DIAGNOSIS — J018 Other acute sinusitis: Secondary | ICD-10-CM | POA: Diagnosis not present

## 2018-05-05 DIAGNOSIS — J3089 Other allergic rhinitis: Secondary | ICD-10-CM | POA: Diagnosis not present

## 2018-05-05 DIAGNOSIS — J301 Allergic rhinitis due to pollen: Secondary | ICD-10-CM | POA: Diagnosis not present

## 2018-05-05 DIAGNOSIS — J3081 Allergic rhinitis due to animal (cat) (dog) hair and dander: Secondary | ICD-10-CM | POA: Diagnosis not present

## 2018-05-11 DIAGNOSIS — J301 Allergic rhinitis due to pollen: Secondary | ICD-10-CM | POA: Diagnosis not present

## 2018-05-11 DIAGNOSIS — J3081 Allergic rhinitis due to animal (cat) (dog) hair and dander: Secondary | ICD-10-CM | POA: Diagnosis not present

## 2018-05-11 DIAGNOSIS — J3089 Other allergic rhinitis: Secondary | ICD-10-CM | POA: Diagnosis not present

## 2018-05-24 ENCOUNTER — Encounter: Payer: Self-pay | Admitting: Adult Health

## 2018-05-24 ENCOUNTER — Ambulatory Visit (INDEPENDENT_AMBULATORY_CARE_PROVIDER_SITE_OTHER): Payer: Medicare Other | Admitting: Adult Health

## 2018-05-24 VITALS — BP 102/60 | HR 58 | Temp 97.9°F

## 2018-05-24 DIAGNOSIS — R10813 Right lower quadrant abdominal tenderness: Secondary | ICD-10-CM

## 2018-05-24 DIAGNOSIS — R109 Unspecified abdominal pain: Secondary | ICD-10-CM | POA: Diagnosis not present

## 2018-05-24 DIAGNOSIS — R1031 Right lower quadrant pain: Secondary | ICD-10-CM | POA: Diagnosis not present

## 2018-05-24 NOTE — Progress Notes (Signed)
Assessment and Plan:  Brandi Erickson was seen today for flank pain.  Diagnoses and all orders for this visit:  Right sided abdominal pain/ Right lower quadrant abdominal tenderness without rebound tenderness/chills R mid and lower abdominal pain with guarding, chills, persistent Proceed with imaging to R/o diverticulitis, appendicitis, less likely cholecystitis but will obtain labs to evaluate Recommended ER precautions if any severe AB pain, unable to hold down food/water, blood in stool or vomit, chest pain, shortness of breath, or any worsening symptoms.  Advised NPO prior to CT tomorrow  Call/follow up with any new symptoms Will refer back to GI if unclear etiology  -     CBC with Differential/Platelet -     COMPLETE METABOLIC PANEL WITH GFR -     Urinalysis, Routine w reflex microscopic -     Amylase -     Lipase -     CT Abdomen Pelvis Wo Contrast; Future  Further disposition pending results of labs. Discussed med's effects and SE's.   Over 30 minutes of exam, counseling, chart review, and critical decision making was performed.   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 She reports began in AM, describes as "sharp, achy" RUQ, denies radiation, reports 8/10 and lasted that entire day. She denies constipation (has been taking fiber), she reports she had sense of fever/chills at the time, persisted for a bit over a  week, then improved this past weekend but seems to worsening again.   She reports currently having RUQ constant pain, currently 5/10, "just doesn't feel right." She denies any aggravating or alleviating factors. Pain not aggravated by movement or meals.   She reports pain does improve significantly with prescribed norco (prescribed for CMT syndrome).   She denies N/V/D, changes in bowel patterns or character, denies melena/hematochezia, urinary changes. She reports appetite intact. She endorses ongoing intermittent sensation of chills. Denies fatigue/malaise, unintentional weight loss. She reports GERD recently well controlled and hasn't needed omeprazole 40 mg which she previously was taking.   Colonoscopy 11/01/2012 by Dr. Laural Golden showed hemorrhoids but otherwise unremakable, questioned possible IBS-C at that time.   She has had total hysterctomy in 2018. Last abd CT in 2015 without abnormalities.   Past Medical History:  Diagnosis Date  . Allergy   . Asthma   . Chronic constipation   . CMT (Charcot-Marie-Tooth disease)   . GERD (gastroesophageal reflux disease)   . 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 Seasonal allergies   . Uterine polyp   . Wears glasses      Allergies  Allergen Reactions  . Linzess [Linaclotide] Rash  . Iodinated Diagnostic Agents Nausea And Vomiting    MRI dye  nausea  . Oxycontin [Oxycodone Hcl] Nausea And Vomiting  . Penicillins Hives  . Shellfish Allergy Other (See Comments)  . Sulfa Antibiotics Hives  . Topamax [Topiramate] Hives    Current Outpatient Medications on File Prior to Visit  Medication Sig  .  albuterol (PROAIR HFA) 108 (90 Base) MCG/ACT inhaler Inhale 2 puffs into the lungs every 6 (six) hours as needed for wheezing or shortness of breath.  Marland Kitchen aspirin EC 81 MG tablet Take 81 mg by mouth daily.  . cetirizine (ZYRTEC) 10 MG tablet Take 10 mg by mouth every evening.  .  fluticasone (CUTIVATE) 0.05 % cream Apply 1 application topically 2 (two) times daily.   . Fluticasone-Salmeterol (ADVAIR DISKUS) 250-50 MCG/DOSE AEPB Inhale 1 puff into the lungs every 12 (twelve) hours.  . gabapentin (NEURONTIN) 800 MG tablet Take 800 mg by mouth 3 (three) times daily.  Marland Kitchen HYDROcodone-acetaminophen (NORCO) 7.5-325 MG tablet Take 1 tablet by mouth. Takes 1 tablet 3 times a day.  . methocarbamol (ROBAXIN) 500 MG tablet Take 1 tablet (500 mg total) by mouth 3 (three) times daily as needed for muscle spasms.  . montelukast (SINGULAIR) 10 MG tablet Take 1 tablet (10 mg total) by mouth at bedtime.  . Olopatadine HCl (PATADAY) 0.2 % SOLN Apply to each eye as needed  . omeprazole (PRILOSEC) 40 MG capsule Take 1 capsule (40 mg total) by mouth daily.  Marland Kitchen OVER THE COUNTER MEDICATION Fiber laxative 2 tsp twice a day.  . senna (SENOKOT) 8.6 MG tablet Take 1 tablet by mouth every other day.  . venlafaxine XR (EFFEXOR XR) 37.5 MG 24 hr capsule Take 1 capsule (37.5 mg total) by mouth daily with breakfast.  . Vitamin D, Ergocalciferol, (DRISDOL) 50000 units CAPS capsule Take 1 capsule (50,000 Units total) by mouth every 3 (three) days.   No current facility-administered medications on file prior to visit.     ROS: all negative except above.   Physical Exam:  LMP 02/13/2016   General Appearance: Well nourished, in no apparent distress. Eyes: PERRLA, EOMs, conjunctiva no swelling or erythema Sinuses: No Frontal/maxillary tenderness ENT/Mouth: Ext aud canals clear, TMs without erythema, bulging. No erythema, swelling, or exudate on post pharynx.  Tonsils not swollen or erythematous. Hearing normal.  Neck: Supple  Respiratory: Respiratory effort normal, BS equal bilaterally without rales, rhonchi, wheezing or stridor.  Cardio: RRR with no MRGs. Brisk peripheral pulses without edema.  Abdomen: Soft, + BS.  R upper and lower quadrant tenderness with guarding, Murphey's, no rebound, palpable  hernias or masses. Lymphatics: Non tender without lymphadenopathy.  Musculoskeletal: Symmetrical strength, normal gait. No chest wall tenderness.  Skin: Warm, dry without rashes, lesions, ecchymosis.  Neuro:  Normal muscle tone, no cerebellar symptoms.  Psych: Awake and oriented X 3, flat affect, Insight and Judgment appropriate.     Izora Ribas, NP 1:51 PM [facility]

## 2018-05-24 NOTE — Patient Instructions (Signed)
We are checking labs and imaging to rule out gallbladder, liver, kidney, appendix, diverticulitis or abcess  Please do not eat tomorrow morning prior to CT (we will call you to schedule this hopefully this afternoon)  Please go to the ER if you have any severe AB pain, unable to hold down food/water, blood in stool or vomit, chest pain, shortness of breath, or any worsening symptoms.     Abdominal Pain, Adult  Many things can cause belly (abdominal) pain. Most times, belly pain is not dangerous. Many cases of belly pain can be watched and treated at home. Sometimes belly pain is serious, though. Your doctor will try to find the cause of your belly pain. Follow these instructions at home:  Take over-the-counter and prescription medicines only as told by your doctor. Do not take medicines that help you poop (laxatives) unless told to by your doctor.  Drink enough fluid to keep your pee (urine) clear or pale yellow.  Watch your belly pain for any changes.  Keep all follow-up visits as told by your doctor. This is important. Contact a doctor if:  Your belly pain changes or gets worse.  You are not hungry, or you lose weight without trying.  You are having trouble pooping (constipated) or have watery poop (diarrhea) for more than 2-3 days.  You have pain when you pee or poop.  Your belly pain wakes you up at night.  Your pain gets worse with meals, after eating, or with certain foods.  You are throwing up and cannot keep anything down.  You have a fever. Get help right away if:  Your pain does not go away as soon as your doctor says it should.  You cannot stop throwing up.  Your pain is only in areas of your belly, such as the right side or the left lower part of the belly.  You have bloody or black poop, or poop that looks like tar.  You have very bad pain, cramping, or bloating in your belly.  You have signs of not having enough fluid or water in your body (dehydration),  such as: ? Dark pee, very little pee, or no pee. ? Cracked lips. ? Dry mouth. ? Sunken eyes. ? Sleepiness. ? Weakness. This information is not intended to replace advice given to you by your health care provider. Make sure you discuss any questions you have with your health care provider. Document Released: 09/01/2007 Document Revised: 10/03/2015 Document Reviewed: 08/27/2015 Elsevier Interactive Patient Education  2019 [employer].

## 2018-05-25 ENCOUNTER — Ambulatory Visit
Admission: RE | Admit: 2018-05-25 | Discharge: 2018-05-25 | Disposition: A | Discharge status: Home / Self Care | Payer: Medicare Other | Source: Ambulatory Visit | Attending: Adult Health | Admitting: Adult Health

## 2018-05-25 ENCOUNTER — Other Ambulatory Visit: Payer: Self-pay

## 2018-05-25 ENCOUNTER — Other Ambulatory Visit: Payer: Self-pay | Admitting: Adult Health

## 2018-05-25 DIAGNOSIS — R10813 Right lower quadrant abdominal tenderness: Secondary | ICD-10-CM

## 2018-05-25 DIAGNOSIS — J301 Allergic rhinitis due to pollen: Secondary | ICD-10-CM | POA: Diagnosis not present

## 2018-05-25 DIAGNOSIS — J3081 Allergic rhinitis due to animal (cat) (dog) hair and dander: Secondary | ICD-10-CM | POA: Diagnosis not present

## 2018-05-25 DIAGNOSIS — J3089 Other allergic rhinitis: Secondary | ICD-10-CM | POA: Diagnosis not present

## 2018-05-25 DIAGNOSIS — R109 Unspecified abdominal pain: Secondary | ICD-10-CM | POA: Diagnosis not present

## 2018-05-25 DIAGNOSIS — R1031 Right lower quadrant pain: Secondary | ICD-10-CM

## 2018-05-25 LAB — CBC WITH DIFFERENTIAL/PLATELET
Absolute Monocytes: 369 cells/uL (ref 200–950)
Basophils Absolute: 18 cells/uL (ref 0–200)
Basophils Relative: 0.4 %
Eosinophils Absolute: 50 cells/uL (ref 15–500)
Eosinophils Relative: 1.1 %
HCT: 39.1 % (ref 35.0–45.0)
Hemoglobin: 12.8 g/dL (ref 11.7–15.5)
Lymphs Abs: 2052 cells/uL (ref 850–3900)
MCH: 26.3 pg — ABNORMAL LOW (ref 27.0–33.0)
MCHC: 32.7 g/dL (ref 32.0–36.0)
MCV: 80.3 fL (ref 80.0–100.0)
MPV: 10.1 fL (ref 7.5–12.5)
Monocytes Relative: 8.2 %
Neutro Abs: 2012 cells/uL (ref 1500–7800)
Neutrophils Relative %: 44.7 %
Platelets: 323 10*3/uL (ref 140–400)
RBC: 4.87 10*6/uL (ref 3.80–5.10)
RDW: 12.9 % (ref 11.0–15.0)
Total Lymphocyte: 45.6 %
WBC: 4.5 10*3/uL (ref 3.8–10.8)

## 2018-05-25 LAB — URINALYSIS, ROUTINE W REFLEX MICROSCOPIC
Bilirubin Urine: NEGATIVE
Glucose, UA: NEGATIVE
Hgb urine dipstick: NEGATIVE
Ketones, ur: NEGATIVE
Leukocytes,Ua: NEGATIVE
Nitrite: NEGATIVE
Protein, ur: NEGATIVE
Specific Gravity, Urine: 1.024 (ref 1.001–1.03)
pH: <=5 (ref 5.0–8.0)

## 2018-05-25 LAB — COMPLETE METABOLIC PANEL WITH GFR
AG Ratio: 1.9 (calc) (ref 1.0–2.5)
ALT: 25 U/L (ref 6–29)
AST: 21 U/L (ref 10–35)
Albumin: 4.7 g/dL (ref 3.6–5.1)
Alkaline phosphatase (APISO): 72 U/L (ref 31–125)
BUN: 7 mg/dL (ref 7–25)
CO2: 28 mmol/L (ref 20–32)
Calcium: 10 mg/dL (ref 8.6–10.2)
Chloride: 104 mmol/L (ref 98–110)
Creat: 0.76 mg/dL (ref 0.50–1.10)
GFR, Est African American: 108 mL/min/{1.73_m2} (ref >=60)
GFR, Est Non African American: 93 mL/min/{1.73_m2} (ref >=60)
Globulin: 2.5 g/dL (calc) (ref 1.9–3.7)
Glucose, Bld: 84 mg/dL (ref 65–99)
Potassium: 4.2 mmol/L (ref 3.5–5.3)
Sodium: 140 mmol/L (ref 135–146)
Total Bilirubin: 0.5 mg/dL (ref 0.2–1.2)
Total Protein: 7.2 g/dL (ref 6.1–8.1)

## 2018-05-25 LAB — LIPASE: Lipase: 21 U/L (ref 7–60)

## 2018-05-25 LAB — AMYLASE: Amylase: 74 U/L (ref 21–101)

## 2018-05-29 DIAGNOSIS — J3089 Other allergic rhinitis: Secondary | ICD-10-CM | POA: Diagnosis not present

## 2018-05-29 DIAGNOSIS — J301 Allergic rhinitis due to pollen: Secondary | ICD-10-CM | POA: Diagnosis not present

## 2018-05-29 DIAGNOSIS — J3081 Allergic rhinitis due to animal (cat) (dog) hair and dander: Secondary | ICD-10-CM | POA: Diagnosis not present

## 2018-06-02 DIAGNOSIS — J301 Allergic rhinitis due to pollen: Secondary | ICD-10-CM | POA: Diagnosis not present

## 2018-06-02 DIAGNOSIS — J3089 Other allergic rhinitis: Secondary | ICD-10-CM | POA: Diagnosis not present

## 2018-06-02 DIAGNOSIS — J3081 Allergic rhinitis due to animal (cat) (dog) hair and dander: Secondary | ICD-10-CM | POA: Diagnosis not present

## 2018-06-08 DIAGNOSIS — Z79899 Other long term (current) drug therapy: Secondary | ICD-10-CM | POA: Diagnosis not present

## 2018-06-08 DIAGNOSIS — G894 Chronic pain syndrome: Secondary | ICD-10-CM | POA: Diagnosis not present

## 2018-06-08 DIAGNOSIS — J301 Allergic rhinitis due to pollen: Secondary | ICD-10-CM | POA: Diagnosis not present

## 2018-06-08 DIAGNOSIS — Z79891 Long term (current) use of opiate analgesic: Secondary | ICD-10-CM | POA: Diagnosis not present

## 2018-06-08 DIAGNOSIS — J3081 Allergic rhinitis due to animal (cat) (dog) hair and dander: Secondary | ICD-10-CM | POA: Diagnosis not present

## 2018-06-08 DIAGNOSIS — G629 Polyneuropathy, unspecified: Secondary | ICD-10-CM | POA: Diagnosis not present

## 2018-06-08 DIAGNOSIS — J3089 Other allergic rhinitis: Secondary | ICD-10-CM | POA: Diagnosis not present

## 2018-06-08 DIAGNOSIS — M549 Dorsalgia, unspecified: Secondary | ICD-10-CM | POA: Diagnosis not present

## 2018-06-19 ENCOUNTER — Encounter (INDEPENDENT_AMBULATORY_CARE_PROVIDER_SITE_OTHER): Payer: Self-pay | Admitting: Internal Medicine

## 2018-06-19 ENCOUNTER — Ambulatory Visit (INDEPENDENT_AMBULATORY_CARE_PROVIDER_SITE_OTHER): Payer: Medicare Other | Admitting: Internal Medicine

## 2018-06-19 ENCOUNTER — Ambulatory Visit: Payer: Self-pay | Admitting: Physician Assistant

## 2018-06-19 ENCOUNTER — Other Ambulatory Visit: Payer: Self-pay

## 2018-06-19 VITALS — BP 124/81 | HR 58 | Temp 98.2°F | Resp 18 | Ht 66.0 in | Wt 149.6 lb

## 2018-06-19 DIAGNOSIS — K219 Gastro-esophageal reflux disease without esophagitis: Secondary | ICD-10-CM | POA: Diagnosis not present

## 2018-06-19 DIAGNOSIS — R1011 Right upper quadrant pain: Secondary | ICD-10-CM

## 2018-06-19 DIAGNOSIS — R1031 Right lower quadrant pain: Secondary | ICD-10-CM

## 2018-06-19 NOTE — Progress Notes (Signed)
Presenting complaint;  Follow-up for abdominal pain constipation and GERD.  Subjective:  Patient is 48 year old African-American female who has chronic constipation chronic right lower quadrant abdominal pain as well as GERD who is here for scheduled visit.  She was last seen in May 2019.  She says right lower quadrant abdominal pain has not changed.  She has this pain once or twice a week or more often.  Pressure in this area makes the pain worse.  Pain is usually mild and not associated with nausea vomiting diarrhea bleeding hematuria.  She has not been able to pinpoint any triggers.  She states pain resolved for a few months after she had hysterectomy in August 2018 but then has returned.  She says her bowels are moving better than they have been in the past.  She does not take Senokot very often.  She she states she does not pass much flatus however she does not have any gas pains.  She is watching her diet.  Since her last visit she has gained 13 pounds.  TSH in January 2020 was normal. Her new complaint is right upper quadrant abdominal pain she has had this pain for for the past past few months.  She may have this pain every few weeks.  Last episode occurred 10 days ago while she was very comfortable in a reclining chair watching TV.  She had sharp pain under the right rib cage and thought she was going to pass out.  She did not experience diaphoreses nausea or vomiting.  Pain lasted for 2 to 3 minutes.  She denies postprandial nausea vomiting or upper abdominal pain. She remains very active.  She goes to the gym at least 3 times a week.  Current Medications: Outpatient Encounter Medications as of 06/19/2018  Medication Sig  . albuterol (PROAIR HFA) 108 (90 Base) MCG/ACT inhaler Inhale 2 puffs into the lungs every 6 (six) hours as needed for wheezing or shortness of breath.  Marland Kitchen aspirin EC 81 MG tablet Take 81 mg by mouth daily.  . cetirizine (ZYRTEC) 10 MG tablet Take 10 mg by mouth every  evening.  . fluticasone (CUTIVATE) 0.05 % cream Apply 1 application topically 2 (two) times daily.   . Fluticasone-Salmeterol (ADVAIR DISKUS) 250-50 MCG/DOSE AEPB Inhale 1 puff into the lungs every 12 (twelve) hours.  . gabapentin (NEURONTIN) 800 MG tablet Take 800 mg by mouth 3 (three) times daily.  Marland Kitchen HYDROcodone-acetaminophen (NORCO) 7.5-325 MG tablet Take 1 tablet by mouth. Takes 1 tablet 3 times a day.  . methocarbamol (ROBAXIN) 500 MG tablet Take 1 tablet (500 mg total) by mouth 3 (three) times daily as needed for muscle spasms.  . montelukast (SINGULAIR) 10 MG tablet Take 1 tablet (10 mg total) by mouth at bedtime.  . Olopatadine HCl (PATADAY) 0.2 % SOLN Apply to each eye as needed  . OVER THE COUNTER MEDICATION Fiber laxative 2 tsp twice a day.  . senna (SENOKOT) 8.6 MG tablet Take 1 tablet by mouth daily as needed.   . venlafaxine XR (EFFEXOR XR) 37.5 MG 24 hr capsule Take 1 capsule (37.5 mg total) by mouth daily with breakfast.  . Vitamin D, Ergocalciferol, (DRISDOL) 50000 units CAPS capsule Take 1 capsule (50,000 Units total) by mouth every 3 (three) days.  Marland Kitchen omeprazole (PRILOSEC) 40 MG capsule Take 1 capsule (40 mg total) by mouth daily. (Patient not taking: Reported on 06/19/2018)   No facility-administered encounter medications on file as of 06/19/2018.      Objective:  Blood pressure 124/81, pulse (!) 58, temperature 98.2 F (36.8 C), temperature source Oral, resp. rate 18, height _0  (1.676 m), weight 149 lb 9.6 oz (67.9 kg), last menstrual period 02/13/2016. Patient is alert and in no acute distress. Conjunctiva is pink. Sclera is nonicteric Oropharyngeal mucosa is normal. No neck masses or thyromegaly noted. Cardiac exam with regular rhythm normal S1 and S2. No murmur or gallop noted. Lungs are clear to auscultation. Abdomen is symmetrical.  Bowel sounds are normal.  Percussion note is somewhat more tympanitic and right mid abdomen compared to the left side.  Abdomen is  soft.  She has mild tenderness at RLQ without guarding or rebound.  No organomegaly or masses. No LE edema or clubbing noted.  Labs/studies Results:   CBC Latest Ref Rng & Units 05/24/2018 03/30/2018 12/22/2017  WBC 3.8 - 10.8 Thousand/uL 4.5 3.7(L) 4.6  Hemoglobin 11.7 - 15.5 g/dL 12.8 12.7 12.7  Hematocrit 35.0 - 45.0 % 39.1 39.7 40.1  Platelets 140 - 400 Thousand/uL 323 356 328    CMP Latest Ref Rng & Units 05/24/2018 03/30/2018 12/22/2017  Glucose 65 - 99 mg/dL 84 77 90  BUN 7 - 25 mg/dL _1 Creatinine 0.50 - 1.10 mg/dL 0.76 0.72 0.85  Sodium 135 - 146 mmol/L 140 137 136  Potassium 3.5 - 5.3 mmol/L 4.2 4.1 4.3  Chloride 98 - 110 mmol/L 104 101 103  CO2 20 - 32 mmol/L _2 Calcium 8.6 - 10.2 mg/dL 10.0 9.6 9.7  Total Protein 6.1 - 8.1 g/dL 7.2 7.2 7.2  Total Bilirubin 0.2 - 1.2 mg/dL 0.5 0.6 0.5  Alkaline Phos 33 - 115 U/L - - -  AST 10 - 35 U/L _3 ALT 6 - 29 U/L 25 30(H) 11    Hepatic Function Latest Ref Rng & Units 05/24/2018 03/30/2018 12/22/2017  Total Protein 6.1 - 8.1 g/dL 7.2 7.2 7.2  Albumin 3.6 - 5.1 g/dL - - -  AST 10 - 35 U/L _4 ALT 6 - 29 U/L 25 30(H) 11  Alk Phosphatase 33 - 115 U/L - - -  Total Bilirubin 0.2 - 1.2 mg/dL 0.5 0.6 0.5  Bilirubin, Direct 0.0 - 0.2 mg/dL - - -      Assessment:  #1.  Right lower quadrant abdominal pain.  She has had this pain for the past few years.  Examination is unremarkable except for mild tenderness at right lower quadrant without any guarding.  This pain is possibly due to IBS in association with constipation but also could be part and parcel of her Charcot-Marie-Tooth disease.  She will try OTC lidocaine patch.  #2.  Right upper quadrant abdominal pain.  This is a new symptom.  This pain appears to be neuropathic pain and once again may be related to her neurologic disorder.  Recent LFTs  normal.  CT last month did not show any abnormality to hepatobiliary and pancreatic organs.  Will monitor this pain.  If pain  is postprandial will consider right upper quadrant abdominal ultrasound.  #3.  Chronic GERD.  She is doing well with lifestyle modifications and PRN omeprazole.  Dose will be decreased further when the current prescription runs out.  #4.  Chronic constipation.  She is doing better with dietary measures and sporadic use of laxative.   Plan:  Patient will continue using omeprazole 40 mg daily PRN. She will call office when current prescription runs out at which time dose  will be decreased to 20 mg daily PRN. Regarding right lower quadrant abdominal pain she will try OTC lidocaine patch 12 hours on and 12 hours off.  She can try this patch daily for few days and see if it works and if it does then she can use it on as-needed basis when pain is more intense. Regarding her fleeting right upper quadrant abdominal pain she will bring it to the attention of her neurologist and see if there are any new recommendations. Office visit in 1 year.

## 2018-06-19 NOTE — Patient Instructions (Signed)
Can use omeprazole on as-needed basis.  When current prescription runs out will decrease dose to 20 mg. Notify if you experience right upper quadrant abdominal pain after meals.  Can use OTC lidocaine patch for RLQ abdominal pain.  If it works will use prescription medication.

## 2018-06-23 DIAGNOSIS — J3081 Allergic rhinitis due to animal (cat) (dog) hair and dander: Secondary | ICD-10-CM | POA: Diagnosis not present

## 2018-06-23 DIAGNOSIS — J3089 Other allergic rhinitis: Secondary | ICD-10-CM | POA: Diagnosis not present

## 2018-06-23 DIAGNOSIS — J301 Allergic rhinitis due to pollen: Secondary | ICD-10-CM | POA: Diagnosis not present

## 2018-06-26 DIAGNOSIS — J3081 Allergic rhinitis due to animal (cat) (dog) hair and dander: Secondary | ICD-10-CM | POA: Diagnosis not present

## 2018-06-26 DIAGNOSIS — J3089 Other allergic rhinitis: Secondary | ICD-10-CM | POA: Diagnosis not present

## 2018-06-26 DIAGNOSIS — R0781 Pleurodynia: Secondary | ICD-10-CM | POA: Diagnosis not present

## 2018-06-26 DIAGNOSIS — J4520 Mild intermittent asthma, uncomplicated: Secondary | ICD-10-CM | POA: Diagnosis not present

## 2018-06-26 DIAGNOSIS — J301 Allergic rhinitis due to pollen: Secondary | ICD-10-CM | POA: Diagnosis not present

## 2018-06-30 DIAGNOSIS — J3081 Allergic rhinitis due to animal (cat) (dog) hair and dander: Secondary | ICD-10-CM | POA: Diagnosis not present

## 2018-06-30 DIAGNOSIS — J301 Allergic rhinitis due to pollen: Secondary | ICD-10-CM | POA: Diagnosis not present

## 2018-06-30 DIAGNOSIS — J3089 Other allergic rhinitis: Secondary | ICD-10-CM | POA: Diagnosis not present

## 2018-07-03 DIAGNOSIS — J3089 Other allergic rhinitis: Secondary | ICD-10-CM | POA: Diagnosis not present

## 2018-07-03 DIAGNOSIS — J3081 Allergic rhinitis due to animal (cat) (dog) hair and dander: Secondary | ICD-10-CM | POA: Diagnosis not present

## 2018-07-03 DIAGNOSIS — J301 Allergic rhinitis due to pollen: Secondary | ICD-10-CM | POA: Diagnosis not present

## 2018-07-04 ENCOUNTER — Encounter: Payer: Self-pay | Admitting: Internal Medicine

## 2018-07-05 DIAGNOSIS — J3081 Allergic rhinitis due to animal (cat) (dog) hair and dander: Secondary | ICD-10-CM | POA: Diagnosis not present

## 2018-07-05 DIAGNOSIS — J301 Allergic rhinitis due to pollen: Secondary | ICD-10-CM | POA: Diagnosis not present

## 2018-07-05 DIAGNOSIS — J3089 Other allergic rhinitis: Secondary | ICD-10-CM | POA: Diagnosis not present

## 2018-07-05 NOTE — Progress Notes (Deleted)
MEDICARE ANNUAL WELLNESS VISIT AND FU  Assessment:   Syncope, unspecified syncope type -     EKG 12-Lead- sinus brady no changes -     Ambulatory referral to Cardiology -     Ambulatory referral to Neurology - ? Need EEG/sleep study versus migraine versus anxiety/depression- wants second opinon about HA/syncopal episodes, patient believes it is related to cyst but last MRI shows unchanged in size and no mass effect. Start on effexor for depression/anxiety and migraine prevention.  - patient with bradycardia, abnormal heart beats and questionable syncope  Nonintractable headache, unspecified chronicity pattern, unspecified headache type -     Ambulatory referral to Neurology -     venlafaxine XR (EFFEXOR XR) 37.5 MG 24 hr capsule; 1 tab for 1-2 weeks and then go up to 2 in AM. -     methocarbamol (ROBAXIN) 500 MG tablet; Take 1 tablet (500 mg total) by mouth 3 (three) times daily as needed for muscle spasms. - check ESR, CBC- ? 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 From neck pain- continue meds at this time  Neck pain -     gabapentin (NEURONTIN) 600 MG tablet; Take 1 tablet (600 mg total) by mouth 3 (three) times daily. -     methocarbamol (ROBAXIN) 500 MG tablet; Take 1 tablet (500 mg total) by mouth 3 (three) times daily as needed for muscle spasms.  CMT (Charcot-Marie-Tooth disease) Continue follow up with pain management, neuro  Other fatigue Recent normal labs  Mixed hyperlipidemia -continue medications, check lipids, decrease fatty foods, increase activity.   Medication management  Hypertension, unspecified type - continue medications, DASH diet, exercise and monitor at home. Call if greater than 130/80.   Prediabetes Better with weight loss  Pain in joint, multiple sites -     gabapentin (NEURONTIN) 600 MG tablet; Take 1 tablet (600 mg total) by mouth 3 (three)  times daily. -     methocarbamol (ROBAXIN) 500 MG tablet; Take 1 tablet (500 mg total) by mouth 3 (three) times daily as needed for muscle spasms. - continue follow up pain management  Vitamin D deficiency -     Vitamin D, Ergocalciferol, (DRISDOL) 50000 units CAPS capsule; Take 1 capsule (50,000 Units total) by mouth every 3 (three) days. - will cut back  Other constipation Get back on miralax  Gastroesophageal reflux disease, esophagitis presence not specified -     omeprazole (PRILOSEC) 40 MG capsule; Take 1 capsule (40 mg total) by mouth daily.  Chronic asthma without complication, unspecified asthma severity, unspecified whether persistent -     albuterol (PROAIR HFA) 108 (90 Base) MCG/ACT inhaler; Inhale 2 puffs into the lungs every 6 (six) hours as needed for wheezing or shortness of breath. -     Fluticasone-Salmeterol (ADVAIR DISKUS) 250-50 MCG/DOSE AEPB; Inhale 1 puff into the lungs every 12 (twelve) hours. -     montelukast (SINGULAIR) 10 MG tablet; Take 1 tablet (10 mg total) by mouth at bedtime. -     Olopatadine HCl (PATADAY) 0.2 % SOLN; Apply to each eye as needed  Encounter for Medicare annual wellness exam 1 year Get MGM  BMI 22.0-22.9, adult monitoir  Advanced care planning/counseling discussion Papers given to patient to fill out, importance discussed  Gastroesophageal reflux disease without esophagitis -     omeprazole (PRILOSEC) 40 MG capsule; Take 1 capsule (40 mg total) by  mouth daily.     Over 40 minutes of exam, counseling, chart review and critical decision making was performed Future Appointments  Date Time Provider [place]  07/06/2018 11:15 AM Vicie Mutters, PA-C [facility] None  10/05/2018  2:00 PM Unk Pinto, MD [facility] None  06/19/2019 11:00 AM Rehman, Mechele Dawley, MD [facility] None     Plan:   During the course of the visit the patient was educated and counseled about appropriate screening and preventive services including:     Pneumococcal vaccine   Prevnar 13  Influenza vaccine  Td vaccine  Screening electrocardiogram  Bone densitometry screening  Colorectal cancer screening  Diabetes screening  Glaucoma screening  Nutrition counseling   Advanced directives: requested   Subjective:  Brandi Erickson is a 48 y.o. female who presents for Medicare Annual Wellness Visit and follow up for predM, chol, HTN.  Her blood pressure has been controlled at home, today their BP is    She has chronic pain, follows with Dr. Andree Elk preferred pain. Has history of CMT, neck pain. 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  She has had fever, chills, sinus issues.  Head at top was sore to touch, she had some imbalance, and did not want to get out of bed. She has bilateral neck pain, no pain down her arms, she has TMJ history but does not have a night guard, just had implant done on right upper tooth. Has had increased stress with divorce, per patient she was defending herself from her husband and he charged her with assault, had to go to jail but currently that charge is being dropped.   She also states she has had "years" of syncopal episodes, very sporadic. She states she will just feel extreme exhaustion and that "feeling you get when you black out" so she always makes it to a chair/bed to sleep it off for 3-4 hours, some confusion when she wakes up. She states she has had some chest pain and abnormal heart beats. Has never had EEG. She does have head/migraine history. Nothing provokes them. She will sometimes be able to hear things happening while she is "shut down" but states she can not respond.   Has history of hemorrhoids s/p surgery at [facility].  She has history of a large fibroid/enlarged uterus, negative endometrial biopsy,  seeing [facility].    She does not workout. She denies chest pain, shortness of breath, dizziness.  She is not on cholesterol medication and denies myalgias. Her cholesterol is at goal. The cholesterol last visit was:   Lab Results  Component Value Date   CHOL 186 03/30/2018   HDL 63 03/30/2018   LDLCALC 107 (H) 03/30/2018   TRIG 70 03/30/2018   CHOLHDL 3.0 03/30/2018    She has been working on diet and exercise for prediabetes, and denies polydipsia, polyuria and visual disturbances. Last A1C in the office was:  Lab Results  Component Value Date   HGBA1C 5.5 12/22/2017   Patient is on Vitamin D supplement.   Lab Results  Component Value Date   VD25OH 66 03/30/2018     BMI is There is no height or weight on file to calculate BMI., she is working on diet and exercise. Wt Readings from Last 3 Encounters:  06/19/18 149 lb 9.6 oz (67.9 kg)  03/30/18 144 lb 12.8 oz (65.7 kg)  12/22/17 140 lb (63.5 kg)     Medication Review: Current Outpatient Medications on File Prior to Visit  Medication Sig Dispense Refill  . albuterol (PROAIR HFA) 108 (90 Base) MCG/ACT inhaler Inhale 2 puffs into the lungs every 6 (six) hours as needed for wheezing or shortness of breath. 3 Inhaler 4  . aspirin EC 81 MG tablet Take 81 mg by mouth daily.    . cetirizine (ZYRTEC) 10 MG tablet Take 10 mg by mouth every evening.    . fluticasone (CUTIVATE) 0.05 % cream Apply 1 application topically 2 (two) times daily.     . Fluticasone-Salmeterol (ADVAIR DISKUS) 250-50 MCG/DOSE AEPB Inhale 1 puff into the lungs every 12 (twelve) hours. 180 each 3  . gabapentin (NEURONTIN) 800 MG tablet Take 800 mg by mouth 3 (three) times daily.    Marland Kitchen HYDROcodone-acetaminophen (NORCO) 7.5-325 MG tablet Take 1 tablet by mouth. Takes 1 tablet 3 times a day.    . methocarbamol (ROBAXIN) 500 MG tablet Take 1 tablet (500 mg total) by mouth 3 (three) times daily as needed for muscle spasms. 270 tablet 1  . montelukast (SINGULAIR) 10 MG  tablet Take 1 tablet (10 mg total) by mouth at bedtime. 90 tablet 1  . Olopatadine HCl (PATADAY) 0.2 % SOLN Apply to each eye as needed 3 Bottle 3  . omeprazole (PRILOSEC) 40 MG capsule Take 1 capsule (40 mg total) by mouth daily. (Patient taking differently: Take 40 mg by mouth daily as needed. ) 90 capsule 3  . OVER THE COUNTER MEDICATION Fiber laxative 2 tsp twice a day.    . senna (SENOKOT) 8.6 MG tablet Take 1 tablet by mouth daily as needed.     . venlafaxine XR (EFFEXOR XR) 37.5 MG 24 hr capsule Take 1 capsule (37.5 mg total) by mouth daily with breakfast. 90 capsule 1  . Vitamin D, Ergocalciferol, (DRISDOL) 50000 units CAPS capsule Take 1 capsule (50,000 Units total) by mouth every 3 (three) days. 90 capsule 0   No current facility-administered medications on file prior to visit.     Allergies  Allergen Reactions  . Linzess [Linaclotide] Rash  . Iodinated Diagnostic Agents Nausea And Vomiting    MRI dye  nausea  . Oxycontin [Oxycodone Hcl] Nausea And Vomiting  . Penicillins Hives  . Shellfish Allergy Other (See Comments)  . Sulfa Antibiotics Hives  . Topamax [Topiramate] Hives    Current Problems (verified) Patient Active Problem List   Diagnosis Date Noted  . Anxiety 09/14/2017  . Syncope 06/17/2017  . Pain of breast 06/16/2017  . Mixed hyperlipidemia 09/13/2014  . Abnormal glucose 09/13/2014  . Other fatigue 09/11/2014  . Vitamin D deficiency 09/11/2014  . Pain in joint, multiple sites 09/11/2014  . Medication management 09/11/2014  . GERD 08/14/2012  . Asthma, chronic 08/14/2012  . CMT (Charcot-Marie-Tooth disease) 08/14/2012  . Constipation 08/14/2012  . Difficulty swallowing 06/25/2011    Screening Tests Immunization History  Administered Date(s) Administered  . PPD Test 08/26/2015, 09/16/2016  . Tdap 09/16/2016    Preventative care: Last colonoscopy: N/A due age 52 EGD 2013 Last mammogram: 2017 DUE given number Last pap smear/pelvic exam: s/p  hysterectomy DEXA:N/A MRI [facility] 08/2016 normal- no change in pineal cyst.   Prior vaccinations: TD or Tdap: 2018  Influenza: declines Pneumococcal: N/A Prevnar13: N/A Shingles/Zostavax: N/A  Names of Other Physician/Practitioners you currently use: 1. [facility] here for primary care 2. Dr. Katy Fitch, eye doctor, last visit 2018, has ov next week 3. Dr. Posey Pronto, dentist, last visit q 6 months Patient Care Team: Unk Pinto, MD as PCP - General (Internal Medicine) Blanch Media, MD as Consulting Physician (Neurology) Renie Ora, MD as Consulting Physician (Anesthesiology) Wylene Simmer, MD as Consulting Physician (Orthopedic Surgery) Susa Day, MD as Consulting Physician (Orthopedic Surgery) Rogene Houston, MD as Consulting Physician (Gastroenterology)  SURGICAL HISTORY She  has a past surgical history that includes Esophageal manometry (05/17/2011); Tonsillectomy; tubal reconstruction (1997); Tubal ligation (1999); Dilation and curettage of uterus; Colonoscopy (N/A, 11/01/2012); Foot neuroma surgery (Right, 06-05-2013); Tonsillectomy (as child); Hysteroscopy w/D&C (N/A, 06/27/2013); laparoscopy (N/A, 06/27/2013); and Laparoscopic total hysterectomy (2018). FAMILY HISTORY Her family history includes Brain cancer in her sister; Breast cancer in her sister. SOCIAL HISTORY She  reports that she has never smoked. She has never used smokeless tobacco. She reports that she does not drink alcohol or use drugs.  MEDICARE WELLNESS OBJECTIVES: Physical activity:   Cardiac risk factors:   Depression/mood screen:   Depression screen [facility] 2/9 12/25/2017  Decreased Interest 0  Down, Depressed, Hopeless 0  PHQ - 2 Score 0    ADLs:  In your present state of health, do you have any difficulty performing the following activities: 12/25/2017  Hearing? N  Vision? N  Difficulty concentrating or making decisions? N  Walking or climbing stairs? N   Dressing or bathing? N  Doing errands, shopping? N  Some recent data might be hidden     Cognitive Testing  Alert? Yes  Normal Appearance?Yes  Oriented to person? Yes  Place? Yes   Time? Yes  Recall of three objects?  Yes  Can perform simple calculations? Yes  Displays appropriate judgment?Yes  Can read the correct time from a watch face?Yes  EOL planning:    Review of Systems  Constitutional: Positive for malaise/fatigue. Negative for chills, diaphoresis, fever and weight loss.  HENT: Positive for congestion. Negative for ear discharge, ear pain, hearing loss, nosebleeds, sore throat and tinnitus.   Eyes: Negative.   Respiratory: Negative for cough, hemoptysis, sputum production, shortness of breath, wheezing and stridor.   Cardiovascular: Negative for chest pain, palpitations, orthopnea, claudication and leg swelling.  Gastrointestinal: Positive for abdominal pain and constipation. Negative for blood in stool, diarrhea, heartburn, melena, nausea and vomiting.  Genitourinary: Negative.   Musculoskeletal: Positive for back pain, joint pain, myalgias and neck pain. Negative for falls.  Skin: Negative.  Negative for rash.  Neurological: Positive for headaches. Negative for dizziness, tingling, tremors, sensory change, speech change, focal weakness, seizures, loss of consciousness and weakness.  Psychiatric/Behavioral: Negative.  Negative for depression and memory loss. The patient does not have insomnia.      Objective:     There were no vitals filed for this visit. There is no height or weight on file to calculate BMI.  General appearance: alert, no distress, WD/WN, female HEENT: + frontal tenderness, + temple tenderness, + TMJ tenderness left side, normocephalic, sclerae anicteric, TMs pearly, nares patent, no discharge or erythema, pharynx normal Oral cavity: MMM, no lesions Neck: supple, no lymphadenopathy, no thyromegaly, no masses Heart: RRR, normal S1, S2, no  murmurs Lungs: CTA bilaterally, no wheezes, rhonchi, or rales Abdomen: +bs, soft, non tender, non distended, no masses, no hepatomegaly, no splenomegaly Musculoskeletal: nontender, no swelling, no obvious deformity Extremities: no edema, no cyanosis, no clubbing Pulses: 2+ symmetric, upper and lower extremities, normal  cap refill Neurological: alert, oriented x 3, CN2-12 intact, strength normal upper extremities and lower extremities, sensation normal throughout, DTRs 2+ throughout, no cerebellar signs, gait normal Psychiatric: normal affect, behavior normal, pleasant   Medicare Attestation I have personally reviewed: The patient's medical and social history Their use of alcohol, tobacco or illicit drugs Their current medications and supplements The patient's functional ability including ADLs,fall risks, home safety risks, cognitive, and hearing and visual impairment Diet and physical activities Evidence for depression or mood disorders  The patient's weight, height, BMI, and visual acuity have been recorded in the chart.  I have made referrals, counseling, and provided education to the patient based on review of the above and I have provided the patient with a written personalized care plan for preventive services.     Vicie Mutters, PA-C   07/05/2018

## 2018-07-06 ENCOUNTER — Ambulatory Visit: Payer: Self-pay | Admitting: Physician Assistant

## 2018-07-10 DIAGNOSIS — J301 Allergic rhinitis due to pollen: Secondary | ICD-10-CM | POA: Diagnosis not present

## 2018-07-10 DIAGNOSIS — J3089 Other allergic rhinitis: Secondary | ICD-10-CM | POA: Diagnosis not present

## 2018-07-10 DIAGNOSIS — J3081 Allergic rhinitis due to animal (cat) (dog) hair and dander: Secondary | ICD-10-CM | POA: Diagnosis not present

## 2018-07-10 NOTE — Progress Notes (Signed)
MEDICARE ANNUAL WELLNESS VISIT AND FU  Assessment:    CMT (Charcot-Marie-Tooth disease) Continue follow up with pain management, neuro  Other fatigue Check labs Suggest sleep study since patient does not have restorative sleep and HA ? Chronic fatigue/depression  Mixed hyperlipidemia -continue medications, check lipids, decrease fatty foods, increase activity.   Medication management  Hypertension, unspecified type - continue medications, DASH diet, exercise and monitor at home. Call if greater than 130/80.   Abnormal glucose monitor  Pain in joint, multiple sites. - continue follow up pain management  Vitamin D deficiency - will cut back  Other constipation Get back on miralax  Gastroesophageal reflux disease, esophagitis presence not specified -   Continue PPI/H2 blocker, diet discussed  Chronic asthma without complication, unspecified asthma severity, unspecified whether persistent -    Continue meds  Encounter for Medicare annual wellness exam 1 year Get MGM  BMI 24.0-24.9, adult - increase veggies, decrease carbs - long discussion about weight loss, diet, and exercise  Screening, anemia, deficiency, iron -     Iron,Total/Total Iron Binding Cap -     Ferritin -     Vitamin B12  Dermatitis -     tretinoin (RETIN-A) 0.05 % cream; Apply topically at bedtime. -     ketoconazole (NIZORAL) 2 % cream; Apply 1 application topically 2 (two) times daily.     Over 40 minutes of exam, counseling, chart review and critical decision making was performed Future Appointments  Date Time Provider Wanda  07/12/2018 10:00 AM Vicie Mutters, PA-C [facility] None  10/05/2018  2:00 PM Unk Pinto, MD [facility] None  06/19/2019 11:00 AM Rehman, Mechele Dawley, MD [facility] None     Plan:   During the course of the visit the patient was educated and counseled about appropriate screening and preventive services including:    Pneumococcal vaccine    Prevnar 13  Influenza vaccine  Td vaccine  Screening electrocardiogram  Bone densitometry screening  Colorectal cancer screening  Diabetes screening  Glaucoma screening  Nutrition counseling   Advanced directives: requested   Subjective:  Brandi Erickson is a 48 y.o. female who presents for Medicare Annual Wellness Visit and follow up for predM, chol, HTN.  She has chest, side of right head itchy, has been working out in the yard. X 2 weeks. She has put cortisone cream, helped some. She has never had a sleep study. She is on effexor 37.5mg  once a day.   She gets 7-10 hours of sleep, sometimes she will wake up not rested. She states that she is on black seed oil.   Her blood pressure has been controlled at home, today their BP is BP: 122/68  She has chronic pain, follows with Dr. Andree Elk preferred pain. Has history of CMT, neck pain. Has TMJ and has new mouth piece from dentist.  Has history of hemorrhoids s/p surgery at [place] [facility].    She does not workout, wants to try to start walking, was doing water aerobics but has not been able to. She denies chest pain, shortness of breath, dizziness.  She is not on cholesterol medication and denies myalgias. Her cholesterol is at goal. The cholesterol last visit was:   Lab Results  Component Value Date   CHOL 186 03/30/2018   HDL 63 03/30/2018   LDLCALC 107 (H) 03/30/2018   TRIG 70 03/30/2018   CHOLHDL 3.0 03/30/2018    She has been working on diet and exercise for prediabetes, and denies polydipsia, polyuria and visual  disturbances. Last A1C in the office was:  Lab Results  Component Value Date   HGBA1C 5.5 12/22/2017   Patient is on Vitamin D supplement.   Lab Results  Component Value Date   VD25OH 66 03/30/2018     BMI is Body mass index is 24.15 kg/m., she is working on diet and exercise. Wt Readings from Last 3 Encounters:  06/19/18 149 lb 9.6 oz (67.9 kg)  03/30/18 144 lb 12.8 oz (65.7 kg)  12/22/17 140 lb  (63.5 kg)     Medication Review: Current Outpatient Medications on File Prior to Visit  Medication Sig Dispense Refill  . albuterol (PROAIR HFA) 108 (90 Base) MCG/ACT inhaler Inhale 2 puffs into the lungs every 6 (six) hours as needed for wheezing or shortness of breath. 3 Inhaler 4  . aspirin EC 81 MG tablet Take 81 mg by mouth daily.    . cetirizine (ZYRTEC) 10 MG tablet Take 10 mg by mouth every evening.    . fluticasone (CUTIVATE) 0.05 % cream Apply 1 application topically 2 (two) times daily.     . Fluticasone-Salmeterol (ADVAIR DISKUS) 250-50 MCG/DOSE AEPB Inhale 1 puff into the lungs every 12 (twelve) hours. 180 each 3  . gabapentin (NEURONTIN) 800 MG tablet Take 800 mg by mouth 3 (three) times daily.    Marland Kitchen HYDROcodone-acetaminophen (NORCO) 7.5-325 MG tablet Take 1 tablet by mouth. Takes 1 tablet 3 times a day.    . methocarbamol (ROBAXIN) 500 MG tablet Take 1 tablet (500 mg total) by mouth 3 (three) times daily as needed for muscle spasms. 270 tablet 1  . montelukast (SINGULAIR) 10 MG tablet Take 1 tablet (10 mg total) by mouth at bedtime. 90 tablet 1  . Olopatadine HCl (PATADAY) 0.2 % SOLN Apply to each eye as needed 3 Bottle 3  . omeprazole (PRILOSEC) 40 MG capsule Take 1 capsule (40 mg total) by mouth daily. (Patient taking differently: Take 40 mg by mouth daily as needed. ) 90 capsule 3  . OVER THE COUNTER MEDICATION Fiber laxative 2 tsp twice a day.    . senna (SENOKOT) 8.6 MG tablet Take 1 tablet by mouth daily as needed.     . venlafaxine XR (EFFEXOR XR) 37.5 MG 24 hr capsule Take 1 capsule (37.5 mg total) by mouth daily with breakfast. 90 capsule 1  . Vitamin D, Ergocalciferol, (DRISDOL) 50000 units CAPS capsule Take 1 capsule (50,000 Units total) by mouth every 3 (three) days. 90 capsule 0   No current facility-administered medications on file prior to visit.     Allergies  Allergen Reactions  . Linzess [Linaclotide] Rash  . Iodinated Diagnostic Agents Nausea And Vomiting     MRI dye  nausea  . Oxycontin [Oxycodone Hcl] Nausea And Vomiting  . Penicillins Hives  . Shellfish Allergy Other (See Comments)  . Sulfa Antibiotics Hives  . Topamax [Topiramate] Hives    Current Problems (verified) Patient Active Problem List   Diagnosis Date Noted  . Anxiety 09/14/2017  . Syncope 06/17/2017  . Mixed hyperlipidemia 09/13/2014  . Abnormal glucose 09/13/2014  . Other fatigue 09/11/2014  . Vitamin D deficiency 09/11/2014  . Pain in joint, multiple sites 09/11/2014  . Medication management 09/11/2014  . GERD 08/14/2012  . Asthma, chronic 08/14/2012  . CMT (Charcot-Marie-Tooth disease) 08/14/2012  . Constipation 08/14/2012  . Difficulty swallowing 06/25/2011    Screening Tests Immunization History  Administered Date(s) Administered  . PPD Test 08/26/2015, 09/16/2016  . 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  Ct AB/pelvis 04/2018  Prior vaccinations: TD or Tdap: 2018  Influenza: declines willing to get next year Pneumococcal: N/A Prevnar13: N/A Shingles/Zostavax: N/A  Names of Other Physician/Practitioners you currently use: 1. [facility] here for primary care 2. Dr. Katy Fitch, eye doctor, last visit 2019 3. Dr. Tera Mater, dentist, last visit 2020 Patient Care Team: Unk Pinto, MD as PCP - General (Internal Medicine) Blanch Media, MD as Consulting Physician (Neurology) Renie Ora, MD as Consulting Physician (Anesthesiology) Wylene Simmer, MD as Consulting Physician (Orthopedic Surgery) Susa Day, MD as Consulting Physician (Orthopedic Surgery) Rogene Houston, MD as Consulting Physician (Gastroenterology)  SURGICAL HISTORY She  has a past surgical history that includes Esophageal manometry (05/17/2011);  Tonsillectomy; tubal reconstruction (1997); Tubal ligation (1999); Dilation and curettage of uterus; Colonoscopy (N/A, 11/01/2012); Foot neuroma surgery (Right, 06-05-2013); Tonsillectomy (as child); Hysteroscopy w/D&C (N/A, 06/27/2013); laparoscopy (N/A, 06/27/2013); and Laparoscopic total hysterectomy (2018). FAMILY HISTORY Her family history includes Brain cancer in her sister; Breast cancer in her sister. SOCIAL HISTORY She  reports that she has never smoked. She has never used smokeless tobacco. She reports that she does not drink alcohol or use drugs.  MEDICARE WELLNESS OBJECTIVES: Physical activity:   Cardiac risk factors:   Depression/mood screen:   Depression screen [facility] 2/9 07/12/2018  Decreased Interest 0  Down, Depressed, Hopeless 0  PHQ - 2 Score 0    ADLs:  In your present state of health, do you have any difficulty performing the following activities: 12/25/2017  Hearing? N  Vision? N  Difficulty concentrating or making decisions? N  Walking or climbing stairs? N  Dressing or bathing? N  Doing errands, shopping? N  Some recent data might be hidden     Cognitive Testing  Alert? Yes  Normal Appearance?Yes  Oriented to person? Yes  Place? Yes   Time? Yes  Recall of three objects?  Yes  Can perform simple calculations? Yes  Displays appropriate judgment?Yes  Can read the correct time from a watch face?Yes  EOL planning: Does Patient Have a Medical Advance Directive?: No Would patient like information on creating a medical advance directive?: Yes (MAU/Ambulatory/Procedural Areas - Information given)  Review of Systems  Constitutional: Positive for malaise/fatigue. Negative for chills, diaphoresis, fever and weight loss.  HENT: Negative for congestion, ear discharge, ear pain, hearing loss, nosebleeds, sore throat and tinnitus.   Eyes: Negative.   Respiratory: Negative for cough, hemoptysis, sputum production, shortness of breath, wheezing and stridor.   Cardiovascular:  Negative for chest pain, palpitations, orthopnea, claudication and leg swelling.  Gastrointestinal: Negative for abdominal pain, blood in stool, constipation, diarrhea, heartburn, melena, nausea and vomiting.  Genitourinary: Negative.   Musculoskeletal: Positive for back pain, joint pain, myalgias and neck pain. Negative for falls.  Skin: Negative.  Negative for rash.  Neurological: Positive for headaches. Negative for dizziness, tingling, tremors, sensory change, speech change, focal weakness, seizures, loss of consciousness and weakness.  Psychiatric/Behavioral: Negative.  Negative for depression and memory loss. The patient does not have insomnia.      Objective:     Today's Vitals   07/12/18 0939  BP: 122/68  Pulse: 67  Temp: 97.6 F (36.4 C)  SpO2: 97%  Height: 5\' 6"  (1.676 m)  PainSc: 4    Body mass index is 24.15 kg/m.  General appearance: alert, no distress, WD/WN, female HEENT:  normocephalic, sclerae anicteric, TMs pearly, nares patent, no discharge or erythema, pharynx normal Oral cavity: MMM, no lesions Neck: supple, no lymphadenopathy, no thyromegaly, no masses Heart: RRR, normal S1, S2, no murmurs Lungs: CTA bilaterally, no wheezes, rhonchi, or rales Abdomen: +bs, soft, non tender, non distended, no masses, no hepatomegaly, no splenomegaly Musculoskeletal: nontender, no swelling, no obvious deformity Extremities: no edema, no cyanosis, no clubbing Pulses: 2+ symmetric, upper and lower extremities, normal cap refill Neurological: alert, oriented x 3, CN2-12 intact, strength normal upper extremities and lower extremities, sensation normal throughout, DTRs 2+ throughout, no cerebellar signs, gait normal Psychiatric: normal affect, behavior normal, pleasant   Medicare Attestation I have personally reviewed: The patient's medical and social history Their use of alcohol, tobacco or illicit drugs Their current medications and supplements The patient's functional  ability including ADLs,fall risks, home safety risks, cognitive, and hearing and visual impairment Diet and physical activities Evidence for depression or mood disorders  The patient's weight, height, BMI, and visual acuity have been recorded in the chart.  I have made referrals, counseling, and provided education to the patient based on review of the above and I have provided the patient with a written personalized care plan for preventive services.     Vicie Mutters, PA-C   07/12/2018

## 2018-07-12 ENCOUNTER — Encounter: Payer: Self-pay | Admitting: Physician Assistant

## 2018-07-12 ENCOUNTER — Telehealth: Payer: Self-pay | Admitting: Physician Assistant

## 2018-07-12 ENCOUNTER — Telehealth: Payer: Self-pay

## 2018-07-12 ENCOUNTER — Ambulatory Visit (INDEPENDENT_AMBULATORY_CARE_PROVIDER_SITE_OTHER): Payer: Medicare Other | Admitting: Physician Assistant

## 2018-07-12 ENCOUNTER — Other Ambulatory Visit: Payer: Self-pay

## 2018-07-12 VITALS — BP 122/68 | HR 67 | Temp 97.6°F | Ht 66.0 in

## 2018-07-12 DIAGNOSIS — J45909 Unspecified asthma, uncomplicated: Secondary | ICD-10-CM

## 2018-07-12 DIAGNOSIS — F419 Anxiety disorder, unspecified: Secondary | ICD-10-CM

## 2018-07-12 DIAGNOSIS — Z79899 Other long term (current) drug therapy: Secondary | ICD-10-CM | POA: Diagnosis not present

## 2018-07-12 DIAGNOSIS — K219 Gastro-esophageal reflux disease without esophagitis: Secondary | ICD-10-CM

## 2018-07-12 DIAGNOSIS — R7309 Other abnormal glucose: Secondary | ICD-10-CM | POA: Diagnosis not present

## 2018-07-12 DIAGNOSIS — D649 Anemia, unspecified: Secondary | ICD-10-CM | POA: Diagnosis not present

## 2018-07-12 DIAGNOSIS — Z0001 Encounter for general adult medical examination with abnormal findings: Secondary | ICD-10-CM

## 2018-07-12 DIAGNOSIS — E782 Mixed hyperlipidemia: Secondary | ICD-10-CM | POA: Diagnosis not present

## 2018-07-12 DIAGNOSIS — R5383 Other fatigue: Secondary | ICD-10-CM

## 2018-07-12 DIAGNOSIS — E559 Vitamin D deficiency, unspecified: Secondary | ICD-10-CM

## 2018-07-12 DIAGNOSIS — K5909 Other constipation: Secondary | ICD-10-CM

## 2018-07-12 DIAGNOSIS — R6889 Other general symptoms and signs: Secondary | ICD-10-CM

## 2018-07-12 DIAGNOSIS — R1310 Dysphagia, unspecified: Secondary | ICD-10-CM

## 2018-07-12 DIAGNOSIS — M2550 Pain in unspecified joint: Secondary | ICD-10-CM

## 2018-07-12 DIAGNOSIS — Z130 Encounter for screening for diseases of the blood and blood-forming organs and certain disorders involving the immune mechanism: Secondary | ICD-10-CM

## 2018-07-12 DIAGNOSIS — Z0000 Encounter for general adult medical examination without abnormal findings: Secondary | ICD-10-CM

## 2018-07-12 DIAGNOSIS — G600 Hereditary motor and sensory neuropathy: Secondary | ICD-10-CM

## 2018-07-12 MED ORDER — TRETINOIN 0.025 % EX GEL: Freq: Every day | CUTANEOUS | 1 refills | Status: DC

## 2018-07-12 MED ORDER — KETOCONAZOLE 2 % EX CREA: 1.0000 "application " | TOPICAL_CREAM | Freq: Two times a day (BID) | CUTANEOUS | 0 refills | Status: DC

## 2018-07-12 MED ORDER — TRETINOIN 0.05 % EX CREA: TOPICAL_CREAM | Freq: Every day | CUTANEOUS | 0 refills | Status: DC

## 2018-07-12 NOTE — Telephone Encounter (Signed)
-----   Message from Vicie Mutters, [place] sent at 07/12/2018  3:00 PM EDT ----- Regarding: RE: med cost Contact: [phone] Sent in the gel to see if that is cheaper. Due to being on medicare they may not cover it.  Estill Bamberg ----- Message ----- From: Elenor Quinones, CMA Sent: 07/12/2018   1:23 PM EDT To: Vicie Mutters, PA-C Subject: med cost                                       Insurance does not cover TRETINOIN $105.0  Is there an alternative that she can try.  Please advise or send new Rx.

## 2018-07-12 NOTE — Telephone Encounter (Signed)
-----   Message from Elenor Quinones, [place] sent at 07/12/2018  1:23 PM EDT ----- Regarding: med cost Contact: [phone] Insurance does not cover TRETINOIN $105.0  Is there an alternative that she can try.  Please advise or send new Rx.

## 2018-07-12 NOTE — Patient Instructions (Addendum)
DEMETRIUS1- Mychart username [email or web address]  AN UPDATE FROM [facility] Remember information is changing on an hourly basis, we are doing our best to stay up to date on the information.  If you have a question or concern please contact our office.   Daily Quarantine Questions to lift the spirit and help the body... - Who am I checking on or connecting with today? - What expectations of "normal" am I letting go of today? - How am I getting outside today? - How am I expressing my creativity today? - How am I moving my body today? - What type of self-care am I practicing today? - What am I grateful for today?  What are the symptoms? There are many different symptoms reported such as diarrhea, loss of smell, muscle aches HOWEVER the most common associated with the virus at this time is FEVER, DRY COUGH, and SHORTNESS OF BREATH.   What is shortness of breath that is concerning? - If you are unable to talk in full sentences - if you are unable to take a full breath (not to deep) and hold for 20-25 seconds.  - If you are panting while walking.  Some shortness of breath is common with this virus. Please do not panic. The best thing to do with any symptoms is to call the office. We can see you on video or talk with you on the phone to best determine if you need to go to the ER or get tested.   What to do if you have symptoms or you feel that you have come in contact with someone that may be infected with coronavirus? Please stay at home.  Call our office or send a MyChart message.  Please remember the majority of cases are self-limited and do not require in hospital intervention.  There is no specific treatment for a mild case of the virus other than supportive care.  Please only go to the ER if your symptoms escalate such as worsening shortness of breath, chest pain, and confusion.   As we mentioned before we are continually getting new information and there  is data showing that even if you do not have symptoms you may be able to spread the virus. Therefore, the best thing to do is to stay home! Please limit your contact with people, be mindful of social distancing.   At this time we are STILL NOT accepting walk in appointments or sick visits.  We are still having asymptomatic patients come into the office however we are asking that you call the office from your car to notify that you are here and we can check you into your appointment via the telephone.  We will have a nurse call you when she is ready for you to come into the office, this way we will not have patients waiting in the waiting room and can be more efficient.   Virtual visits and Evisits are in full swing and so far the patients are really enjoying them For your convenience and to prevent transmission, we are now performing Evisits or telephone encounters for virtual visits for sick visits and regular scheduled follow up visits.  You can contact one of your providers through [facility], your patient portal at any time or call the office during business hours.   Medicare and insurances are covering this during this crisis.  This helps you because we are able to talk about your conditions, discuss things to monitor and do; in addition, it  also helps [place] as a practice. We are a small private practice and need our patients help to remain open by doing these visits.  So please consider doing a virtual visit rather than rescheduling completely!  PLEASE DO NOT CLICK THE EVISIT BUTTON ON MY CHART, INSTEAD CHOOSE TO MESSAGE YOUR PROVIDER AND SEND [place] A MESSAGE WITH YOUR ISSUES.   As a community we are no longer testing patients with mild symptoms.  If the symptoms are mild, we are NOT testing patients to conserve supplies and capacity so our health care workers can care for people who need medical attention even during the peak of the outbreak.  Patients with mild symptoms are being instructed to stay  at home and recover for 2 weeks.  You can stay in contact with [place] during that time by mychart, telephone calls or we are starting virtual office visits with video capability.   Mild symptoms include cough,fever WITHOUT any of the following symptoms: Difficulty breathing, chest discomfort, altered thinking and confusion.  Please notify us immediately if you have these symptoms.   We have also decided NOT to do testing in our office for coronavirus at this time though we are continually accessing the situation as needed and will notify you if this changes.    If after a mychart communication or telephone visit, we feel you need testing then we will put in an order for you to get tested at a specimen collection site through [facility].   We will not test you if you do not have symptoms or have not had potential contact at this time.  We will not test you if you just show up to the office, you need an appointment specifically for testing, please call or message first.   Please remember that this virus is happening at the same time as other respiratory viruses, such as influenza, common colds and now allergy season.   To reduce your risk of infection we recommend the same precautions as those used to avoid the common cold and flu virus  . Please wash your hands with soap and water for 20 seconds and frequently. Wendee Copp your hands before you eat or touch your face.  . Avoid touching your face, eyes, nose, and mouth as much as possible.  . if needing to cough or sneeze cover your mouth and nose by coughing or sneezing into your elbow area, your sleeve or a tissue . Routinely clean frequently touched items such as doorknobs, keyboards, and phones.  . Avoid crowds of people.  Marland Kitchen avoid shaking hands with others; . We recommend anyone within the high-risk demographics such as older adults and those with heart/lung disease, auto-immune or immune-suppressing health conditions to consider reduced  travel and public outings.  Please refer to the sources below for current updates, guidelines and recommendations.  You can call this hotline set up by [facility] hospital 1 877 40COVID 4068694071)  You can visit these websites: CDC.gov WHO.int    Fatigue If you have fatigue, you feel tired all the time and have a lack of energy or a lack of motivation. Fatigue may make it difficult to start or complete tasks because of exhaustion. In general, occasional or mild fatigue is often a normal response to activity or life. However, long-lasting (chronic) or extreme fatigue may be a symptom of a medical condition. Follow these instructions at home: General instructions  Watch your fatigue for any changes.  Go to bed and get up at the  same time every day.  Avoid fatigue by pacing yourself during the day and getting enough sleep at night.  Maintain a healthy weight. Medicines  Take over-the-counter and prescription medicines only as told by your health care provider.  Take a multivitamin, if told by your health care provider.  Do not use herbal or dietary supplements unless they are approved by your health care provider. Activity   Exercise regularly, as told by your health care provider.  Use or practice techniques to help you relax, such as yoga, tai chi, meditation, or massage therapy. Eating and drinking   Avoid heavy meals in the evening.  Eat a well-balanced diet, which includes lean proteins, whole grains, plenty of fruits and vegetables, and low-fat dairy products.  Avoid consuming too much caffeine.  Avoid the use of alcohol.  Drink enough fluid to keep your urine pale yellow. Lifestyle  Change situations that cause you stress. Try to keep your work and personal schedule in balance.  Do not use any products that contain nicotine or tobacco, such as cigarettes and e-cigarettes. If you need help quitting, ask your health care provider.  Do not use drugs. Contact a  health care provider if:  Your fatigue does not get better.  You have a fever.  You suddenly lose or gain weight.  You have headaches.  You have trouble falling asleep or sleeping through the night.  You feel angry, guilty, anxious, or sad.  You are unable to have a bowel movement (constipation).  Your skin is dry.  You have swelling in your legs or another part of your body. Get help right away if:  You feel confused.  Your vision is blurry.  You feel faint or you pass out.  You have a severe headache.  You have severe pain in your abdomen, your back, or the area between your waist and hips (pelvis).  You have chest pain, shortness of breath, or an irregular or fast heartbeat.  You are unable to urinate, or you urinate less than normal.  You have abnormal bleeding, such as bleeding from the rectum, vagina, nose, lungs, or nipples.  You vomit blood.  You have thoughts about hurting yourself or others. If you ever feel like you may hurt yourself or others, or have thoughts about taking your own life, get help right away. You can go to your nearest emergency department or call:  Your local emergency services (911 in the [place]  A suicide crisis helpline, such as the [facility] at 908 472 4945. This is open 24 hours a day. Summary  If you have fatigue, you feel tired all the time and have a lack of energy or a lack of motivation.  Fatigue may make it difficult to start or complete tasks because of exhaustion.  Long-lasting (chronic) or extreme fatigue may be a symptom of a medical condition.  Exercise regularly, as told by your health care provider.  Change situations that cause you stress. Try to keep your work and personal schedule in balance. This information is not intended to replace advice given to you by your health care provider. Make sure you discuss any questions you have with your health care provider. Document Released:  01/10/2007 Document Revised: 12/08/2016 Document Reviewed: 12/08/2016 Elsevier Interactive Patient Education  [employer].

## 2018-07-12 NOTE — Telephone Encounter (Signed)
PATIENT has been made aware

## 2018-07-13 LAB — CBC WITH DIFFERENTIAL/PLATELET
Absolute Monocytes: 359 cells/uL (ref 200–950)
Basophils Absolute: 19 cells/uL (ref 0–200)
Basophils Relative: 0.5 %
Eosinophils Absolute: 70 cells/uL (ref 15–500)
Eosinophils Relative: 1.9 %
HCT: 41 % (ref 35.0–45.0)
Hemoglobin: 12.9 g/dL (ref 11.7–15.5)
Lymphs Abs: 1598 cells/uL (ref 850–3900)
MCH: 25.5 pg — ABNORMAL LOW (ref 27.0–33.0)
MCHC: 31.5 g/dL — ABNORMAL LOW (ref 32.0–36.0)
MCV: 81 fL (ref 80.0–100.0)
MPV: 9.9 fL (ref 7.5–12.5)
Monocytes Relative: 9.7 %
Neutro Abs: 1654 cells/uL (ref 1500–7800)
Neutrophils Relative %: 44.7 %
Platelets: 343 10*3/uL (ref 140–400)
RBC: 5.06 10*6/uL (ref 3.80–5.10)
RDW: 13.1 % (ref 11.0–15.0)
Total Lymphocyte: 43.2 %
WBC: 3.7 10*3/uL — ABNORMAL LOW (ref 3.8–10.8)

## 2018-07-13 LAB — MAGNESIUM: Magnesium: 1.9 mg/dL (ref 1.5–2.5)

## 2018-07-13 LAB — COMPLETE METABOLIC PANEL WITH GFR
AG Ratio: 1.8 (calc) (ref 1.0–2.5)
ALT: 39 U/L — ABNORMAL HIGH (ref 6–29)
AST: 35 U/L (ref 10–35)
Albumin: 4.5 g/dL (ref 3.6–5.1)
Alkaline phosphatase (APISO): 73 U/L (ref 31–125)
BUN: 11 mg/dL (ref 7–25)
CO2: 29 mmol/L (ref 20–32)
Calcium: 9.9 mg/dL (ref 8.6–10.2)
Chloride: 103 mmol/L (ref 98–110)
Creat: 0.77 mg/dL (ref 0.50–1.10)
GFR, Est African American: 105 mL/min/{1.73_m2} (ref >=60)
GFR, Est Non African American: 91 mL/min/{1.73_m2} (ref >=60)
Globulin: 2.5 g/dL (calc) (ref 1.9–3.7)
Glucose, Bld: 79 mg/dL (ref 65–99)
Potassium: 4.1 mmol/L (ref 3.5–5.3)
Sodium: 138 mmol/L (ref 135–146)
Total Bilirubin: 0.5 mg/dL (ref 0.2–1.2)
Total Protein: 7 g/dL (ref 6.1–8.1)

## 2018-07-13 LAB — HEMOGLOBIN A1C
Hgb A1c MFr Bld: 5.6 % of total Hgb (ref <5.7)
Mean Plasma Glucose: 114 (calc)
eAG (mmol/L): 6.3 (calc)

## 2018-07-13 LAB — VITAMIN D 25 HYDROXY (VIT D DEFICIENCY, FRACTURES): Vit D, 25-Hydroxy: 61 ng/mL (ref 30–100)

## 2018-07-13 LAB — LIPID PANEL
Cholesterol: 180 mg/dL (ref <200)
HDL: 63 mg/dL (ref >=50)
LDL Cholesterol (Calc): 99 mg/dL (calc)
Non-HDL Cholesterol (Calc): 117 mg/dL (calc) (ref <130)
Total CHOL/HDL Ratio: 2.9 (calc) (ref <5.0)
Triglycerides: 86 mg/dL (ref <150)

## 2018-07-13 LAB — IRON, TOTAL/TOTAL IRON BINDING CAP
%SAT: 35 % (calc) (ref 16–45)
Iron: 132 ug/dL (ref 40–190)
TIBC: 375 mcg/dL (calc) (ref 250–450)

## 2018-07-13 LAB — FERRITIN: Ferritin: 35 ng/mL (ref 16–232)

## 2018-07-13 LAB — VITAMIN B12: Vitamin B-12: 462 pg/mL (ref 200–1100)

## 2018-07-13 LAB — TSH: TSH: 1.77 mIU/L

## 2018-07-18 DIAGNOSIS — J3089 Other allergic rhinitis: Secondary | ICD-10-CM | POA: Diagnosis not present

## 2018-07-18 DIAGNOSIS — J3081 Allergic rhinitis due to animal (cat) (dog) hair and dander: Secondary | ICD-10-CM | POA: Diagnosis not present

## 2018-07-18 DIAGNOSIS — J301 Allergic rhinitis due to pollen: Secondary | ICD-10-CM | POA: Diagnosis not present

## 2018-07-21 DIAGNOSIS — R51 Headache: Secondary | ICD-10-CM | POA: Diagnosis not present

## 2018-07-27 DIAGNOSIS — D497 Neoplasm of unspecified behavior of endocrine glands and other parts of nervous system: Secondary | ICD-10-CM | POA: Diagnosis not present

## 2018-07-31 ENCOUNTER — Encounter: Payer: Self-pay | Admitting: Internal Medicine

## 2018-08-01 DIAGNOSIS — J3089 Other allergic rhinitis: Secondary | ICD-10-CM | POA: Diagnosis not present

## 2018-08-01 DIAGNOSIS — J3081 Allergic rhinitis due to animal (cat) (dog) hair and dander: Secondary | ICD-10-CM | POA: Diagnosis not present

## 2018-08-01 DIAGNOSIS — J301 Allergic rhinitis due to pollen: Secondary | ICD-10-CM | POA: Diagnosis not present

## 2018-08-07 ENCOUNTER — Encounter: Payer: Self-pay | Admitting: Physician Assistant

## 2018-08-07 DIAGNOSIS — M26609 Unspecified temporomandibular joint disorder, unspecified side: Secondary | ICD-10-CM

## 2018-08-07 DIAGNOSIS — J3089 Other allergic rhinitis: Secondary | ICD-10-CM | POA: Diagnosis not present

## 2018-08-07 DIAGNOSIS — J3081 Allergic rhinitis due to animal (cat) (dog) hair and dander: Secondary | ICD-10-CM | POA: Diagnosis not present

## 2018-08-07 DIAGNOSIS — J301 Allergic rhinitis due to pollen: Secondary | ICD-10-CM | POA: Diagnosis not present

## 2018-08-07 HISTORY — DX: Unspecified temporomandibular joint disorder, unspecified side: M26.609

## 2018-08-08 DIAGNOSIS — E348 Other specified endocrine disorders: Secondary | ICD-10-CM | POA: Diagnosis not present

## 2018-08-10 DIAGNOSIS — M542 Cervicalgia: Secondary | ICD-10-CM | POA: Diagnosis not present

## 2018-08-10 DIAGNOSIS — G629 Polyneuropathy, unspecified: Secondary | ICD-10-CM | POA: Diagnosis not present

## 2018-08-10 DIAGNOSIS — Z79899 Other long term (current) drug therapy: Secondary | ICD-10-CM | POA: Diagnosis not present

## 2018-08-10 DIAGNOSIS — G894 Chronic pain syndrome: Secondary | ICD-10-CM | POA: Diagnosis not present

## 2018-08-10 DIAGNOSIS — Z79891 Long term (current) use of opiate analgesic: Secondary | ICD-10-CM | POA: Diagnosis not present

## 2018-08-14 DIAGNOSIS — J3089 Other allergic rhinitis: Secondary | ICD-10-CM | POA: Diagnosis not present

## 2018-08-14 DIAGNOSIS — J3081 Allergic rhinitis due to animal (cat) (dog) hair and dander: Secondary | ICD-10-CM | POA: Diagnosis not present

## 2018-08-14 DIAGNOSIS — J301 Allergic rhinitis due to pollen: Secondary | ICD-10-CM | POA: Diagnosis not present

## 2018-08-15 ENCOUNTER — Other Ambulatory Visit: Payer: Self-pay

## 2018-08-15 ENCOUNTER — Ambulatory Visit (INDEPENDENT_AMBULATORY_CARE_PROVIDER_SITE_OTHER): Payer: Medicare Other | Admitting: Neurology

## 2018-08-15 ENCOUNTER — Encounter: Payer: Self-pay | Admitting: Neurology

## 2018-08-15 VITALS — BP 118/76 | HR 55 | Temp 97.0°F | Ht 66.0 in | Wt 154.0 lb

## 2018-08-15 DIAGNOSIS — M26609 Unspecified temporomandibular joint disorder, unspecified side: Secondary | ICD-10-CM

## 2018-08-15 DIAGNOSIS — Z79891 Long term (current) use of opiate analgesic: Secondary | ICD-10-CM

## 2018-08-15 DIAGNOSIS — G478 Other sleep disorders: Secondary | ICD-10-CM | POA: Diagnosis not present

## 2018-08-15 DIAGNOSIS — G2581 Restless legs syndrome: Secondary | ICD-10-CM | POA: Insufficient documentation

## 2018-08-15 HISTORY — DX: Long term (current) use of opiate analgesic: Z79.891

## 2018-08-15 HISTORY — DX: Restless legs syndrome: G25.81

## 2018-08-15 NOTE — Patient Instructions (Addendum)
Bruxism is a form of PLM.  You have back, shoulder, hip and foot pain. It is possibly related to pain.   Charcot-Marie-Tooth Disease, Adult Charcot-Marie-Tooth disease (CMT) is a group of genetic nervous system diseases that cause gradual loss of strength and feeling in the arms and legs. The condition usually develops in childhood or early adolescence, but it may not be diagnosed until adulthood. CMT affects the nerves outside the brain and spinal cord (peripheral nerves). There are several types of CMT, depending on the type of gene defect (gene mutation) that you have. Over time, the arm and leg muscles may shrink (atrophy). Symptoms of CMT can range from mild to severe. What are the causes? This condition is a genetic condition that is usually passed down through families (inherited). CMT is caused by mutations in the genes that affect the insulation around peripheral nerves (myelin). Over time, these mutations cause the myelin to break down, leading to numbness and weakness in the feet and hands. Rarely, a gene mutation can occur without a family history (spontaneous mutation). What increases the risk? You may be at higher risk of CMT if one or both of your parents carry the gene for CMT. Some forms of CMT can develop from inheriting the gene from one parent, and other forms of CMT develop from inheriting the gene from both parents. What are the signs or symptoms? Symptoms of CMT may vary depending on the form of CMT you have. Symptoms start gradually and get worse over the course of years. Symptoms include:  Muscle weakness in the foot or lower leg.  Trouble walking.  An unusual pace and stride (gait).  Frequent falls.  Deformity of the lower legs and feet, such as high arches.  Mild to severe pain. This is not common. As the disease progresses, it often affects other parts of the body. Later symptoms include:  Weakness in the hands.  Loss of fine motor skills (dexterity).  Loss  of feelings in the hands. Rare types of CMT can lead to:  Hearing loss.  Vision loss.  Shortness of breath.  Vocal cord weakness. How is this diagnosed? Your health care provider may suspect CMT based on:  Your symptoms and a physical exam.  Your medical and family history.  An exam done by a health care provider who specializes in diseases of the nervous system (neurologist). You may also need to have tests, including:  Electromyography (EMG).  Nerve conduction studies.  Genetic testing.  A procedure to collect a sample of nerve tissue (biopsy) to examine under a microscope. How is this treated? There is no cure for CMT. Treatment depends on the severity of your symptoms. Your team of health care providers will develop a treatment plan that is best for you. Treatment may include:  Physical therapy to: ? Strengthen muscles. ? Reduce pain through stretching. ? Increase stamina. ? Prevent falls.  Occupational therapy to help you do your normal activities.  Assistive devices, such as shoe inserts (orthotics), ankle braces, and thumb splints.  Surgery to repair deformities in your feet or joints.  Medicine to relieve pain. Follow these instructions at home:   Learn as much as you can about your condition and work closely with your team of health care providers.  Take over-the-counter and prescription medicines only as told by your health care provider.  Keep all follow-up visits as told by your health care provider. This is important.  Do any physical or occupational therapy exercises at home as  instructed.  Wear your assistive devices as needed to help with mobility and to prevent accidents and injuries.  Consider joining a support group for CMT disease. Contact a health care provider if:  You develop new or worsening symptoms.  You develop new wounds on your feet or legs.  You need more support at home.  You are falling.  You have hearing loss. Get  help right away if:  You have severe pain.  You have shortness of breath. This information is not intended to replace advice given to you by your health care provider. Make sure you discuss any questions you have with your health care provider. Document Released: 03/05/2002 Document Revised: 10/03/2015 Document Reviewed: 07/25/2015 Elsevier Interactive Patient Education  2019 [employer].

## 2018-08-15 NOTE — Progress Notes (Signed)
SLEEP MEDICINE CLINIC   Provider:  Larey Seat, M D  Primary Care Physician:  Unk Pinto, MD   Referring Provider: Unk Pinto, MD    Chief Complaint  Patient presents with  . New Patient (Initial Visit)    pt alone, rm 10. pt states that she has been tired and exhausted unable to get enough sleep at night. unable to sleep good. doesnt snore in sleep. never had a sleep study. getting apx  5-6 hours of sleep a night.     HPI:  Brandi Erickson is a 48 y.o. 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 Melford Aase for non restorative sleep,   Chief complaint according to patient :  I am fatigued and gained weight, and not sure why.    [name] is a 48 year old right-handed African-American female patient of Dr. Melida Gimenez and seen today in a new consultation for sleep medicine.  She has followed Dr. Elvia Collum as a consulting physician and neurology for Charcot-Marie-Tooth disease for about 15 years, her orthopedic surgeon is Dr. Wylene Simmer, had carpal tunnel and  tonsillectomy as a child. history of allergic asthma and several surgeries.    Sleep medical history: CMT,   Family sleep history: not being close to any family members except her sons.    Social history:  Divorced, children are 28 and 62 years old. Non smoker, non ETOH user, caffeine - none.    Sleep habits are as follows: dinner is before 4 o'clock. Healthy, moderate diet. Bedtime is latest 10 PM, but sometimes she isn't asleep before midnight.  She may listen to music, and uses a screen lit-device in bed, has often restless legs. Cramping.  She sleeps on her back, prone or in a fetal position. She uses 2 pillows. She on;y sleeps about 2-3 hours and wakes up from noises, her bed partner snores.  She has 1-2 nocturias, some nights none. No snoring reported, no apnea reported.    Rises at 7.30 AM. Average by her account is 7-8 hours. Naps  seldomly.   Review of Systems: Out of a complete 14 system review, the patient complains of only the following symptoms, and all other reviewed systems are negative.   Epworth Sleepiness score: 6  , Fatigue severity score : 50/ 63 (!)   , depression score N/A   Social History   Socioeconomic History  . Marital status: Married    Spouse name: Not on file  . Number of children: Not on file  . Years of education: Not on file  . Highest education level: Not on file  Occupational History  . Not on file  Social Needs  . Financial resource strain: Not on file  . Food insecurity:    Worry: Not on file    Inability: Not on file  . Transportation needs:    Medical: Not on file    Non-medical: Not on file  Tobacco Use  . Smoking status: Never Smoker  . Smokeless tobacco: Never Used  Substance and Sexual Activity  . Alcohol use: No  . Drug use: No  . Sexual activity: Yes  Lifestyle  . Physical activity:    Days per week: Not on file    Minutes per session: Not on file  . Stress: Not on file  Relationships  . Social connections:    Talks on phone: Not on file    Gets together: Not on file  Attends religious service: Not on file    Active member of club or organization: Not on file    Attends meetings of clubs or organizations: Not on file    Relationship status: Not on file  . Intimate partner violence:    Fear of current or ex partner: Not on file    Emotionally abused: Not on file    Physically abused: Not on file    Forced sexual activity: Not on file  Other Topics Concern  . Not on file  Social History Narrative   ** Merged History Encounter **        Family History  Problem Relation Age of Onset  . Breast cancer Sister   . Brain cancer Sister     Past Medical History:  Diagnosis Date  . Allergy   . Asthma   . Chronic constipation   . CMT (Charcot-Marie-Tooth disease)   . GERD (gastroesophageal reflux disease)   . 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 Seasonal allergies   . Uterine polyp   . Wears glasses     Past Surgical History:  Procedure Laterality Date  . COLONOSCOPY N/A 11/01/2012   Procedure: COLONOSCOPY;  Surgeon: Rogene Houston, MD;  Location: [facility];  Service: Endoscopy;  Laterality: N/A;  130  . DILATION AND CURETTAGE OF UTERUS    . ESOPHAGEAL MANOMETRY  05/17/2011   Procedure: ESOPHAGEAL MANOMETRY (EM);  Surgeon: Beryle Beams, MD;  Location: [facility];  Service: Endoscopy;  Laterality: N/A;  . FOOT NEUROMA SURGERY Right 06-05-2013  . HYSTEROSCOPY W/D&C N/A 06/27/2013   Procedure: HYSTEROSCOPY POLPYECTOMY  ;  Surgeon: Governor Specking, MD;  Location: [facility];  Service: Gynecology;  Laterality: N/A;  . LAPAROSCOPIC TOTAL HYSTERECTOMY  2018   [facility]  . LAPAROSCOPY N/A 06/27/2013   Procedure: LAPAROSCOPY WITH extensive lysis of adhesions;  Surgeon: Governor Specking, MD;  Location: [place];  Service: Gynecology;  Laterality: N/A;  . TONSILLECTOMY    . TONSILLECTOMY  as child  . TUBAL LIGATION  1999  . tubal reconstruction  1997    Current Outpatient Medications  Medication Sig Dispense Refill  . albuterol (PROAIR HFA) 108 (90 Base) MCG/ACT inhaler Inhale 2 puffs into the lungs every 6 (six) hours as needed for wheezing or shortness of breath. 3 Inhaler 4  . aspirin EC 81 MG tablet Take 81 mg by mouth daily.    . cetirizine (ZYRTEC) 10 MG tablet Take 10 mg by mouth every evening.    . fluticasone (CUTIVATE) 0.05 % cream Apply 1 application topically 2 (two) times daily.     . Fluticasone-Salmeterol (ADVAIR DISKUS) 250-50 MCG/DOSE AEPB Inhale 1 puff into the lungs every 12 (twelve) hours. 180 each 3  . gabapentin (NEURONTIN) 800 MG tablet Take 800 mg by mouth 3 (three) times daily.    Marland Kitchen HYDROcodone-acetaminophen (NORCO) 7.5-325 MG tablet Take 1 tablet by mouth. Takes 1 tablet 3 times a day.    . ketoconazole (NIZORAL)  2 % cream Apply 1 application topically 2 (two) times daily. 15 g 0  . methocarbamol (ROBAXIN) 500 MG tablet Take 1 tablet (500 mg total) by mouth 3 (three) times daily as needed for muscle spasms. 270 tablet 1  . montelukast (SINGULAIR) 10 MG tablet Take 1 tablet (10 mg total) by mouth at bedtime. 90 tablet 1  . Olopatadine HCl (PATADAY) 0.2 % SOLN Apply to each eye  as needed 3 Bottle 3  . omeprazole (PRILOSEC) 40 MG capsule Take 1 capsule (40 mg total) by mouth daily. (Patient taking differently: Take 40 mg by mouth daily as needed. ) 90 capsule 3  . OVER THE COUNTER MEDICATION Fiber laxative 2 tsp twice a day.    . senna (SENOKOT) 8.6 MG tablet Take 1 tablet by mouth daily as needed.     . tretinoin (RETIN-A) 0.025 % gel Apply topically at bedtime. 45 g 1  . venlafaxine XR (EFFEXOR XR) 37.5 MG 24 hr capsule Take 1 capsule (37.5 mg total) by mouth daily with breakfast. 90 capsule 1  . Vitamin D, Ergocalciferol, (DRISDOL) 50000 units CAPS capsule Take 1 capsule (50,000 Units total) by mouth every 3 (three) days. 90 capsule 0   No current facility-administered medications for this visit.     Allergies as of 08/15/2018 - Review Complete 08/15/2018  Allergen Reaction Noted  . Linzess [linaclotide] Rash 08/26/2015  . Iodinated diagnostic agents Nausea And Vomiting 11/09/2016  . Oxycontin [oxycodone hcl] Nausea And Vomiting 06/19/2013  . Penicillins Hives 06/19/2013  . Shellfish allergy Other (See Comments) 07/07/2015  . Sulfa antibiotics Hives 06/19/2013  . Topamax [topiramate] Hives 02/04/2015    Vitals: BP 118/76   Pulse (!) 55   Temp (!) 97 F (36.1 C)   Ht 5\' 6"  (1.676 m)   Wt 154 lb (69.9 kg)   LMP 02/13/2016   BMI 24.86 kg/m  Last Weight:  Wt Readings from Last 1 Encounters:  08/15/18 154 lb (69.9 kg)   WUJ:WJXB mass index is 24.86 kg/m.     Last Height:   Ht Readings from Last 1 Encounters:  08/15/18 5\' 6"  (1.676 m)    Physical exam:  General: The patient is awake,  alert and appears not in acute distress. The patient is well groomed. Head: Normocephalic, atraumatic. Neck is supple. Mallampati 4- ,  neck circumference:16". Nasal airflow patent , Retrognathia is mildly seen.  Cardiovascular:  Regular rate and rhythm, without  murmurs or carotid bruit, and without distended neck veins. Respiratory: Lungs are clear to auscultation. Skin:  Without evidence of edema, or rash Trunk: BMI is below 25 . The patient's posture is erect.  Neurologic exam : The patient is awake and alert, oriented to place and time.   Memory subjective described as intact.  Attention span & concentration ability appears normal.  Speech is fluent,  without dysarthria, dysphonia or aphasia.  Mood and affect are appropriate.  Cranial nerves: Pupils are equal and briskly reactive to light. Funduscopic exam without  evidence of pallor or edema. Near sided.  Extraocular movements  in vertical and horizontal planes intact and without nystagmus. Visual fields by finger perimetry are intact. Hearing to finger rub intact.   Facial sensation intact to fine touch.  Facial motor strength is symmetric and tongue and uvula move midline. Shoulder shrug was symmetrical.   Motor exam:   Normal tone, muscle bulk  in all extremities.  Coordination: Rapid alternating movements in the fingers/hands was normal. Gait and station: she has stopped using a cane. Patient walks without assistive device Deep tendon reflexes: all absent.     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   3) yawning here in the visit, a lot- check for OSA, she may have it , has  a narrow upper airway.    The patient was advised of the nature of the diagnosed disorder , the treatment options and the  risks for general health and wellness arising from not treating the condition.   I spent more than 30 minutes of face to face time with the patient.  Greater than 50% of time was spent in counseling and coordination of care. We have discussed the diagnosis and differential and I answered the patient's questions.    Plan:  Treatment plan and additional workup :  She is fine waiting for an in lab sleep study to allow observation of RLS, oximetry, and treatment if needed.   Larey Seat, MD 0/93/2355, 73:22 AM  Certified in Neurology by ABPN Certified in [place] by [facility] [address], [place], [place] [address]

## 2018-08-23 DIAGNOSIS — J301 Allergic rhinitis due to pollen: Secondary | ICD-10-CM | POA: Diagnosis not present

## 2018-08-23 DIAGNOSIS — J3089 Other allergic rhinitis: Secondary | ICD-10-CM | POA: Diagnosis not present

## 2018-08-23 DIAGNOSIS — J3081 Allergic rhinitis due to animal (cat) (dog) hair and dander: Secondary | ICD-10-CM | POA: Diagnosis not present

## 2018-08-31 DIAGNOSIS — J3089 Other allergic rhinitis: Secondary | ICD-10-CM | POA: Diagnosis not present

## 2018-08-31 DIAGNOSIS — J301 Allergic rhinitis due to pollen: Secondary | ICD-10-CM | POA: Diagnosis not present

## 2018-08-31 DIAGNOSIS — J3081 Allergic rhinitis due to animal (cat) (dog) hair and dander: Secondary | ICD-10-CM | POA: Diagnosis not present

## 2018-09-05 DIAGNOSIS — J3089 Other allergic rhinitis: Secondary | ICD-10-CM | POA: Diagnosis not present

## 2018-09-05 DIAGNOSIS — J3081 Allergic rhinitis due to animal (cat) (dog) hair and dander: Secondary | ICD-10-CM | POA: Diagnosis not present

## 2018-09-05 DIAGNOSIS — J301 Allergic rhinitis due to pollen: Secondary | ICD-10-CM | POA: Diagnosis not present

## 2018-09-15 DIAGNOSIS — J3081 Allergic rhinitis due to animal (cat) (dog) hair and dander: Secondary | ICD-10-CM | POA: Diagnosis not present

## 2018-09-15 DIAGNOSIS — J301 Allergic rhinitis due to pollen: Secondary | ICD-10-CM | POA: Diagnosis not present

## 2018-09-15 DIAGNOSIS — J3089 Other allergic rhinitis: Secondary | ICD-10-CM | POA: Diagnosis not present

## 2018-09-19 DIAGNOSIS — J3089 Other allergic rhinitis: Secondary | ICD-10-CM | POA: Diagnosis not present

## 2018-09-19 DIAGNOSIS — J3081 Allergic rhinitis due to animal (cat) (dog) hair and dander: Secondary | ICD-10-CM | POA: Diagnosis not present

## 2018-09-19 DIAGNOSIS — J301 Allergic rhinitis due to pollen: Secondary | ICD-10-CM | POA: Diagnosis not present

## 2018-09-20 ENCOUNTER — Ambulatory Visit (INDEPENDENT_AMBULATORY_CARE_PROVIDER_SITE_OTHER): Payer: Medicare Other | Admitting: Neurology

## 2018-09-20 ENCOUNTER — Other Ambulatory Visit: Payer: Self-pay

## 2018-09-20 DIAGNOSIS — G2581 Restless legs syndrome: Secondary | ICD-10-CM

## 2018-09-20 DIAGNOSIS — G478 Other sleep disorders: Secondary | ICD-10-CM | POA: Diagnosis not present

## 2018-09-20 DIAGNOSIS — Z79891 Long term (current) use of opiate analgesic: Secondary | ICD-10-CM

## 2018-09-20 DIAGNOSIS — M26609 Unspecified temporomandibular joint disorder, unspecified side: Secondary | ICD-10-CM

## 2018-09-25 DIAGNOSIS — R29898 Other symptoms and signs involving the musculoskeletal system: Secondary | ICD-10-CM | POA: Diagnosis not present

## 2018-09-27 ENCOUNTER — Telehealth: Payer: Self-pay | Admitting: Neurology

## 2018-09-27 NOTE — Telephone Encounter (Signed)
-----   Message from Larey Seat, MD sent at 09/27/2018  1:18 PM EDT ----- Audio and video analysis did show abnormally high muscle tension in REM sleep, some PLMs during REM and briefly vocalizations. Snoring was not noted. EKG was in keeping with bradycardia in sinus rhythm (SR). Post-study, the patient indicated that sleep was worse than usual.    IMPRESSION:  1. Non-specific bradycardia in sinus rhythm by EKG. 2. Compromised muscle tone drop out during REM sleep, reflected in PLM arousals during REM sleep.  3. No activation of the chin electrode was seen no indication of Bruxism.  4. No snoring and no central apneas were noted.    RECOMMENDATIONS:  1. Many arousals were spontaneous and unrelated to a physiological cause captured by PSG. 2. I suspect pain is the main cause of many of these spontaneous arousals.

## 2018-09-27 NOTE — Telephone Encounter (Signed)
Called the patient and reviewed the sleep study in detail with her. Advised of the findings and informed the patient that frequent arousals seen but were unrelated to an organic sleep disorder or physiological cause. Patient asked a copy be sent to the PCP. She was concerned about the heart rate and oxygen level and I reviewed with her that it is common for those to somewhat drop and oxygen level never sustained low so that part was good, the bradycardia stayed in a sinus brady rhythm and although Dr Brett Fairy didn't mention how low it went she did not state that it was too low or of clinical concern. Pt verbalized understanding. Pt had no questions at this time but was encouraged to call back if questions arise.

## 2018-09-27 NOTE — Procedures (Signed)
PATIENT'S NAME:  Brandi Erickson, Brandi Erickson DOB:      06/12/1970      MR#:    [id number]     DATE OF RECORDING: 09/20/2018 REFERRING M.D.:  Unk Pinto, MD Study Performed:   Expanded EEG Montage - Polysomnogram HISTORY:  48 year old right-handed African-American female patient of Dr. Unk Pinto and seen today in a new consultation for sleep medicine.  She reports being exhausted , unable to get restorative sleep at night .She has followed Dr. Elvia Collum as a consulting physician in neurology for Charcot-Marie-Tooth disease for about 15 years, her orthopedic surgeon is Dr. Wylene Simmer, had carpal tunnel and tonsillectomy as a child, a history of allergic asthma and several surgeries.    She only sleeps about 2-3 hours en bloc and wakes up from noises, and her bed partner snores.  She has 1-2 times nocturia, some nights none. No snoring and no apnea reported.  Rises at 7.30 AM. TST Average by her account is 7-8 hours, when asked by my nurse she endorsed 5-6 hours of sleep.  The patient endorsed the Epworth Sleepiness Scale at 6 points. High fatigue score 45/63 points.   The patient's weight 154 pounds with a height of 66 (inches), resulting in a BMI of 24.8 kg/m2. The patient's neck circumference measured 16 inches.  CURRENT MEDICATIONS: ProAir, Aspirin, Zyrtec, Advair, Neurontin, Nizoral, Norco, Robaxin, Singulair, Prilosec, Senokot, Effexor, Drisdol   PROCEDURE:  This is a multichannel digital polysomnogram utilizing the Somnostar 11.2 system.  Electrodes and sensors were applied and monitored per AASM Specifications.   EEG, EOG, Chin and Limb EMG, were sampled at 200 Hz.  ECG, Snore and Nasal Pressure, Thermal Airflow, Respiratory Effort, CPAP Flow and Pressure, Oximetry was sampled at 50 Hz. Digital video and audio were recorded.      BASELINE STUDY: Lights Out was at 22:42 and Lights On at 05:00.  Total recording time (TRT) was 378 minutes, with a total sleep time (TST) of 307 minutes.   The  patient's sleep latency was 31.5 minutes. Sustained sleep was reached before midnight. The REM sleep latency was prolonged at 140 minutes.  The sleep efficiency was 81.2 %.     SLEEP ARCHITECTURE: WASO (Wake after sleep onset) was 39.5 minutes.  There were 41.5 minutes in Stage N1, 89.5 minutes Stage N2, 124 minutes Stage N3 and 52 minutes in Stage REM.  The percentage of Stage N1 was 13.5%, Stage N2 was 29.2%, Stage N3 was 40.4% and Stage R (REM sleep) was 16.9%.   RESPIRATORY ANALYSIS:  There were a total of 0 respiratory events:  0 obstructive apneas, 0 central apneas and 0 mixed apneas with a total of 0 apneas and an apnea index (AI) of 0 /hour. There were 0 hypopneas with a hypopnea index of 0 /hour.    The total APNEA/HYPOPNEA INDEX (AHI) was 0 /hour -The patient spent 0 minutes of total sleep time in the supine position and 307 minutes in non-supine. The supine AHI was 0.0 versus a non-supine AHI of 0.0.  OXYGEN SATURATION & C02:  The Wake baseline 02 saturation was 96%, with the lowest being 76%- this was an artefact. The true hypoxia nadir was 85% and there was no prolonged hypoxia. Time spent below 89% saturation equaled less than 2 minutes.  The arousals were noted as: 53 were spontaneous, 6 were associated with PLMs, and none were associated with respiratory events. The patient had a total of 18 Periodic Limb Movements.  The Periodic Limb Movement (PLM)  index was 3.5 and the PLM Arousal index was 1.2/hour.  Audio and video analysis did show abnormally high muscle tension in REM sleep, some PLMs during REM and briefly vocalizations. Snoring was not noted. EKG was in keeping with bradycardia in sinus rhythm (SR). Post-study, the patient indicated that sleep was worse than usual.    IMPRESSION:  1. Non-specific bradycardia in sinus rhythm by EKG. 2. Compromised muscle tone drop out during REM sleep, reflected in PLM arousals during REM sleep.  3. No activation of the chin electrode was  seen no indication of Bruxism.  4. No snoring and no central apneas were noted.    RECOMMENDATIONS:  1. Many arousals were spontaneous and unrelated to a physiological cause captured by PSG. 2. I suspect pain is the main cause of many of these spontaneous arousals.     I certify that I have reviewed the entire raw data recording prior to the issuance of this report in accordance with the Standards of Accreditation of the American Academy of Sleep Medicine (AASM)     Larey Seat, MD   09-26-2018  Diplomat, American Board of Neurology (ABPN) Diplomat, American Board of Sleep Medicine and fellow of the [employer] of Sleep Medicine (AASM ) Medical Director of [employer] [facility] at [employer]

## 2018-09-28 DIAGNOSIS — J3081 Allergic rhinitis due to animal (cat) (dog) hair and dander: Secondary | ICD-10-CM | POA: Diagnosis not present

## 2018-09-28 DIAGNOSIS — J301 Allergic rhinitis due to pollen: Secondary | ICD-10-CM | POA: Diagnosis not present

## 2018-09-28 DIAGNOSIS — J3089 Other allergic rhinitis: Secondary | ICD-10-CM | POA: Diagnosis not present

## 2018-10-03 DIAGNOSIS — J3089 Other allergic rhinitis: Secondary | ICD-10-CM | POA: Diagnosis not present

## 2018-10-03 DIAGNOSIS — J3081 Allergic rhinitis due to animal (cat) (dog) hair and dander: Secondary | ICD-10-CM | POA: Diagnosis not present

## 2018-10-03 DIAGNOSIS — J301 Allergic rhinitis due to pollen: Secondary | ICD-10-CM | POA: Diagnosis not present

## 2018-10-05 ENCOUNTER — Encounter: Payer: Self-pay | Admitting: Internal Medicine

## 2018-10-10 DIAGNOSIS — J3089 Other allergic rhinitis: Secondary | ICD-10-CM | POA: Diagnosis not present

## 2018-10-10 DIAGNOSIS — J301 Allergic rhinitis due to pollen: Secondary | ICD-10-CM | POA: Diagnosis not present

## 2018-10-10 DIAGNOSIS — J3081 Allergic rhinitis due to animal (cat) (dog) hair and dander: Secondary | ICD-10-CM | POA: Diagnosis not present

## 2018-10-13 DIAGNOSIS — G894 Chronic pain syndrome: Secondary | ICD-10-CM | POA: Diagnosis not present

## 2018-10-13 DIAGNOSIS — G629 Polyneuropathy, unspecified: Secondary | ICD-10-CM | POA: Diagnosis not present

## 2018-10-13 DIAGNOSIS — Z79891 Long term (current) use of opiate analgesic: Secondary | ICD-10-CM | POA: Diagnosis not present

## 2018-10-13 DIAGNOSIS — Z79899 Other long term (current) drug therapy: Secondary | ICD-10-CM | POA: Diagnosis not present

## 2018-10-13 DIAGNOSIS — M542 Cervicalgia: Secondary | ICD-10-CM | POA: Diagnosis not present

## 2018-10-25 ENCOUNTER — Telehealth (INDEPENDENT_AMBULATORY_CARE_PROVIDER_SITE_OTHER): Payer: Self-pay | Admitting: *Deleted

## 2018-10-25 NOTE — Telephone Encounter (Signed)
Patient called and states that she has used all the OTC patches for RLQ pain. She is requesting a Rx for the Lidocaine 5% Patch.  Reviewed with Dr.Rehman- He states that Lidocaine 5% patch- patient is to apply one patch to affected area every 12 hours. #30 2 refills. This was called to [facility] in [place] , [place].  Patient was made aware.

## 2018-10-26 DIAGNOSIS — J301 Allergic rhinitis due to pollen: Secondary | ICD-10-CM | POA: Diagnosis not present

## 2018-10-26 DIAGNOSIS — J3081 Allergic rhinitis due to animal (cat) (dog) hair and dander: Secondary | ICD-10-CM | POA: Diagnosis not present

## 2018-10-26 DIAGNOSIS — J3089 Other allergic rhinitis: Secondary | ICD-10-CM | POA: Diagnosis not present

## 2018-11-01 ENCOUNTER — Encounter: Payer: Self-pay | Admitting: Internal Medicine

## 2018-11-01 NOTE — Progress Notes (Signed)
Annual Screening/Preventative Visit & Comprehensive Evaluation &  Examination     This very nice 48 y.o. DBF presents for a Screening /Preventative Visit & comprehensive evaluation and management of multiple medical co-morbidities.  Patient has been followed for HTN, HLD, Prediabetes  and Vitamin D Deficiency. Patient had a sleep study in June by Dr Brett Fairy and had No Sleep Apnea. Patient has GERD controlled on her meds.      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        Patient has been followed expectantly for labile HTN and patient's BP has been controlled at home and patient denies any cardiac symptoms as chest pain, palpitations, shortness of breath, dizziness or ankle swelling. Today's BP is at goal - 112/78.      Patient's hyperlipidemia is controlled with diet and medications. Patient denies myalgias or other medication SE's. Last lipids were  Lab Results  Component Value Date   CHOL 180 07/12/2018   HDL 63 07/12/2018   LDLCALC 99 07/12/2018   TRIG 86 07/12/2018   CHOLHDL 2.9 07/12/2018      Patient has hx/o prediabetes  (A1c 5.7% / 2016)  and patient denies reactive hypoglycemic symptoms, visual blurring, diabetic polys or paresthesias. Last A1c was Normal & at goal: Lab Results  Component Value Date   HGBA1C 5.6 07/12/2018      Finally, patient has history of Vitamin D Deficiency ("25" / 2016)  and last Vitamin D was at goal: Lab Results  Component Value Date   VD25OH 61 07/12/2018   Current Outpatient Medications on File Prior to Visit  Medication Sig  . albuterol (PROAIR HFA) 108 (90 Base) MCG/ACT inhaler Inhale 2 puffs into the lungs every 6 (six) hours as needed for wheezing or shortness of breath.  Marland Kitchen aspirin EC 81 MG tablet Take 81 mg by mouth daily.  . cetirizine (ZYRTEC) 10 MG tablet Take 10 mg by mouth every evening.  .  fluticasone (CUTIVATE) 0.05 % cream Apply 1 application topically 2 (two) times daily.   . Fluticasone-Salmeterol (ADVAIR DISKUS) 250-50 MCG/DOSE AEPB Inhale 1 puff into the lungs every 12 (twelve) hours.  . gabapentin (NEURONTIN) 800 MG tablet Take 800 mg by mouth 3 (three) times daily.  Marland Kitchen HYDROcodone-acetaminophen (NORCO) 7.5-325 MG tablet Take 1 tablet by mouth. Takes 1 tablet 3 times a day.  . ketoconazole (NIZORAL) 2 % cream Apply 1 application topically 2 (two) times daily.  . methocarbamol (ROBAXIN) 500 MG tablet Take 1 tablet (500 mg total) by mouth 3 (three) times daily as needed for muscle spasms.  . montelukast (SINGULAIR) 10 MG tablet Take 1 tablet (10 mg total) by mouth at bedtime.  . Olopatadine HCl (PATADAY) 0.2 % SOLN Apply to each eye as needed  . omeprazole (PRILOSEC) 40 MG capsule Take 1 capsule (40 mg total) by mouth daily. (Patient taking differently: Take 40 mg by mouth daily as needed. )  . OVER THE COUNTER MEDICATION Fiber laxative 2 tsp twice a day.  . senna (SENOKOT) 8.6 MG tablet Take 1 tablet by mouth daily as needed.   . tretinoin (RETIN-A) 0.025 % gel Apply topically at bedtime.  Marland Kitchen venlafaxine XR (EFFEXOR XR) 37.5 MG 24 hr capsule Take 1 capsule (37.5 mg total) by mouth daily with breakfast.  . Vitamin D, Ergocalciferol, (DRISDOL) 50000 units CAPS capsule Take 1 capsule (50,000  Units total) by mouth every 3 (three) days.   No current facility-administered medications on file prior to visit.    Allergies  Allergen Reactions  . Linzess [Linaclotide] Rash  . Iodinated Diagnostic Agents Nausea And Vomiting    MRI dye  nausea  . Oxycontin [Oxycodone Hcl] Nausea And Vomiting  . Penicillins Hives  . Shellfish Allergy Other (See Comments)  . Sulfa Antibiotics Hives  . Topamax [Topiramate] Hives   Past Medical History:  Diagnosis Date  . Allergy   . Asthma   . Chronic constipation   . CMT (Charcot-Marie-Tooth disease)   . GERD (gastroesophageal reflux disease)    . 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 Seasonal allergies   . Uterine polyp   . Wears glasses    Health Maintenance  Topic Date Due  . HIV Screening  06/18/1984  . PAP SMEAR-Modifier  09/13/2016  . MAMMOGRAM  10/13/2018  . INFLUENZA VACCINE  10/28/2018  . TETANUS/TDAP  09/17/2026   Immunization History  Administered Date(s) Administered  . PPD Test 08/26/2015, 09/16/2016  . Tdap 09/16/2016   Last Colon - 11/01/2012 - Dr Laural Golden - [facility]   Last MGM - 10/12/2017  Past Surgical History:  Procedure Laterality Date  . COLONOSCOPY N/A 11/01/2012   Procedure: COLONOSCOPY;  Surgeon: Rogene Houston, MD;  Location: [facility];  Service: Endoscopy;  Laterality: N/A;  130  . DILATION AND CURETTAGE OF UTERUS    . ESOPHAGEAL MANOMETRY  05/17/2011   Procedure: ESOPHAGEAL MANOMETRY (EM);  Surgeon: Beryle Beams, MD;  Location: [facility];  Service: Endoscopy;  Laterality: N/A;  . FOOT NEUROMA SURGERY Right 06-05-2013  . HYSTEROSCOPY W/D&C N/A 06/27/2013   Procedure: HYSTEROSCOPY POLPYECTOMY  ;  Surgeon: Governor Specking, MD;  Location: [facility];  Service: Gynecology;  Laterality: N/A;  . LAPAROSCOPIC TOTAL HYSTERECTOMY  2018   [facility]  . LAPAROSCOPY N/A 06/27/2013   Procedure: LAPAROSCOPY WITH extensive lysis of adhesions;  Surgeon: Governor Specking, MD;  Location: [place];  Service: Gynecology;  Laterality: N/A;  . TONSILLECTOMY    . TONSILLECTOMY  as child  . TUBAL LIGATION  1999  . tubal reconstruction  1997   Family History  Problem Relation Age of Onset  . Breast cancer Sister   . Brain cancer Sister    Social History   Tobacco Use  . Smoking status: Never Smoker  . Smokeless tobacco: Never Used  Substance Use Topics  . Alcohol use: No  . Drug use: No    ROS Constitutional: Denies fever, chills, weight loss/gain, headaches, insomnia,  night sweats, and change in  appetite. Does c/o fatigue. Eyes: Denies redness, blurred vision, diplopia, discharge, itchy, watery eyes.  ENT: Denies discharge, congestion, post nasal drip, epistaxis, sore throat, earache, hearing loss, dental pain, Tinnitus, Vertigo, Sinus pain, snoring.  Cardio: Denies chest pain, palpitations, irregular heartbeat, syncope, dyspnea, diaphoresis, orthopnea, PND, claudication, edema Respiratory: denies cough, dyspnea, DOE, pleurisy, hoarseness, laryngitis, wheezing.  Gastrointestinal: Denies dysphagia, heartburn, reflux, water brash, pain, cramps, nausea, vomiting, bloating, diarrhea, constipation, hematemesis, melena, hematochezia, jaundice, hemorrhoids Genitourinary: Denies dysuria, frequency, urgency, nocturia, hesitancy, discharge, hematuria, flank pain Breast: Breast lumps, nipple discharge, bleeding.  Musculoskeletal: Denies arthralgia, myalgia, stiffness, Jt. Swelling, pain, limp, and strain/sprain. Denies falls. Skin: Denies puritis, rash, hives, warts, acne, eczema, changing in skin lesion Neuro: No weakness, tremor, incoordination, spasms, paresthesia, pain Psychiatric: Denies confusion, memory loss, sensory  loss. Denies Depression. Endocrine: Denies change in weight, skin, hair change, nocturia, and paresthesia, diabetic polys, visual blurring, hyper / hypo glycemic episodes.  Heme/Lymph: No excessive bleeding, bruising, enlarged lymph nodes.  Physical Exam  BP 112/78   Pulse 64   Temp (!) 97.4 F (36.3 C)   Resp 16   Ht 5' 4.5" (1.638 m)   Wt 147 lb 3.2 oz (66.8 kg)   LMP 02/13/2016   BMI 24.88 kg/m   General Appearance: Well nourished, well groomed and in no apparent distress.  Eyes: PERRLA, EOMs, conjunctiva no swelling or erythema, normal fundi and vessels. Sinuses: No frontal/maxillary tenderness ENT/Mouth: EACs patent / TMs  nl. Nares clear without erythema, swelling, mucoid exudates. Oral hygiene is good. No erythema, swelling, or exudate. Tongue normal,  non-obstructing. Tonsils not swollen or erythematous. Hearing normal.  Neck: Supple, thyroid not palpable. No bruits, nodes or JVD. Respiratory: Respiratory effort normal.  BS equal and clear bilateral without rales, rhonci, wheezing or stridor. Cardio: Heart sounds are normal with regular rate and rhythm and no murmurs, rubs or gallops. Peripheral pulses are normal and equal bilaterally without edema. No aortic or femoral bruits. Chest: symmetric with normal excursions and percussion. Breasts: Symmetric, without lumps, nipple discharge, retractions, or fibrocystic changes.  Abdomen: Flat, soft with bowel sounds active. Nontender, no guarding, rebound, hernias, masses, or organomegaly.  Lymphatics: Non tender without lymphadenopathy.  Musculoskeletal: Full ROM all peripheral extremities, joint stability, 5/5 strength, and normal gait. Skin: Warm and dry without rashes, lesions, cyanosis, clubbing or  ecchymosis.  Neuro: Cranial nerves intact, reflexes equal bilaterally. Normal muscle tone, no cerebellar symptoms. Sensation intact.  Pysch: Alert and oriented X 3, normal affect, Insight and Judgment appropriate.   Assessment and Plan  1. Annual Preventative Screening Examination  2. Labile hypertension  - EKG 12-Lead - Urinalysis, Routine w reflex microscopic - Microalbumin / creatinine urine ratio - POC Hemoccult Bld/Stl - CBC with Differential/Platelet - COMPLETE METABOLIC PANEL WITH GFR - Magnesium - TSH  3. Hyperlipidemia, mixed  - EKG 12-Lead - Lipid panel - TSH  4. Abnormal glucose  - EKG 12-Lead - Hemoglobin A1c - Insulin, random  5. Vitamin D deficiency  - VITAMIN D 25 Hydroxl  6. Prediabetes  - EKG 12-Lead - Hemoglobin A1c - Insulin, random  7. CMT (Charcot-Marie-Tooth disease)  8. Chronic pain syndrome  9. Gastroesophageal reflux disease  - CBC with Differential/Platelet  10. Screening for colorectal cancer  - POC Hemoccult Bld/Stl   11. Screening  for ischemic heart disease  - EKG 12-Lead - Lipid panel  12. Medication management  - Urinalysis, Routine w reflex microscopic - Microalbumin / creatinine urine ratio - CBC with Differential/Platelet - COMPLETE METABOLIC PANEL WITH GFR - Magnesium - Lipid panel - TSH - Hemoglobin A1c - Insulin, random - VITAMIN D 25 Hydroxyl       Patient was counseled in prudent diet to achieve/maintain BMI less than 25 for weight control, BP monitoring, regular exercise and medications. Discussed med's effects and SE's. Screening labs and tests as requested with regular follow-up as recommended. Over 40 minutes of exam, counseling, chart review and high complex critical decision making was performed.   Kirtland Bouchard, MD

## 2018-11-01 NOTE — Patient Instructions (Signed)

## 2018-11-02 ENCOUNTER — Other Ambulatory Visit: Payer: Self-pay

## 2018-11-02 ENCOUNTER — Ambulatory Visit (INDEPENDENT_AMBULATORY_CARE_PROVIDER_SITE_OTHER): Payer: Medicare Other | Admitting: Internal Medicine

## 2018-11-02 VITALS — BP 112/78 | HR 64 | Temp 97.4°F | Resp 16 | Ht 64.5 in | Wt 147.2 lb

## 2018-11-02 DIAGNOSIS — Z1211 Encounter for screening for malignant neoplasm of colon: Secondary | ICD-10-CM

## 2018-11-02 DIAGNOSIS — R7309 Other abnormal glucose: Secondary | ICD-10-CM | POA: Diagnosis not present

## 2018-11-02 DIAGNOSIS — R7303 Prediabetes: Secondary | ICD-10-CM

## 2018-11-02 DIAGNOSIS — Z1212 Encounter for screening for malignant neoplasm of rectum: Secondary | ICD-10-CM

## 2018-11-02 DIAGNOSIS — E782 Mixed hyperlipidemia: Secondary | ICD-10-CM

## 2018-11-02 DIAGNOSIS — E559 Vitamin D deficiency, unspecified: Secondary | ICD-10-CM | POA: Diagnosis not present

## 2018-11-02 DIAGNOSIS — Z136 Encounter for screening for cardiovascular disorders: Secondary | ICD-10-CM

## 2018-11-02 DIAGNOSIS — R0989 Other specified symptoms and signs involving the circulatory and respiratory systems: Secondary | ICD-10-CM

## 2018-11-02 DIAGNOSIS — K219 Gastro-esophageal reflux disease without esophagitis: Secondary | ICD-10-CM

## 2018-11-02 DIAGNOSIS — I10 Essential (primary) hypertension: Secondary | ICD-10-CM | POA: Diagnosis not present

## 2018-11-02 DIAGNOSIS — Z0000 Encounter for general adult medical examination without abnormal findings: Secondary | ICD-10-CM | POA: Diagnosis not present

## 2018-11-02 DIAGNOSIS — Z0001 Encounter for general adult medical examination with abnormal findings: Secondary | ICD-10-CM

## 2018-11-02 DIAGNOSIS — G600 Hereditary motor and sensory neuropathy: Secondary | ICD-10-CM

## 2018-11-02 DIAGNOSIS — G894 Chronic pain syndrome: Secondary | ICD-10-CM

## 2018-11-02 DIAGNOSIS — Z79899 Other long term (current) drug therapy: Secondary | ICD-10-CM

## 2018-11-02 MED ORDER — GABAPENTIN 800 MG PO TABS: ORAL_TABLET | ORAL | 3 refills | Status: DC

## 2018-11-03 ENCOUNTER — Encounter: Payer: Self-pay | Admitting: Internal Medicine

## 2018-11-03 DIAGNOSIS — J301 Allergic rhinitis due to pollen: Secondary | ICD-10-CM | POA: Diagnosis not present

## 2018-11-03 DIAGNOSIS — J3089 Other allergic rhinitis: Secondary | ICD-10-CM | POA: Diagnosis not present

## 2018-11-03 DIAGNOSIS — J3081 Allergic rhinitis due to animal (cat) (dog) hair and dander: Secondary | ICD-10-CM | POA: Diagnosis not present

## 2018-11-03 LAB — LIPID PANEL
Cholesterol: 172 mg/dL (ref <200)
HDL: 62 mg/dL (ref >=50)
LDL Cholesterol (Calc): 95 mg/dL (calc)
Non-HDL Cholesterol (Calc): 110 mg/dL (calc) (ref <130)
Total CHOL/HDL Ratio: 2.8 (calc) (ref <5.0)
Triglycerides: 64 mg/dL (ref <150)

## 2018-11-03 LAB — COMPLETE METABOLIC PANEL WITH GFR
AG Ratio: 1.7 (calc) (ref 1.0–2.5)
ALT: 21 U/L (ref 6–29)
AST: 24 U/L (ref 10–35)
Albumin: 4.5 g/dL (ref 3.6–5.1)
Alkaline phosphatase (APISO): 77 U/L (ref 31–125)
BUN: 8 mg/dL (ref 7–25)
CO2: 28 mmol/L (ref 20–32)
Calcium: 9.9 mg/dL (ref 8.6–10.2)
Chloride: 100 mmol/L (ref 98–110)
Creat: 0.73 mg/dL (ref 0.50–1.10)
GFR, Est African American: 112 mL/min/{1.73_m2} (ref >=60)
GFR, Est Non African American: 97 mL/min/{1.73_m2} (ref >=60)
Globulin: 2.7 g/dL (calc) (ref 1.9–3.7)
Glucose, Bld: 84 mg/dL (ref 65–99)
Potassium: 3.9 mmol/L (ref 3.5–5.3)
Sodium: 136 mmol/L (ref 135–146)
Total Bilirubin: 0.4 mg/dL (ref 0.2–1.2)
Total Protein: 7.2 g/dL (ref 6.1–8.1)

## 2018-11-03 LAB — HEMOGLOBIN A1C
Hgb A1c MFr Bld: 5.7 % of total Hgb — ABNORMAL HIGH (ref <5.7)
Mean Plasma Glucose: 117 (calc)
eAG (mmol/L): 6.5 (calc)

## 2018-11-03 LAB — CBC WITH DIFFERENTIAL/PLATELET
Absolute Monocytes: 409 cells/uL (ref 200–950)
Basophils Absolute: 18 cells/uL (ref 0–200)
Basophils Relative: 0.4 %
Eosinophils Absolute: 60 cells/uL (ref 15–500)
Eosinophils Relative: 1.3 %
HCT: 39.7 % (ref 35.0–45.0)
Hemoglobin: 12.8 g/dL (ref 11.7–15.5)
Lymphs Abs: 2056 cells/uL (ref 850–3900)
MCH: 25.9 pg — ABNORMAL LOW (ref 27.0–33.0)
MCHC: 32.2 g/dL (ref 32.0–36.0)
MCV: 80.2 fL (ref 80.0–100.0)
MPV: 10.3 fL (ref 7.5–12.5)
Monocytes Relative: 8.9 %
Neutro Abs: 2056 cells/uL (ref 1500–7800)
Neutrophils Relative %: 44.7 %
Platelets: 363 10*3/uL (ref 140–400)
RBC: 4.95 10*6/uL (ref 3.80–5.10)
RDW: 13 % (ref 11.0–15.0)
Total Lymphocyte: 44.7 %
WBC: 4.6 10*3/uL (ref 3.8–10.8)

## 2018-11-03 LAB — URINALYSIS, ROUTINE W REFLEX MICROSCOPIC
Bilirubin Urine: NEGATIVE
Glucose, UA: NEGATIVE
Hgb urine dipstick: NEGATIVE
Ketones, ur: NEGATIVE
Leukocytes,Ua: NEGATIVE
Nitrite: NEGATIVE
Protein, ur: NEGATIVE
Specific Gravity, Urine: 1.017 (ref 1.001–1.03)
pH: <=5 (ref 5.0–8.0)

## 2018-11-03 LAB — TSH: TSH: 1.1 mIU/L

## 2018-11-03 LAB — MAGNESIUM: Magnesium: 1.9 mg/dL (ref 1.5–2.5)

## 2018-11-03 LAB — MICROALBUMIN / CREATININE URINE RATIO
Creatinine, Urine: 143 mg/dL (ref 20–275)
Microalb Creat Ratio: 3 mcg/mg creat (ref <30)
Microalb, Ur: 0.4 mg/dL

## 2018-11-03 LAB — INSULIN, RANDOM: Insulin: 3.8 u[IU]/mL

## 2018-11-03 LAB — VITAMIN D 25 HYDROXY (VIT D DEFICIENCY, FRACTURES): Vit D, 25-Hydroxy: 122 ng/mL — ABNORMAL HIGH (ref 30–100)

## 2018-11-07 DIAGNOSIS — J3089 Other allergic rhinitis: Secondary | ICD-10-CM | POA: Diagnosis not present

## 2018-11-07 DIAGNOSIS — J301 Allergic rhinitis due to pollen: Secondary | ICD-10-CM | POA: Diagnosis not present

## 2018-11-07 DIAGNOSIS — J3081 Allergic rhinitis due to animal (cat) (dog) hair and dander: Secondary | ICD-10-CM | POA: Diagnosis not present

## 2018-11-23 DIAGNOSIS — Z79899 Other long term (current) drug therapy: Secondary | ICD-10-CM | POA: Diagnosis not present

## 2018-11-23 DIAGNOSIS — G894 Chronic pain syndrome: Secondary | ICD-10-CM | POA: Diagnosis not present

## 2018-11-23 DIAGNOSIS — Z79891 Long term (current) use of opiate analgesic: Secondary | ICD-10-CM | POA: Diagnosis not present

## 2018-11-28 DIAGNOSIS — J3081 Allergic rhinitis due to animal (cat) (dog) hair and dander: Secondary | ICD-10-CM | POA: Diagnosis not present

## 2018-11-28 DIAGNOSIS — J3089 Other allergic rhinitis: Secondary | ICD-10-CM | POA: Diagnosis not present

## 2018-11-28 DIAGNOSIS — J301 Allergic rhinitis due to pollen: Secondary | ICD-10-CM | POA: Diagnosis not present

## 2018-11-29 ENCOUNTER — Other Ambulatory Visit: Payer: Self-pay

## 2018-11-29 DIAGNOSIS — Z20822 Contact with and (suspected) exposure to covid-19: Secondary | ICD-10-CM

## 2018-11-29 DIAGNOSIS — R6889 Other general symptoms and signs: Secondary | ICD-10-CM | POA: Diagnosis not present

## 2018-11-30 LAB — NOVEL CORONAVIRUS, NAA: SARS-CoV-2, NAA: NOT DETECTED

## 2018-12-01 DIAGNOSIS — J3081 Allergic rhinitis due to animal (cat) (dog) hair and dander: Secondary | ICD-10-CM | POA: Diagnosis not present

## 2018-12-01 DIAGNOSIS — J3089 Other allergic rhinitis: Secondary | ICD-10-CM | POA: Diagnosis not present

## 2018-12-01 DIAGNOSIS — J301 Allergic rhinitis due to pollen: Secondary | ICD-10-CM | POA: Diagnosis not present

## 2018-12-14 DIAGNOSIS — M542 Cervicalgia: Secondary | ICD-10-CM | POA: Diagnosis not present

## 2018-12-14 DIAGNOSIS — G629 Polyneuropathy, unspecified: Secondary | ICD-10-CM | POA: Diagnosis not present

## 2018-12-14 DIAGNOSIS — G894 Chronic pain syndrome: Secondary | ICD-10-CM | POA: Diagnosis not present

## 2018-12-19 DIAGNOSIS — J301 Allergic rhinitis due to pollen: Secondary | ICD-10-CM | POA: Diagnosis not present

## 2018-12-19 DIAGNOSIS — J3081 Allergic rhinitis due to animal (cat) (dog) hair and dander: Secondary | ICD-10-CM | POA: Diagnosis not present

## 2018-12-19 DIAGNOSIS — J3089 Other allergic rhinitis: Secondary | ICD-10-CM | POA: Diagnosis not present

## 2018-12-22 DIAGNOSIS — J3089 Other allergic rhinitis: Secondary | ICD-10-CM | POA: Diagnosis not present

## 2018-12-22 DIAGNOSIS — J3081 Allergic rhinitis due to animal (cat) (dog) hair and dander: Secondary | ICD-10-CM | POA: Diagnosis not present

## 2018-12-22 DIAGNOSIS — J301 Allergic rhinitis due to pollen: Secondary | ICD-10-CM | POA: Diagnosis not present

## 2018-12-27 DIAGNOSIS — J4520 Mild intermittent asthma, uncomplicated: Secondary | ICD-10-CM | POA: Diagnosis not present

## 2018-12-28 DIAGNOSIS — J3089 Other allergic rhinitis: Secondary | ICD-10-CM | POA: Diagnosis not present

## 2018-12-28 DIAGNOSIS — J301 Allergic rhinitis due to pollen: Secondary | ICD-10-CM | POA: Diagnosis not present

## 2018-12-28 DIAGNOSIS — J3081 Allergic rhinitis due to animal (cat) (dog) hair and dander: Secondary | ICD-10-CM | POA: Diagnosis not present

## 2019-01-09 DIAGNOSIS — J3081 Allergic rhinitis due to animal (cat) (dog) hair and dander: Secondary | ICD-10-CM | POA: Diagnosis not present

## 2019-01-09 DIAGNOSIS — J3089 Other allergic rhinitis: Secondary | ICD-10-CM | POA: Diagnosis not present

## 2019-01-09 DIAGNOSIS — J301 Allergic rhinitis due to pollen: Secondary | ICD-10-CM | POA: Diagnosis not present

## 2019-01-12 DIAGNOSIS — J3089 Other allergic rhinitis: Secondary | ICD-10-CM | POA: Diagnosis not present

## 2019-01-12 DIAGNOSIS — J3081 Allergic rhinitis due to animal (cat) (dog) hair and dander: Secondary | ICD-10-CM | POA: Diagnosis not present

## 2019-01-12 DIAGNOSIS — J301 Allergic rhinitis due to pollen: Secondary | ICD-10-CM | POA: Diagnosis not present

## 2019-01-19 DIAGNOSIS — J3089 Other allergic rhinitis: Secondary | ICD-10-CM | POA: Diagnosis not present

## 2019-01-19 DIAGNOSIS — J3081 Allergic rhinitis due to animal (cat) (dog) hair and dander: Secondary | ICD-10-CM | POA: Diagnosis not present

## 2019-01-19 DIAGNOSIS — J301 Allergic rhinitis due to pollen: Secondary | ICD-10-CM | POA: Diagnosis not present

## 2019-02-06 DIAGNOSIS — H16223 Keratoconjunctivitis sicca, not specified as Sjogren's, bilateral: Secondary | ICD-10-CM | POA: Diagnosis not present

## 2019-02-06 DIAGNOSIS — H0102B Squamous blepharitis left eye, upper and lower eyelids: Secondary | ICD-10-CM | POA: Diagnosis not present

## 2019-02-06 DIAGNOSIS — H1045 Other chronic allergic conjunctivitis: Secondary | ICD-10-CM | POA: Diagnosis not present

## 2019-02-06 DIAGNOSIS — H5213 Myopia, bilateral: Secondary | ICD-10-CM | POA: Diagnosis not present

## 2019-02-06 DIAGNOSIS — H0102A Squamous blepharitis right eye, upper and lower eyelids: Secondary | ICD-10-CM | POA: Diagnosis not present

## 2019-02-06 DIAGNOSIS — H31091 Other chorioretinal scars, right eye: Secondary | ICD-10-CM | POA: Diagnosis not present

## 2019-02-08 DIAGNOSIS — J3089 Other allergic rhinitis: Secondary | ICD-10-CM | POA: Diagnosis not present

## 2019-02-08 DIAGNOSIS — J3081 Allergic rhinitis due to animal (cat) (dog) hair and dander: Secondary | ICD-10-CM | POA: Diagnosis not present

## 2019-02-08 DIAGNOSIS — J301 Allergic rhinitis due to pollen: Secondary | ICD-10-CM | POA: Diagnosis not present

## 2019-02-13 DIAGNOSIS — M542 Cervicalgia: Secondary | ICD-10-CM | POA: Diagnosis not present

## 2019-02-13 DIAGNOSIS — Z79899 Other long term (current) drug therapy: Secondary | ICD-10-CM | POA: Diagnosis not present

## 2019-02-13 DIAGNOSIS — G629 Polyneuropathy, unspecified: Secondary | ICD-10-CM | POA: Diagnosis not present

## 2019-02-13 DIAGNOSIS — G894 Chronic pain syndrome: Secondary | ICD-10-CM | POA: Diagnosis not present

## 2019-02-13 DIAGNOSIS — Z79891 Long term (current) use of opiate analgesic: Secondary | ICD-10-CM | POA: Diagnosis not present

## 2019-02-15 DIAGNOSIS — J301 Allergic rhinitis due to pollen: Secondary | ICD-10-CM | POA: Diagnosis not present

## 2019-02-15 DIAGNOSIS — J3081 Allergic rhinitis due to animal (cat) (dog) hair and dander: Secondary | ICD-10-CM | POA: Diagnosis not present

## 2019-02-15 DIAGNOSIS — J3089 Other allergic rhinitis: Secondary | ICD-10-CM | POA: Diagnosis not present

## 2019-02-20 DIAGNOSIS — J3081 Allergic rhinitis due to animal (cat) (dog) hair and dander: Secondary | ICD-10-CM | POA: Diagnosis not present

## 2019-02-20 DIAGNOSIS — J301 Allergic rhinitis due to pollen: Secondary | ICD-10-CM | POA: Diagnosis not present

## 2019-02-20 DIAGNOSIS — J3089 Other allergic rhinitis: Secondary | ICD-10-CM | POA: Diagnosis not present

## 2019-02-26 DIAGNOSIS — M21372 Foot drop, left foot: Secondary | ICD-10-CM | POA: Diagnosis not present

## 2019-02-26 DIAGNOSIS — M21371 Foot drop, right foot: Secondary | ICD-10-CM | POA: Diagnosis not present

## 2019-03-01 DIAGNOSIS — J3081 Allergic rhinitis due to animal (cat) (dog) hair and dander: Secondary | ICD-10-CM | POA: Diagnosis not present

## 2019-03-01 DIAGNOSIS — J3089 Other allergic rhinitis: Secondary | ICD-10-CM | POA: Diagnosis not present

## 2019-03-01 DIAGNOSIS — J301 Allergic rhinitis due to pollen: Secondary | ICD-10-CM | POA: Diagnosis not present

## 2019-03-13 DIAGNOSIS — J3081 Allergic rhinitis due to animal (cat) (dog) hair and dander: Secondary | ICD-10-CM | POA: Diagnosis not present

## 2019-03-13 DIAGNOSIS — J3089 Other allergic rhinitis: Secondary | ICD-10-CM | POA: Diagnosis not present

## 2019-03-13 DIAGNOSIS — J301 Allergic rhinitis due to pollen: Secondary | ICD-10-CM | POA: Diagnosis not present

## 2019-03-20 DIAGNOSIS — J301 Allergic rhinitis due to pollen: Secondary | ICD-10-CM | POA: Diagnosis not present

## 2019-03-20 DIAGNOSIS — J3081 Allergic rhinitis due to animal (cat) (dog) hair and dander: Secondary | ICD-10-CM | POA: Diagnosis not present

## 2019-03-20 DIAGNOSIS — J3089 Other allergic rhinitis: Secondary | ICD-10-CM | POA: Diagnosis not present

## 2019-03-27 DIAGNOSIS — J3089 Other allergic rhinitis: Secondary | ICD-10-CM | POA: Diagnosis not present

## 2019-03-27 DIAGNOSIS — J3081 Allergic rhinitis due to animal (cat) (dog) hair and dander: Secondary | ICD-10-CM | POA: Diagnosis not present

## 2019-03-27 DIAGNOSIS — J301 Allergic rhinitis due to pollen: Secondary | ICD-10-CM | POA: Diagnosis not present

## 2019-04-05 DIAGNOSIS — Z79891 Long term (current) use of opiate analgesic: Secondary | ICD-10-CM | POA: Diagnosis not present

## 2019-04-05 DIAGNOSIS — Z79899 Other long term (current) drug therapy: Secondary | ICD-10-CM | POA: Diagnosis not present

## 2019-04-05 DIAGNOSIS — M542 Cervicalgia: Secondary | ICD-10-CM | POA: Diagnosis not present

## 2019-04-05 DIAGNOSIS — G894 Chronic pain syndrome: Secondary | ICD-10-CM | POA: Diagnosis not present

## 2019-04-05 DIAGNOSIS — G629 Polyneuropathy, unspecified: Secondary | ICD-10-CM | POA: Diagnosis not present

## 2019-04-05 DIAGNOSIS — M549 Dorsalgia, unspecified: Secondary | ICD-10-CM | POA: Diagnosis not present

## 2019-04-12 DIAGNOSIS — J3081 Allergic rhinitis due to animal (cat) (dog) hair and dander: Secondary | ICD-10-CM | POA: Diagnosis not present

## 2019-04-12 DIAGNOSIS — J3089 Other allergic rhinitis: Secondary | ICD-10-CM | POA: Diagnosis not present

## 2019-04-12 DIAGNOSIS — J301 Allergic rhinitis due to pollen: Secondary | ICD-10-CM | POA: Diagnosis not present

## 2019-04-26 DIAGNOSIS — J301 Allergic rhinitis due to pollen: Secondary | ICD-10-CM | POA: Diagnosis not present

## 2019-04-26 DIAGNOSIS — J3081 Allergic rhinitis due to animal (cat) (dog) hair and dander: Secondary | ICD-10-CM | POA: Diagnosis not present

## 2019-04-26 DIAGNOSIS — J3089 Other allergic rhinitis: Secondary | ICD-10-CM | POA: Diagnosis not present

## 2019-04-27 ENCOUNTER — Ambulatory Visit: Payer: Medicare Other | Attending: Internal Medicine

## 2019-04-27 DIAGNOSIS — Z20822 Contact with and (suspected) exposure to covid-19: Secondary | ICD-10-CM

## 2019-04-28 LAB — NOVEL CORONAVIRUS, NAA: SARS-CoV-2, NAA: NOT DETECTED

## 2019-04-30 ENCOUNTER — Telehealth: Payer: Self-pay | Admitting: Physician Assistant

## 2019-04-30 DIAGNOSIS — K219 Gastro-esophageal reflux disease without esophagitis: Secondary | ICD-10-CM

## 2019-04-30 MED ORDER — OMEPRAZOLE 40 MG PO CPDR: 40.0000 mg | DELAYED_RELEASE_CAPSULE | Freq: Every day | ORAL | 3 refills | Status: DC

## 2019-04-30 MED ORDER — TRETINOIN 0.025 % EX GEL: Freq: Every day | CUTANEOUS | 1 refills | Status: DC

## 2019-04-30 NOTE — Telephone Encounter (Signed)
-----   Message from [place] sent at 04/30/2019  3:58 PM EST ----- Regarding: OFFICE NOTE/RX Contact: [phone] [facility] MEDS FOR HEARTBURN  [place]

## 2019-04-30 NOTE — Telephone Encounter (Signed)
Recent Visits Date Type Provider Dept  11/02/18 Office Visit Unk Pinto, MD [place]  07/12/18 Office Visit Vicie Mutters, PA-C [place]  05/24/18 Office Visit Liane Comber, NP [place]  03/30/18 Office Visit Liane Comber, NP [facility]  12/22/17 Office Visit Unk Pinto, MD [place] recent visits within past 540 days with a meds authorizing provider and meeting all other requirements   Future Appointments Date Type Provider Dept  07/18/19 Appointment Vicie Mutters, PA-C [facility]  Showing future appointments within next 150 days with a meds authorizing provider and meeting all other requirements

## 2019-05-03 DIAGNOSIS — J3081 Allergic rhinitis due to animal (cat) (dog) hair and dander: Secondary | ICD-10-CM | POA: Diagnosis not present

## 2019-05-03 DIAGNOSIS — J3089 Other allergic rhinitis: Secondary | ICD-10-CM | POA: Diagnosis not present

## 2019-05-03 DIAGNOSIS — J301 Allergic rhinitis due to pollen: Secondary | ICD-10-CM | POA: Diagnosis not present

## 2019-05-04 NOTE — Progress Notes (Signed)
Assessment and Plan:  Brandi Erickson was seen today for chest pain.  Diagnoses and all orders for this visit:  Chest pain, Costochondral chest pain Hx not suggestive of cardiac etiology, has had recent normal stress ECHO, EKG, ? holter, low risk history Exam with tenderness most consistent with costochondral etiology Possible element of anxiety off of effexor Asthma/GERD appears well controlled, continue follow up with specialists as recommended She is reassured by my exam today; after discussion, will obtain a CXR as none recent though discussed do not expect to see any significant changes with costochrondritis Discussed, rest, icing, NSAIDs Reminder to go to the ER if any persistent/new CP, SOB, nausea, dizziness, severe HA, changes vision/speech, left arm numbness and tingling and jaw pain. Follow up in office with any changes -     DG Chest 2 View; Future  Anxiety Restart anxiety medication; follow up with counseling  Stress management techniques discussed, increase water, good sleep hygiene discussed, increase exercise, and increase veggies.  -     venlafaxine XR (EFFEXOR XR) 37.5 MG 24 hr capsule; Take 1 capsule (37.5 mg total) by mouth daily with breakfast.   Further disposition pending results of labs. Discussed med's effects and SE's.   Over 30 minutes of exam, counseling, chart review, and critical decision making was performed.   Future Appointments  Date Time Provider [place]  06/19/2019 11:00 AM Rogene Houston, MD [facility] None  07/18/2019 10:00 AM Vicie Mutters, PA-C [facility] None  12/12/2019  3:00 PM Unk Pinto, MD [facility] None    ------------------------------------------------------------------------------------------------------------------   HPI BP (!) 106/58   Pulse 60   Temp (!) 96.3 F (35.7 C)   Wt 148 lb 3.2 oz (67.2 kg)   LMP 02/13/2016   SpO2 99%   BMI 25.05 kg/m   49 y.o.female with hx of asthma/allergies (follows pulmonology),  anxiety, GERD/dysphagia, charcot-Marie-Tooth presents for evaluation of intermittent but persistent chest pain, bilateral, ongoing since last year.   She cannot recall onset, no notable event prior to onset; reports can have at rest, doesn't associate with exertion, will not have even with carrying a load upstairs; does endorse fatigue, "just don't have energy like I used to."   She describes episodes as sudden, sharp pain, no associated activities or positions. She reports will hit suddenly, sharp pain in the center of her chest and across bilateral chest, "so sharp I can't breathe," recently had episode where seemed to radiate to back last week. Denies neck/arm/shoulder pain. She reports episodes last a few seconds to a few hours, will lie down and wake up without pain. She endorses chest tightness, difficulty taking deep breath, dizziness and lightheaded at the same, usually will make it to a chair but then "will black out" though denies falls related to this. Reports episodes have never been witnessed. Denies associated with exertion or food intake. She estimates 2-3 episodes per month. Denies any aggravating or alleviating factors.   She had episode of syncope and was evaluated by cardiology in 05/2017, had normal stress echocardiogram, and describes having what sounds like a holter monitor which was "inconclusive" but was apparently not recommended for follow up. She denies active pain today. She has had normal EKG since onset, last on 11/01/2018.   She does have hx of allergies/asthma, follows with pulmonology, on singulair, advair daily, she does feel she is wheezing more, using albuterol 3 days a week, doesn't correlate to chest pain episodes.   She has hx of GERD/some dysphagia, follows with Dr. Rosendo Gros,  will be seeing next month, continues with omprazole 40 mg daily.   She endorses anxiety, doesn't necessarily correlate to chest pain episdoes, reports was seeing therapist but not recently, was on  effexor 37.5 mg daily which worked well for her but has been off. She is interested in refill and restarting.   Past Medical History:  Diagnosis Date  . Allergy   . Asthma   . Chronic constipation   . CMT (Charcot-Marie-Tooth disease)   . GERD (gastroesophageal reflux disease)   . 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 Seasonal allergies   . Uterine polyp   . Wears glasses      Allergies  Allergen Reactions  . Linzess [Linaclotide] Rash  . Iodinated Diagnostic Agents Nausea And Vomiting    MRI dye  nausea  . Oxycontin [Oxycodone Hcl] Nausea And Vomiting  . Penicillins Hives  . Shellfish Allergy Other (See Comments)  . Sulfa Antibiotics Hives  . Topamax [Topiramate] Hives    Current Outpatient Medications on File Prior to Visit  Medication Sig  . albuterol (PROAIR HFA) 108 (90 Base) MCG/ACT inhaler Inhale 2 puffs into the lungs every 6 (six) hours as needed for wheezing or shortness of breath.  Marland Kitchen aspirin EC 81 MG tablet Take 81 mg by mouth daily.  . cetirizine (ZYRTEC) 10 MG tablet Take 10 mg by mouth every evening.  . fluticasone (CUTIVATE) 0.05 % cream Apply 1 application topically 2 (two) times daily.   . Fluticasone-Salmeterol (ADVAIR DISKUS) 250-50 MCG/DOSE AEPB Inhale 1 puff into the lungs every 12 (twelve) hours.  . gabapentin (NEURONTIN) 800 MG tablet Take 1 tablet 4 x /day with Meals & Bedtime for Neuropathic Pain  . HYDROcodone-acetaminophen (NORCO) 7.5-325 MG tablet Take 1 tablet by mouth. Takes 1 tablet 3 times a day.  . ketoconazole (NIZORAL) 2 % cream Apply 1 application topically 2 (two) times daily.  . methocarbamol (ROBAXIN) 500 MG tablet Take 1 tablet (500 mg total) by mouth 3 (three) times daily as needed for muscle spasms.  . montelukast (SINGULAIR) 10 MG tablet Take 1 tablet (10 mg total) by mouth at bedtime.  . Olopatadine HCl (PATADAY) 0.2 % SOLN Apply to each eye as needed  . omeprazole  (PRILOSEC) 40 MG capsule Take 1 capsule (40 mg total) by mouth daily.  Marland Kitchen OVER THE COUNTER MEDICATION Fiber laxative 2 tsp twice a day.  . senna (SENOKOT) 8.6 MG tablet Take 1 tablet by mouth daily as needed.   . tretinoin (RETIN-A) 0.025 % gel Apply topically at bedtime.  . Vitamin D, Ergocalciferol, (DRISDOL) 50000 units CAPS capsule Take 1 capsule (50,000 Units total) by mouth every 3 (three) days.   No current facility-administered medications on file prior to visit.    ROS: all negative except above.   Physical Exam:  BP (!) 106/58   Pulse 60   Temp (!) 96.3 F (35.7 C)   Wt 148 lb 3.2 oz (67.2 kg)   LMP 02/13/2016   SpO2 99%   BMI 25.05 kg/m   General Appearance: Well nourished, well dressed AA female in no apparent distress. Eyes: PERRLA, EOMs, conjunctiva no swelling or erythema Sinuses: No Frontal/maxillary tenderness ENT/Mouth: Ext aud canals clear, TMs without erythema, bulging. No erythema, swelling, or exudate on post pharynx.  Tonsils not swollen or erythematous. Hearing normal.  Neck: Supple, thyroid normal.  Respiratory: Respiratory effort normal, BS equal bilaterally without rales, rhonchi, wheezing or stridor.  Cardio:  RRR with no MRGs. Brisk peripheral pulses without edema.  Abdomen: Soft, + BS.  Non tender, no guarding, rebound, hernias, masses. Lymphatics: Non tender without lymphadenopathy.  Musculoskeletal: Full ROM, 5/5 strength, normal gait. She has chest wall tenderness over sternum, ribs anteriorly, costal/chrondral junctions.  Skin: Warm, dry without rashes, lesions, ecchymosis.  Neuro: Normal muscle tone, no cerebellar symptoms. Sensation intact.  Psych: Awake and oriented X 3, anxious affect, Insight and Judgment appropriate.     Izora Ribas, NP 12:44 PM [facility]

## 2019-05-07 ENCOUNTER — Other Ambulatory Visit: Payer: Self-pay

## 2019-05-07 ENCOUNTER — Encounter: Payer: Self-pay | Admitting: Adult Health

## 2019-05-07 ENCOUNTER — Ambulatory Visit
Admission: RE | Admit: 2019-05-07 | Discharge: 2019-05-07 | Disposition: A | Discharge status: Home / Self Care | Payer: Medicare Other | Source: Ambulatory Visit | Attending: Adult Health | Admitting: Adult Health

## 2019-05-07 ENCOUNTER — Ambulatory Visit (INDEPENDENT_AMBULATORY_CARE_PROVIDER_SITE_OTHER): Payer: Medicare Other | Admitting: Adult Health

## 2019-05-07 VITALS — BP 106/58 | HR 60 | Temp 96.3°F | Wt 148.2 lb

## 2019-05-07 DIAGNOSIS — R519 Headache, unspecified: Secondary | ICD-10-CM

## 2019-05-07 DIAGNOSIS — R079 Chest pain, unspecified: Secondary | ICD-10-CM | POA: Diagnosis not present

## 2019-05-07 DIAGNOSIS — R0789 Other chest pain: Secondary | ICD-10-CM | POA: Diagnosis not present

## 2019-05-07 DIAGNOSIS — F419 Anxiety disorder, unspecified: Secondary | ICD-10-CM | POA: Diagnosis not present

## 2019-05-07 MED ORDER — VENLAFAXINE HCL ER 37.5 MG PO CP24: 37.5000 mg | ORAL_CAPSULE | Freq: Every day | ORAL | 1 refills | Status: DC

## 2019-05-07 NOTE — Patient Instructions (Signed)
Try ibuprofen, aleve, advil when having pain   If tender to touch, you can feel confident it is not your heat or lungs     Costochondritis  Costochondritis is swelling and irritation (inflammation) of the tissue (cartilage) that connects your ribs to your breastbone (sternum). This causes pain in the front of your chest. The pain usually starts gradually and involves more than one rib. What are the causes? The exact cause of this condition is not always known. It results from stress on the cartilage where your ribs attach to your sternum. The cause of this stress could be:  Chest injury (trauma).  Exercise or activity, such as lifting.  Severe coughing. What increases the risk? You may be at higher risk for this condition if you:  Are female.  Are 51?50 years old.  Recently started a new exercise or work activity.  Have low levels of vitamin D.  Have a condition that makes you cough frequently. What are the signs or symptoms? The main symptom of this condition is chest pain. The pain:  Usually starts gradually and can be sharp or dull.  Gets worse with deep breathing, coughing, or exercise.  Gets better with rest.  May be worse when you press on the sternum-rib connection (tenderness). How is this diagnosed? This condition is diagnosed based on your symptoms, medical history, and a physical exam. Your health care provider will check for tenderness when pressing on your sternum. This is the most important finding. You may also have tests to rule out other causes of chest pain. These may include:  A chest X-ray to check for lung problems.  An electrocardiogram (ECG) to see if you have a heart problem that could be causing the pain.  An imaging scan to rule out a chest or rib fracture. How is this treated? This condition usually goes away on its own over time. Your health care provider may prescribe an NSAID to reduce pain and inflammation. Your health care provider  may also suggest that you:  Rest and avoid activities that make pain worse.  Apply heat or cold to the area to reduce pain and inflammation.  Do exercises to stretch your chest muscles. If these treatments do not help, your health care provider may inject a numbing medicine at the sternum-rib connection to help relieve the pain. Follow these instructions at home:  Avoid activities that make pain worse. This includes any activities that use chest, abdominal, and side muscles.  If directed, put ice on the painful area: ? Put ice in a plastic bag. ? Place a towel between your skin and the bag. ? Leave the ice on for 20 minutes, 2-3 times a day.  If directed, apply heat to the affected area as often as told by your health care provider. Use the heat source that your health care provider recommends, such as a moist heat pack or a heating pad. ? Place a towel between your skin and the heat source. ? Leave the heat on for 20-30 minutes. ? Remove the heat if your skin turns bright red. This is especially important if you are unable to feel pain, heat, or cold. You may have a greater risk of getting burned.  Take over-the-counter and prescription medicines only as told by your health care provider.  Return to your normal activities as told by your health care provider. Ask your health care provider what activities are safe for you.  Keep all follow-up visits as told by your  health care provider. This is important. Contact a health care provider if:  You have chills or a fever.  Your pain does not go away or it gets worse.  You have a cough that does not go away (is persistent). Get help right away if:  You have shortness of breath. This information is not intended to replace advice given to you by your health care provider. Make sure you discuss any questions you have with your health care provider. Document Revised: 03/30/2017 Document Reviewed: 07/09/2015 Elsevier Patient Education   2020 [employer].

## 2019-05-10 DIAGNOSIS — J3081 Allergic rhinitis due to animal (cat) (dog) hair and dander: Secondary | ICD-10-CM | POA: Diagnosis not present

## 2019-05-10 DIAGNOSIS — J301 Allergic rhinitis due to pollen: Secondary | ICD-10-CM | POA: Diagnosis not present

## 2019-05-10 DIAGNOSIS — J3089 Other allergic rhinitis: Secondary | ICD-10-CM | POA: Diagnosis not present

## 2019-05-18 DIAGNOSIS — J301 Allergic rhinitis due to pollen: Secondary | ICD-10-CM | POA: Diagnosis not present

## 2019-05-18 DIAGNOSIS — J3089 Other allergic rhinitis: Secondary | ICD-10-CM | POA: Diagnosis not present

## 2019-05-18 DIAGNOSIS — J3081 Allergic rhinitis due to animal (cat) (dog) hair and dander: Secondary | ICD-10-CM | POA: Diagnosis not present

## 2019-06-01 DIAGNOSIS — J301 Allergic rhinitis due to pollen: Secondary | ICD-10-CM | POA: Diagnosis not present

## 2019-06-01 DIAGNOSIS — J3081 Allergic rhinitis due to animal (cat) (dog) hair and dander: Secondary | ICD-10-CM | POA: Diagnosis not present

## 2019-06-01 DIAGNOSIS — J3089 Other allergic rhinitis: Secondary | ICD-10-CM | POA: Diagnosis not present

## 2019-06-06 DIAGNOSIS — M549 Dorsalgia, unspecified: Secondary | ICD-10-CM | POA: Diagnosis not present

## 2019-06-06 DIAGNOSIS — G894 Chronic pain syndrome: Secondary | ICD-10-CM | POA: Diagnosis not present

## 2019-06-06 DIAGNOSIS — M542 Cervicalgia: Secondary | ICD-10-CM | POA: Diagnosis not present

## 2019-06-07 ENCOUNTER — Encounter: Payer: Self-pay | Admitting: Cardiology

## 2019-06-07 ENCOUNTER — Other Ambulatory Visit: Payer: Self-pay

## 2019-06-07 ENCOUNTER — Ambulatory Visit (INDEPENDENT_AMBULATORY_CARE_PROVIDER_SITE_OTHER): Payer: Medicare Other | Admitting: Cardiology

## 2019-06-07 VITALS — BP 124/74 | HR 64 | Ht 64.5 in | Wt 145.0 lb

## 2019-06-07 DIAGNOSIS — R072 Precordial pain: Secondary | ICD-10-CM | POA: Diagnosis not present

## 2019-06-07 DIAGNOSIS — R55 Syncope and collapse: Secondary | ICD-10-CM

## 2019-06-07 DIAGNOSIS — E348 Other specified endocrine disorders: Secondary | ICD-10-CM

## 2019-06-07 DIAGNOSIS — R0789 Other chest pain: Secondary | ICD-10-CM | POA: Diagnosis not present

## 2019-06-07 HISTORY — DX: Other specified endocrine disorders: E34.8

## 2019-06-07 NOTE — Progress Notes (Signed)
Cardiology Office Note:    Date:  06/07/2019   ID:  Brandi Erickson, DOB Aug 01, 1970, MRN [id number]  PCP:  Unk Pinto, MD  Cardiologist:  Jenean Lindau, MD   Referring MD: Unk Pinto, MD    ASSESSMENT:    1. Syncope, unspecified syncope type   2. Chest tightness    PLAN:    In order of problems listed above:  1. Chest tightness: I discussed my findings with the patient at extensive length.  I reviewed records.  She has had lipids checked in about 6 months ago and they are unremarkable and I am happy about it.  Her blood pressure is stable she does not do much in terms of risk factors however she is very concerned about her symptoms and we will do a Lexiscan sestamibi to assess this.  She knows to go to the nearest emergency room if she has significant concerns. 2. I discussed lipids with her at extensive length.  If her stress test is negative and she wants further stratification calcium CT score is another option.  Patient had multiple questions which were answered to her satisfaction.   Medication Adjustments/Labs and Tests Ordered: Current medicines are reviewed at length with the patient today.  Concerns regarding medicines are outlined above.  No orders of the defined types were placed in this encounter.  No orders of the defined types were placed in this encounter.    Chief Complaint  Patient presents with  . Follow-up     History of Present Illness:    Brandi Erickson is a 49 y.o. female.  Patient has no significant past medical history.  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  She is doing fine she walks about 2 miles a day on a regular basis.  She mentions to me that she is experiencing chest tightness occasionally and concerned about coronary artery disease and evaluation.  At the time of my evaluation, the patient is alert awake oriented and in no distress.  No radiation of the symptoms to the neck or to the arms.  She mentions to  me that this comes on at times when she is under stress.  It does not recur with sexual activity.  Past Medical History:  Diagnosis Date  . Allergy   . Asthma   . Chronic constipation   . CMT (Charcot-Marie-Tooth disease)   . GERD (gastroesophageal reflux disease)   . 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 Seasonal allergies   . Uterine polyp   . Wears glasses     Past Surgical History:  Procedure Laterality Date  . COLONOSCOPY N/A 11/01/2012   Procedure: COLONOSCOPY;  Surgeon: Rogene Houston, MD;  [facility];  Service: Endoscopy;  Laterality: N/A;  130  . DILATION AND CURETTAGE OF UTERUS    . ESOPHAGEAL MANOMETRY  05/17/2011   Procedure: ESOPHAGEAL MANOMETRY (EM);  Surgeon: Beryle Beams, MD;  Location: [facility];  Service: Endoscopy;  Laterality: N/A;  . FOOT NEUROMA SURGERY Right 06-05-2013  . HYSTEROSCOPY WITH D & C N/A 06/27/2013   Procedure: HYSTEROSCOPY POLPYECTOMY  ;  Surgeon: Governor Specking, MD;  Location: [place];  Service: Gynecology;  Laterality: N/A;  . LAPAROSCOPIC TOTAL HYSTERECTOMY  2018   [facility]  . LAPAROSCOPY N/A 06/27/2013   Procedure: LAPAROSCOPY WITH extensive lysis of adhesions;  Surgeon: Governor Specking, MD;  Location: [facility];  Service: Gynecology;  Laterality: N/A;  . TONSILLECTOMY    . TONSILLECTOMY  as child  . TUBAL LIGATION  1999  . tubal reconstruction  1997    Current Medications: Current Meds  Medication Sig  . albuterol (PROAIR HFA) 108 (90 Base) MCG/ACT inhaler Inhale 2 puffs into the lungs every 6 (six) hours as needed for wheezing or shortness of breath.  Marland Kitchen aspirin EC 81 MG tablet Take 81 mg by mouth daily.  . cetirizine (ZYRTEC) 10 MG tablet Take 10 mg by mouth every evening.  Marland Kitchen EPINEPHrine 0.3 mg/0.3 mL IJ SOAJ injection   . fluticasone (CUTIVATE) 0.05 % cream Apply 1 application topically 2 (two) times daily.   .  Fluticasone-Salmeterol (ADVAIR DISKUS) 250-50 MCG/DOSE AEPB Inhale 1 puff into the lungs every 12 (twelve) hours.  . gabapentin (NEURONTIN) 800 MG tablet Take 1 tablet 4 x /day with Meals & Bedtime for Neuropathic Pain  . HYDROcodone-acetaminophen (NORCO) 7.5-325 MG tablet Take 1 tablet by mouth. Takes 1 tablet 3 times a day.  . ketoconazole (NIZORAL) 2 % cream Apply 1 application topically 2 (two) times daily.  . methocarbamol (ROBAXIN) 500 MG tablet Take 1 tablet (500 mg total) by mouth 3 (three) times daily as needed for muscle spasms.  . montelukast (SINGULAIR) 10 MG tablet Take 1 tablet (10 mg total) by mouth at bedtime.  . Olopatadine HCl (PATADAY) 0.2 % SOLN Apply to each eye as needed  . omeprazole (PRILOSEC) 40 MG capsule Take 1 capsule (40 mg total) by mouth daily.  Marland Kitchen OVER THE COUNTER MEDICATION Fiber laxative 2 tsp twice a day.  . senna (SENOKOT) 8.6 MG tablet Take 1 tablet by mouth daily as needed.   . tretinoin (RETIN-A) 0.025 % gel Apply topically at bedtime.  Marland Kitchen venlafaxine XR (EFFEXOR XR) 37.5 MG 24 hr capsule Take 1 capsule (37.5 mg total) by mouth daily with breakfast.  . Vitamin D, Ergocalciferol, (DRISDOL) 50000 units CAPS capsule Take 1 capsule (50,000 Units total) by mouth every 3 (three) days.     Allergies:   Linzess [linaclotide], Iodinated diagnostic agents, Oxycontin [oxycodone hcl], Penicillins, Shellfish allergy, Sulfa antibiotics, and Topamax [topiramate]   Social History   Socioeconomic History  . Marital status: Single    Spouse name: Not on file  . Number of children: Not on file  . Years of education: Not on file  . Highest education level: Not on file  Occupational History  . Not on file  Tobacco Use  . Smoking status: Never Smoker  . Smokeless tobacco: Never Used  Substance and Sexual Activity  . Alcohol use: No  . Drug use: No  . Sexual activity: Yes  Other Topics Concern  . Not on file  Social History Narrative   ** Merged History Encounter **        Social Determinants of Health   Financial Resource Strain:   . Difficulty of Paying Living Expenses:   Food Insecurity:   . Worried About Charity fundraiser in the Last Year:   . Arboriculturist in the Last Year:   Transportation Needs:   . Film/video editor (Medical):   Marland Kitchen Lack of Transportation (Non-Medical):   Physical Activity:   . Days of Exercise per Week:   . Minutes of Exercise per Session:   Stress:   . Feeling of Stress :   Social Connections:   . Frequency of Communication with Friends and Family:   . Frequency of  Social Gatherings with Friends and Family:   . Attends Religious Services:   . Active Member of Clubs or Organizations:   . Attends Archivist Meetings:   Marland Kitchen Marital Status:      Family History: The patient's family history includes Brain cancer in her sister; Breast cancer in her sister.  ROS:   Please see the history of present illness.    All other systems reviewed and are negative.  EKGs/Labs/Other Studies Reviewed:    The following studies were reviewed today: EKG reveals sinus rhythm and nonspecific ST-T changes   Recent Labs: 11/02/2018: ALT 21; BUN 8; Creat 0.73; Hemoglobin 12.8; Magnesium 1.9; Platelets 363; Potassium 3.9; Sodium 136; TSH 1.10  Recent Lipid Panel    Component Value Date/Time   CHOL 172 11/02/2018 1612   TRIG 64 11/02/2018 1612   HDL 62 11/02/2018 1612   CHOLHDL 2.8 11/02/2018 1612   VLDL 16 09/16/2016 0919   LDLCALC 95 11/02/2018 1612    Physical Exam:    VS:  BP 124/74   Pulse 64   Ht 5' 4.5" (1.638 m)   Wt 145 lb (65.8 kg)   LMP 02/13/2016   SpO2 99%   BMI 24.50 kg/m     Wt Readings from Last 3 Encounters:  06/07/19 145 lb (65.8 kg)  05/07/19 148 lb 3.2 oz (67.2 kg)  11/02/18 147 lb 3.2 oz (66.8 kg)     GEN: Patient is in no acute distress HEENT: Normal NECK: No JVD; No carotid bruits LYMPHATICS: No lymphadenopathy CARDIAC: Hear sounds regular, 2/6 systolic murmur at the  apex. RESPIRATORY:  Clear to auscultation without rales, wheezing or rhonchi  ABDOMEN: Soft, non-tender, non-distended MUSCULOSKELETAL:  No edema; No deformity  SKIN: Warm and dry NEUROLOGIC:  Alert and oriented x 3 PSYCHIATRIC:  Normal affect   Signed, Jenean Lindau, MD  06/07/2019 9:19 AM    [place]

## 2019-06-07 NOTE — Addendum Note (Signed)
Addended by: Truddie Hidden on: 06/07/2019 09:30 AM   Modules accepted: Orders

## 2019-06-07 NOTE — Addendum Note (Signed)
Addended by: Ronaldo Miyamoto on: 06/07/2019 09:39 AM   Modules accepted: Orders

## 2019-06-07 NOTE — Patient Instructions (Signed)
Medication Instructions:  No medication changes *If you need a refill on your cardiac medications before your next appointment, please call your pharmacy*   Lab Work: None ordered If you have labs (blood work) drawn today and your tests are completely normal, you will receive your results only by: Marland Kitchen MyChart Message (if you have MyChart) OR . A paper copy in the mail If you have any lab test that is abnormal or we need to change your treatment, we will call you to review the results.   Testing/Procedures: Your physician has requested that you have a lexiscan myoview. For further information please visit [email or web address]. Please follow instruction sheet, as given.  The test will take approximately 3 to 4 hours to complete; you may bring reading material.  If someone comes with you to your appointment, they will need to remain in the main lobby due to limited space in the testing area. **If you are pregnant or breastfeeding, please notify the nuclear lab prior to your appointment**  How to prepare for your Myocardial Perfusion Test: . Do not eat or drink 3 hours prior to your test, except you may have water. . Do not consume products containing caffeine (regular or decaffeinated) 12 hours prior to your test. (ex: coffee, chocolate, sodas, tea). . Do bring a list of your current medications with you.  If not listed below, you may take your medications as normal. . Do wear comfortable clothes (no dresses or overalls) and walking shoes, tennis shoes preferred (No heels or open toe shoes are allowed). . Do NOT wear cologne, perfume, aftershave, or lotions (deodorant is allowed). . If these instructions are not followed, your test will have to be rescheduled.    Follow-Up: At [facility], you and your health needs are our priority.  As part of our continuing mission to provide you with exceptional heart care, we have created designated Provider Care Teams.  These Care Teams include your  primary Cardiologist (physician) and Advanced Practice Providers (APPs -  Physician Assistants and Nurse Practitioners) who all work together to provide you with the care you need, when you need it.  We recommend signing up for the patient portal called "MyChart".  Sign up information is provided on this After Visit Summary.  MyChart is used to connect with patients for Virtual Visits (Telemedicine).  Patients are able to view lab/test results, encounter notes, upcoming appointments, etc.  Non-urgent messages can be sent to your provider as well.   To learn more about what you can do with MyChart, go to [email or web address].    Your next appointment:   1 month(s)  The format for your next appointment:   In Person  Provider:   Jyl Heinz, MD   Other Instructions NA

## 2019-06-08 DIAGNOSIS — J301 Allergic rhinitis due to pollen: Secondary | ICD-10-CM | POA: Diagnosis not present

## 2019-06-08 DIAGNOSIS — J3089 Other allergic rhinitis: Secondary | ICD-10-CM | POA: Diagnosis not present

## 2019-06-08 DIAGNOSIS — J3081 Allergic rhinitis due to animal (cat) (dog) hair and dander: Secondary | ICD-10-CM | POA: Diagnosis not present

## 2019-06-15 ENCOUNTER — Telehealth (HOSPITAL_COMMUNITY): Payer: Self-pay

## 2019-06-15 NOTE — Telephone Encounter (Signed)
Encounter complete. 

## 2019-06-19 ENCOUNTER — Ambulatory Visit (INDEPENDENT_AMBULATORY_CARE_PROVIDER_SITE_OTHER): Payer: Medicare Other | Admitting: Internal Medicine

## 2019-06-19 ENCOUNTER — Encounter (INDEPENDENT_AMBULATORY_CARE_PROVIDER_SITE_OTHER): Payer: Self-pay | Admitting: Internal Medicine

## 2019-06-19 ENCOUNTER — Other Ambulatory Visit: Payer: Self-pay

## 2019-06-19 ENCOUNTER — Encounter (INDEPENDENT_AMBULATORY_CARE_PROVIDER_SITE_OTHER): Payer: Self-pay | Admitting: *Deleted

## 2019-06-19 VITALS — BP 119/78 | HR 53 | Temp 97.2°F | Ht 66.0 in | Wt 145.0 lb

## 2019-06-19 DIAGNOSIS — K219 Gastro-esophageal reflux disease without esophagitis: Secondary | ICD-10-CM

## 2019-06-19 DIAGNOSIS — R1310 Dysphagia, unspecified: Secondary | ICD-10-CM

## 2019-06-19 DIAGNOSIS — R1319 Other dysphagia: Secondary | ICD-10-CM

## 2019-06-19 MED ORDER — PANTOPRAZOLE SODIUM 40 MG PO TBEC: 40.0000 mg | DELAYED_RELEASE_TABLET | Freq: Two times a day (BID) | ORAL | 2 refills | Status: DC

## 2019-06-19 NOTE — Progress Notes (Signed)
Presenting complaint;  Persistent heartburn despite taking medication.  Swallowing difficulty.  Database and subjective:  Patient is 49 year old Afro-American female with history of Charcot-Marie-Tooth disease chronic constipation as well as GERD who is here for scheduled visit.  She was last seen 1 year ago for abdominal pain. She is not complaining of heartburn multiple times a day as well as burning in her throat.  At times she feels something is stuck in her throat and she has difficulty with swallowing.  She says omeprazole is not working anymore.  She has experienced nausea but no vomiting.  She also has been using Tums frequently but that has not helped either.  She feels she is doing better as far as her constipation is concerned her bowels move every other day.  She denies melena or rectal bleeding.  She says she has changed her eating habits she does not drink colas anymore.  She just drinks water and very rarely will eat fatty or fried food.  She eats her supper at 5 PM and goes to bed around 11 PM.  She is taking pain medication because of back pain.  Current Medications: Outpatient Encounter Medications as of 06/19/2019  Medication Sig  . albuterol (PROAIR HFA) 108 (90 Base) MCG/ACT inhaler Inhale 2 puffs into the lungs every 6 (six) hours as needed for wheezing or shortness of breath.  Marland Kitchen aspirin EC 81 MG tablet Take 81 mg by mouth daily.  . cetirizine (ZYRTEC) 10 MG tablet Take 10 mg by mouth every evening.  Marland Kitchen EPINEPHrine 0.3 mg/0.3 mL IJ SOAJ injection Inject 0.3 mg into the muscle as needed.   . fluticasone (CUTIVATE) 0.05 % cream Apply 1 application topically 2 (two) times daily.   . Fluticasone-Salmeterol (ADVAIR DISKUS) 250-50 MCG/DOSE AEPB Inhale 1 puff into the lungs every 12 (twelve) hours.  . gabapentin (NEURONTIN) 800 MG tablet Take 1 tablet 4 x /day with Meals & Bedtime for Neuropathic Pain  . HYDROcodone-acetaminophen (NORCO) 7.5-325 MG tablet Take 1 tablet by mouth. Takes  1 tablet 3 times a day.  . methocarbamol (ROBAXIN) 500 MG tablet Take 1 tablet (500 mg total) by mouth 3 (three) times daily as needed for muscle spasms.  . montelukast (SINGULAIR) 10 MG tablet Take 1 tablet (10 mg total) by mouth at bedtime.  . Olopatadine HCl (PATADAY) 0.2 % SOLN Apply to each eye as needed  . omeprazole (PRILOSEC) 40 MG capsule Take 1 capsule (40 mg total) by mouth daily.  Marland Kitchen OVER THE COUNTER MEDICATION Fiber laxative 2 tsp twice a day.  . senna (SENOKOT) 8.6 MG tablet Take 1 tablet by mouth daily as needed.   . tretinoin (RETIN-A) 0.025 % gel Apply topically at bedtime.  Marland Kitchen venlafaxine XR (EFFEXOR XR) 37.5 MG 24 hr capsule Take 1 capsule (37.5 mg total) by mouth daily with breakfast.  . Vitamin D, Ergocalciferol, (DRISDOL) 50000 units CAPS capsule Take 1 capsule (50,000 Units total) by mouth every 3 (three) days.  . [DISCONTINUED] ketoconazole (NIZORAL) 2 % cream Apply 1 application topically 2 (two) times daily.   No facility-administered encounter medications on file as of 06/19/2019.     Objective: Blood pressure 119/78, pulse (!) 53, temperature (!) 97.2 F (36.2 C), temperature source Temporal, height 5\' 6"  (1.676 m), weight 145 lb (65.8 kg), last menstrual period 02/13/2016. Patient is alert and in no acute distress. She is wearing a facial mask. Conjunctiva is pink. Sclera is nonicteric Oropharyngeal mucosa is normal. No neck masses or thyromegaly noted. Cardiac  exam with regular rhythm normal S1 and S2. No murmur or gallop noted. Lungs are clear to auscultation. Abdomen is symmetrical.  On palpation is soft with mild tenderness at LLQ and LUQ.  No guarding rebound organomegaly or masses. No LE edema or clubbing noted.   Assessment:  #1.  Chronic GERD.  She is aggressively pursuing lifestyle modifications along with omeprazole 40 mg daily but it is not helping.  I wonder if she has underlying gastroparesis may be caused by her medications.  #2.  Esophageal  dysphagia.  She could have esophageal motility disorder but need to rule out stricture or ring.   Plan:  Discontinue omeprazole. Pantoprazole 40 mg by mouth 30 minutes before breakfast and evening meal daily. Barium pill esophagogram to be scheduled. Office visit in 3 months.

## 2019-06-19 NOTE — Patient Instructions (Signed)
Physician will call the results of barium study when completed.

## 2019-06-20 ENCOUNTER — Ambulatory Visit (HOSPITAL_COMMUNITY)
Admission: RE | Admit: 2019-06-20 | Discharge: 2019-06-20 | Disposition: A | Discharge status: Home / Self Care | Payer: Medicare Other | Source: Ambulatory Visit | Attending: Cardiology | Admitting: Cardiology

## 2019-06-20 DIAGNOSIS — R072 Precordial pain: Secondary | ICD-10-CM | POA: Insufficient documentation

## 2019-06-20 LAB — MYOCARDIAL PERFUSION IMAGING
LV dias vol: 87 mL (ref 46–106)
LV sys vol: 39 mL
Peak HR: 120 {beats}/min
Rest HR: 48 {beats}/min
SDS: 0
SRS: 0
SSS: 0
TID: 1.1

## 2019-06-20 MED ORDER — REGADENOSON 0.4 MG/5ML IV SOLN
0.4000 mg | Freq: Once | INTRAVENOUS | Status: AC
  Administered 2019-06-20: 0.4 mg via INTRAVENOUS

## 2019-06-20 MED ORDER — TECHNETIUM TC 99M TETROFOSMIN IV KIT
30.2000 | PACK | Freq: Once | INTRAVENOUS | Status: AC | PRN
  Administered 2019-06-20: 30.2 via INTRAVENOUS
  Filled 2019-06-20: qty 31

## 2019-06-20 MED ORDER — TECHNETIUM TC 99M TETROFOSMIN IV KIT
9.8000 | PACK | Freq: Once | INTRAVENOUS | Status: AC | PRN
  Administered 2019-06-20: 9.8 via INTRAVENOUS
  Filled 2019-06-20: qty 10

## 2019-06-22 DIAGNOSIS — J3089 Other allergic rhinitis: Secondary | ICD-10-CM | POA: Diagnosis not present

## 2019-06-22 DIAGNOSIS — J301 Allergic rhinitis due to pollen: Secondary | ICD-10-CM | POA: Diagnosis not present

## 2019-06-22 DIAGNOSIS — J3081 Allergic rhinitis due to animal (cat) (dog) hair and dander: Secondary | ICD-10-CM | POA: Diagnosis not present

## 2019-06-27 ENCOUNTER — Ambulatory Visit (HOSPITAL_COMMUNITY): Payer: Medicare Other

## 2019-06-27 DIAGNOSIS — J4520 Mild intermittent asthma, uncomplicated: Secondary | ICD-10-CM | POA: Diagnosis not present

## 2019-06-28 DIAGNOSIS — J3081 Allergic rhinitis due to animal (cat) (dog) hair and dander: Secondary | ICD-10-CM | POA: Diagnosis not present

## 2019-06-28 DIAGNOSIS — J4530 Mild persistent asthma, uncomplicated: Secondary | ICD-10-CM | POA: Diagnosis not present

## 2019-06-28 DIAGNOSIS — J3089 Other allergic rhinitis: Secondary | ICD-10-CM | POA: Diagnosis not present

## 2019-06-28 DIAGNOSIS — H1045 Other chronic allergic conjunctivitis: Secondary | ICD-10-CM | POA: Diagnosis not present

## 2019-06-28 DIAGNOSIS — J301 Allergic rhinitis due to pollen: Secondary | ICD-10-CM | POA: Diagnosis not present

## 2019-07-05 DIAGNOSIS — J3089 Other allergic rhinitis: Secondary | ICD-10-CM | POA: Diagnosis not present

## 2019-07-05 DIAGNOSIS — J3081 Allergic rhinitis due to animal (cat) (dog) hair and dander: Secondary | ICD-10-CM | POA: Diagnosis not present

## 2019-07-06 ENCOUNTER — Encounter: Payer: Self-pay | Admitting: Cardiology

## 2019-07-06 ENCOUNTER — Other Ambulatory Visit: Payer: Self-pay

## 2019-07-06 ENCOUNTER — Ambulatory Visit (INDEPENDENT_AMBULATORY_CARE_PROVIDER_SITE_OTHER): Payer: Medicare Other | Admitting: Cardiology

## 2019-07-06 VITALS — BP 118/70 | HR 60 | Ht 65.0 in | Wt 145.0 lb

## 2019-07-06 DIAGNOSIS — R55 Syncope and collapse: Secondary | ICD-10-CM

## 2019-07-06 NOTE — Patient Instructions (Signed)
Medication Instructions:  No medication changes *If you need a refill on your cardiac medications before your next appointment, please call your pharmacy*   Lab Work: None ordered If you have labs (blood work) drawn today and your tests are completely normal, you will receive your results only by: Marland Kitchen MyChart Message (if you have MyChart) OR . A paper copy in the mail If you have any lab test that is abnormal or we need to change your treatment, we will call you to review the results.   Testing/Procedures:  We will order CT coronary calcium score $150  Please call [phone] to schedule   [facility]  [address], [place] [address]    Follow-Up: At [facility], you and your health needs are our priority.  As part of our continuing mission to provide you with exceptional heart care, we have created designated Provider Care Teams.  These Care Teams include your primary Cardiologist (physician) and Advanced Practice Providers (APPs -  Physician Assistants and Nurse Practitioners) who all work together to provide you with the care you need, when you need it.  We recommend signing up for the patient portal called "MyChart".  Sign up information is provided on this After Visit Summary.  MyChart is used to connect with patients for Virtual Visits (Telemedicine).  Patients are able to view lab/test results, encounter notes, upcoming appointments, etc.  Non-urgent messages can be sent to your provider as well.   To learn more about what you can do with MyChart, go to [email or web address].    Your next appointment:   12 month(s)  The format for your next appointment:   In Person  Provider:   Jyl Heinz, MD   Other Instructions NA

## 2019-07-06 NOTE — Progress Notes (Signed)
Cardiology Office Note:    Date:  07/06/2019   ID:  Brandi Erickson, DOB 02/23/1971, MRN [id number]  PCP:  Unk Pinto, MD  Cardiologist:  Jenean Lindau, MD   Referring MD: Unk Pinto, MD    ASSESSMENT:    1. Syncope, unspecified syncope type    PLAN:    In order of problems listed above:  1. Chest discomfort: Patient symptoms have resolved and she is very active lady.  He is exercising on a regular basis.  I reviewed lipids from recently.  They were fine.  Also I discussed with her about calcium scoring for his stratification and she is agreeable.  We will set her up for 1.  Patient had multiple questions which were answered to her satisfaction. 2. Patient will be seen in follow-up appointment in 12 months or earlier if the patient has any concerns    Medication Adjustments/Labs and Tests Ordered: Current medicines are reviewed at length with the patient today.  Concerns regarding medicines are outlined above.  No orders of the defined types were placed in this encounter.  No orders of the defined types were placed in this encounter.    Chief Complaint  Patient presents with  . Follow-up     History of Present Illness:    Brandi Erickson is a 49 y.o. female.  Was evaluated for chest discomfort this has resolved.  Stress test was unremarkable she walks more than 2 miles a day on a daily basis.  No chest pain orthopnea or PND.  At the time of my evaluation, the patient is alert awake oriented and in no distress.  Past Medical History:  Diagnosis Date  . Allergy   . Asthma   . Chronic constipation   . CMT (Charcot-Marie-Tooth disease)   . GERD (gastroesophageal reflux disease)   . 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 Seasonal allergies   . Uterine polyp   . Wears glasses     Past Surgical History:  Procedure Laterality Date  . COLONOSCOPY N/A 11/01/2012   Procedure: COLONOSCOPY;  Surgeon:  Rogene Houston, MD;  [facility];  Service: Endoscopy;  Laterality: N/A;  130  . DILATION AND CURETTAGE OF UTERUS    . ESOPHAGEAL MANOMETRY  05/17/2011   Procedure: ESOPHAGEAL MANOMETRY (EM);  Surgeon: Beryle Beams, MD;  Location: [facility];  Service: Endoscopy;  Laterality: N/A;  . FOOT NEUROMA SURGERY Right 06-05-2013  . HYSTEROSCOPY WITH D & C N/A 06/27/2013   Procedure: HYSTEROSCOPY POLPYECTOMY  ;  Surgeon: Governor Specking, MD;  Location: [facility];  Service: Gynecology;  Laterality: N/A;  . LAPAROSCOPIC TOTAL HYSTERECTOMY  2018   [facility]  . LAPAROSCOPY N/A 06/27/2013   Procedure: LAPAROSCOPY WITH extensive lysis of adhesions;  Surgeon: Governor Specking, MD;  Location: [facility];  Service: Gynecology;  Laterality: N/A;  . TONSILLECTOMY    . TONSILLECTOMY  as child  . TUBAL LIGATION  1999  . tubal reconstruction  1997    Current Medications: Current Meds  Medication Sig  . albuterol (PROAIR HFA) 108 (90 Base) MCG/ACT inhaler Inhale 2 puffs into the lungs every 6 (six) hours as needed for wheezing or shortness of breath.  Marland Kitchen aspirin EC 81 MG tablet Take 81 mg by mouth daily.  Marland Kitchen azelastine (OPTIVAR) 0.05 % ophthalmic solution   . BREO ELLIPTA 200-25 MCG/INH AEPB Inhale 1 puff into the lungs daily.  Marland Kitchen  cetirizine (ZYRTEC) 10 MG tablet Take 10 mg by mouth every evening.  Marland Kitchen EPINEPHrine 0.3 mg/0.3 mL IJ SOAJ injection Inject 0.3 mg into the muscle as needed.   . fluticasone (CUTIVATE) 0.05 % cream Apply 1 application topically 2 (two) times daily.   . fluticasone (FLONASE) 50 MCG/ACT nasal spray   . Fluticasone-Salmeterol (ADVAIR DISKUS) 250-50 MCG/DOSE AEPB Inhale 1 puff into the lungs every 12 (twelve) hours.  . gabapentin (NEURONTIN) 800 MG tablet Take 1 tablet 4 x /day with Meals & Bedtime for Neuropathic Pain  . HYDROcodone-acetaminophen (NORCO) 7.5-325 MG tablet Take 1 tablet by mouth. Takes 1 tablet 3 times a day.  . methocarbamol  (ROBAXIN) 500 MG tablet Take 1 tablet (500 mg total) by mouth 3 (three) times daily as needed for muscle spasms.  . montelukast (SINGULAIR) 10 MG tablet Take 1 tablet (10 mg total) by mouth at bedtime.  . Olopatadine HCl (PATADAY) 0.2 % SOLN Apply to each eye as needed  . OVER THE COUNTER MEDICATION Fiber laxative 2 tsp twice a day.  . pantoprazole (PROTONIX) 40 MG tablet Take 1 tablet (40 mg total) by mouth 2 (two) times daily before a meal.  . senna (SENOKOT) 8.6 MG tablet Take 1 tablet by mouth daily as needed.   . tretinoin (RETIN-A) 0.025 % gel Apply topically at bedtime.  Marland Kitchen venlafaxine XR (EFFEXOR XR) 37.5 MG 24 hr capsule Take 1 capsule (37.5 mg total) by mouth daily with breakfast.  . Vitamin D, Ergocalciferol, (DRISDOL) 50000 units CAPS capsule Take 1 capsule (50,000 Units total) by mouth every 3 (three) days. (Patient taking differently: Take 50,000 Units by mouth every 7 (seven) days. )     Allergies:   Peanut-containing drug products, Linzess [linaclotide], Iodinated diagnostic agents, Oxycontin [oxycodone hcl], Penicillins, Shellfish allergy, Sulfa antibiotics, and Topamax [topiramate]   Social History   Socioeconomic History  . Marital status: Single    Spouse name: Not on file  . Number of children: Not on file  . Years of education: Not on file  . Highest education level: Not on file  Occupational History  . Not on file  Tobacco Use  . Smoking status: Never Smoker  . Smokeless tobacco: Never Used  Substance and Sexual Activity  . Alcohol use: No  . Drug use: No  . Sexual activity: Yes  Other Topics Concern  . Not on file  Social History Narrative   ** Merged History Encounter **       Social Determinants of Health   Financial Resource Strain:   . Difficulty of Paying Living Expenses:   Food Insecurity:   . Worried About Charity fundraiser in the Last Year:   . Arboriculturist in the Last Year:   Transportation Needs:   . Film/video editor (Medical):     Marland Kitchen Lack of Transportation (Non-Medical):   Physical Activity:   . Days of Exercise per Week:   . Minutes of Exercise per Session:   Stress:   . Feeling of Stress :   Social Connections:   . Frequency of Communication with Friends and Family:   . Frequency of Social Gatherings with Friends and Family:   . Attends Religious Services:   . Active Member of Clubs or Organizations:   . Attends Archivist Meetings:   Marland Kitchen Marital Status:      Family History: The patient's family history includes Brain cancer in her sister; Breast cancer in her sister.  ROS:  Please see the history of present illness.    All other systems reviewed and are negative.  EKGs/Labs/Other Studies Reviewed:    The following studies were reviewed today: Study Highlights   The left ventricular ejection fraction is normal (55-65%).  Nuclear stress EF: 56%.  T wave inversion was noted during stress in the II, III, aVF, V4, V5 and V6 leads, and returning to baseline after less than 1 min of recovery.  The study is normal.  This is a low risk study.  Patient developed sinus tachycardia with inferolateral T wave inversions with the infusion of Regadenoson.      Recent Labs: 11/02/2018: ALT 21; BUN 8; Creat 0.73; Hemoglobin 12.8; Magnesium 1.9; Platelets 363; Potassium 3.9; Sodium 136; TSH 1.10  Recent Lipid Panel    Component Value Date/Time   CHOL 172 11/02/2018 1612   TRIG 64 11/02/2018 1612   HDL 62 11/02/2018 1612   CHOLHDL 2.8 11/02/2018 1612   VLDL 16 09/16/2016 0919   LDLCALC 95 11/02/2018 1612    Physical Exam:    VS:  BP 118/70   Pulse 60   Ht 5\' 5"  (1.651 m)   Wt 145 lb (65.8 kg)   LMP 02/13/2016   SpO2 98%   BMI 24.13 kg/m     Wt Readings from Last 3 Encounters:  07/06/19 145 lb (65.8 kg)  06/20/19 145 lb (65.8 kg)  06/19/19 145 lb (65.8 kg)     GEN: Patient is in no acute distress HEENT: Normal NECK: No JVD; No carotid bruits LYMPHATICS: No  lymphadenopathy CARDIAC: Hear sounds regular, 2/6 systolic murmur at the apex. RESPIRATORY:  Clear to auscultation without rales, wheezing or rhonchi  ABDOMEN: Soft, non-tender, non-distended MUSCULOSKELETAL:  No edema; No deformity  SKIN: Warm and dry NEUROLOGIC:  Alert and oriented x 3 PSYCHIATRIC:  Normal affect   Signed, Jenean Lindau, MD  07/06/2019 8:23 AM    [place]

## 2019-07-13 ENCOUNTER — Encounter: Payer: Self-pay | Admitting: Physician Assistant

## 2019-07-13 NOTE — Progress Notes (Signed)
MEDICARE ANNUAL WELLNESS VISIT AND FU  Assessment:    Right lower quadrant abdominal pain -     Ambulatory referral to Physical Therapy Has had negative CT AB, s/p hysterectomy, had recent normal visit with GYN with normal pelvic US.  ? Chronic right lower AB tenderness/pain and constipation from pelvic floor- will refer for evaluation Get on miralax, will refer for colonoscopy. If both of these negative? Adhesions.    Constipation, unspecified constipation type -     Ambulatory referral to Physical Therapy -     Ambulatory referral to Gastroenterology -     polyethylene glycol (MIRALAX / GLYCOLAX) 17 g packet; Take 17 g by mouth daily. Get colonoscopy- has seen Dr. Benson Norway in the past for EGD Switch fiber to miralax ? Chronic right lower AB tenderness/pain and constipation from pelvic floor- will refer for evaluation  Colon cancer screening -     Ambulatory referral to Gastroenterology  CMT (Charcot-Marie-Tooth disease) Continue follow up with pain management, neuro  Other fatigue Check labs Suggest sleep study since patient does not have restorative sleep and HA ? Chronic fatigue/depression  Mixed hyperlipidemia -continue medications, check lipids, decrease fatty foods, increase activity.   Medication management  Hypertension, unspecified type - continue medications, DASH diet, exercise and monitor at home. Call if greater than 130/80.   Abnormal glucose monitor  Pain in joint, multiple sites. - continue follow up pain management  Vitamin D deficiency - will cut back  Gastroesophageal reflux disease, esophagitis presence not specified -   Continue PPI/H2 blocker, diet discussed  Chronic asthma without complication, unspecified asthma severity, unspecified whether persistent -    Continue meds  Encounter for Medicare annual wellness exam 1 year Get MGM  BMI 24.0-24.9, adult - increase veggies, decrease carbs - long discussion about weight loss, diet, and  exercise   Over 40 minutes of exam, counseling, chart review and critical decision making was performed Future Appointments  Date Time Provider [place]  09/19/2019  2:00 PM Minus Liberty, PA-C [facility] None  12/12/2019  3:00 PM Unk Pinto, MD [facility] None     Plan:   During the course of the visit the patient was educated and counseled about appropriate screening and preventive services including:    Pneumococcal vaccine   Prevnar 13  Influenza vaccine  Td vaccine  Screening electrocardiogram  Bone densitometry screening  Colorectal cancer screening  Diabetes screening  Glaucoma screening  Nutrition counseling   Advanced directives: requested   Subjective:  Brandi Erickson is a 49 y.o. female who presents for Medicare Annual Wellness Visit and follow up for predM, chol, HTN.  Her blood pressure has been controlled at home, today their BP is BP: 124/82  She has chronic pain, follows with Dr. Andree Elk preferred pain. Has history of CMT, neck pain. Has TMJ and has new mouth piece from dentist. Has has a normal sleep study with Dr. Shela Leff 09/27/2018.   Has history of hemorrhoids s/p surgery at [facility].   She has history of GERD, following with Dr. Laural Golden in GI, on PPI, getting work up there, doing lifestyle modification.   She has been having lower right sided sharp twisting AB pain x years, prior to her partial hysterectomy/left salpingectomy in 2018.  Happens once every 1-2 weeks, 1 minute, very sharp,stops her in her track, non exertional. Increasing in intensity but not frequency, feels like labor pain.   Saw. Dr. Jerl Santos GYN recently, had normal AB [place] and exam.  BM every  other day, she has incomplete evacuation. No gas, no bloating. Regular BM.  She has had ectopic pregnancy and hysterectomy  She had normal CT AB 04/2018 which was normal.  She is on fiber daily.  EGD normal with Dr. Benson Norway in 2013, patient states Dr. Corbin Ade did one  according to patient.  Very rarely sexually active, no pain with intercourse.  No incontinence or leakage but she pees often due to frequency.    She does workout, walks 2 miles a day.  She denies chest pain, shortness of breath, dizziness.  Saw Dr. Geraldo Pitter in cardiology for possible syncopal episodes, had normal stress test 06/20/2019.     She is not on cholesterol medication and denies myalgias. Her cholesterol is at goal. The cholesterol last visit was:   Lab Results  Component Value Date   CHOL 172 11/02/2018   HDL 62 11/02/2018   LDLCALC 95 11/02/2018   TRIG 64 11/02/2018   CHOLHDL 2.8 11/02/2018    She has been working on diet and exercise for prediabetes, and denies polydipsia, polyuria and visual disturbances. Last A1C in the office was:  Lab Results  Component Value Date   HGBA1C 5.7 (H) 11/02/2018   Patient is on Vitamin D supplement.   Lab Results  Component Value Date   VD25OH 122 (H) 11/02/2018       Medication Review:   Current Outpatient Medications (Cardiovascular):  Marland Kitchen  EPINEPHrine 0.3 mg/0.3 mL IJ SOAJ injection, Inject 0.3 mg into the muscle as needed.   Current Outpatient Medications (Respiratory):  .  albuterol (PROAIR HFA) 108 (90 Base) MCG/ACT inhaler, Inhale 2 puffs into the lungs every 6 (six) hours as needed for wheezing or shortness of breath. Marland Kitchen  BREO ELLIPTA 200-25 MCG/INH AEPB, Inhale 1 puff into the lungs daily. .  cetirizine (ZYRTEC) 10 MG tablet, Take 10 mg by mouth every evening. .  fluticasone (FLONASE) 50 MCG/ACT nasal spray,  .  Fluticasone-Salmeterol (ADVAIR DISKUS) 250-50 MCG/DOSE AEPB, Inhale 1 puff into the lungs every 12 (twelve) hours. .  montelukast (SINGULAIR) 10 MG tablet, Take 1 tablet (10 mg total) by mouth at bedtime.  Current Outpatient Medications (Analgesics):  .  aspirin EC 81 MG tablet, Take 81 mg by mouth daily. Marland Kitchen  HYDROcodone-acetaminophen (NORCO) 7.5-325 MG tablet, Take 1 tablet by mouth. Takes 1 tablet 3 times a  day.   Current Outpatient Medications (Other):  .  azelastine (OPTIVAR) 0.05 % ophthalmic solution,  .  fluticasone (CUTIVATE) 0.05 % cream, Apply 1 application topically 2 (two) times daily.  Marland Kitchen  gabapentin (NEURONTIN) 800 MG tablet, Take 1 tablet 4 x /day with Meals & Bedtime for Neuropathic Pain .  methocarbamol (ROBAXIN) 500 MG tablet, Take 1 tablet (500 mg total) by mouth 3 (three) times daily as needed for muscle spasms. .  Olopatadine HCl (PATADAY) 0.2 % SOLN, Apply to each eye as needed .  OVER THE COUNTER MEDICATION, Fiber laxative 2 tsp twice a day. .  pantoprazole (PROTONIX) 40 MG tablet, Take 1 tablet (40 mg total) by mouth 2 (two) times daily before a meal. .  senna (SENOKOT) 8.6 MG tablet, Take 1 tablet by mouth daily as needed.  .  tretinoin (RETIN-A) 0.025 % gel, Apply topically at bedtime. Marland Kitchen  venlafaxine XR (EFFEXOR XR) 37.5 MG 24 hr capsule, Take 1 capsule (37.5 mg total) by mouth daily with breakfast. .  Vitamin D, Ergocalciferol, (DRISDOL) 50000 units CAPS capsule, Take 1 capsule (50,000 Units total) by mouth every 3 (three)  days. (Patient taking differently: Take 50,000 Units by mouth every 7 (seven) days. ) .  polyethylene glycol (MIRALAX / GLYCOLAX) 17 g packet, Take 17 g by mouth daily.   Allergies  Allergen Reactions  . Peanut-Containing Drug Products Other (See Comments)    Patient states that her throat swells.  Rolan Lipa [Linaclotide] Rash  . Iodinated Diagnostic Agents Nausea And Vomiting    MRI dye  nausea  . Oxycontin [Oxycodone Hcl] Nausea And Vomiting  . Penicillins Hives  . Shellfish Allergy Other (See Comments)  . Sulfa Antibiotics Hives  . Topamax [Topiramate] Hives    Current Problems (verified) Patient Active Problem List   Diagnosis Date Noted  . 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 RLS (restless legs syndrome) 08/15/2018  . Chronic use of opiate drug for therapeutic purpose 08/15/2018  . Non-restorative sleep 08/15/2018  . TMJ  (temporomandibular joint syndrome) 08/07/2018  . Orthopedic hardware present 11/21/2017  . Pain in left foot 11/21/2017  . Anxiety 09/14/2017  . 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 Mixed hyperlipidemia 09/13/2014  . Abnormal glucose 09/13/2014  . Other fatigue 09/11/2014  . Vitamin D deficiency 09/11/2014  . Pain in joint, multiple sites 09/11/2014  . Medication management 09/11/2014  . GERD 08/14/2012  . Asthma, chronic 08/14/2012  . CMT (Charcot-Marie-Tooth disease) 08/14/2012  . Constipation 08/14/2012  . Esophageal dysphagia 06/25/2011    Screening Tests Immunization History  Administered Date(s) Administered  . PPD Test 08/26/2015, 09/16/2016  . Tdap 09/16/2016    Preventative care: Last colonoscopy: N/A due age 39 DUE EGD 2013 Last mammogram: 09/2017- need to get Last pap smear/pelvic exam: s/p hysterectomy Dr. Jerl Santos DEXA:N/A MRI Brain [facility] 08/2016 normal- no change in pineal cyst.  Ct AB/pelvis 04/2018  Prior vaccinations: TD or Tdap: 2018  Influenza: 2020 Pneumococcal: N/A Prevnar13: N/A shingrix: N/A COVID  Names of Other Physician/Practitioners you currently use: 1. [facility] here for primary care 2. Dr. Katy Fitch, eye doctor, last visit 2019 3. Dr. Tera Mater, dentist, last visit 2020 Patient Care Team: Unk Pinto, MD as PCP - General (Internal Medicine) Blanch Media, MD as Consulting Physician (Neurology) Renie Ora, MD as Consulting Physician (Anesthesiology) Wylene Simmer, MD as Consulting Physician (Orthopedic Surgery) Susa Day, MD as Consulting Physician (Orthopedic Surgery) Rogene Houston, MD as Consulting Physician (Gastroenterology)  SURGICAL HISTORY She  has a past surgical history that includes Esophageal manometry (05/17/2011); Tonsillectomy; tubal reconstruction (1997); Tubal ligation (1999); Dilation and curettage of uterus; Colonoscopy (N/A, 11/01/2012); Foot  neuroma surgery (Right, 06-05-2013); Tonsillectomy (as child); Hysteroscopy with D & C (N/A, 06/27/2013); laparoscopy (N/A, 06/27/2013); and Laparoscopic total hysterectomy (2018). FAMILY HISTORY Her family history includes Brain cancer in her sister; Breast cancer in her sister. SOCIAL HISTORY She  reports that she has never smoked. She has never used smokeless tobacco. She reports that she does not drink alcohol or use drugs.   She is divorced, has BF, has 2 kids  MEDICARE WELLNESS OBJECTIVES: Physical activity: Current Exercise Habits: Home exercise routine, Type of exercise: walking, Frequency (Times/Week): 4, Intensity: Mild Cardiac risk factors: Cardiac Risk Factors include: advanced age (>58men, >53 women);dyslipidemia;hypertension;sedentary lifestyle Depression/mood screen:   Depression screen [facility] 2/9 07/18/2019  Decreased Interest 0  Down, Depressed, Hopeless 0  PHQ - 2 Score 0    ADLs:  In your present state of health, do you have any difficulty performing the following activities: 07/18/2019 11/01/2018  Hearing? N N  Vision? N N  Difficulty  concentrating or making decisions? N N  Walking or climbing stairs? N N  Dressing or bathing? N N  Doing errands, shopping? N N  Some recent data might be hidden     Cognitive Testing  Alert? Yes  Normal Appearance?Yes  Oriented to person? Yes  Place? Yes   Time? Yes  Recall of three objects?  Yes  Can perform simple calculations? Yes  Displays appropriate judgment?Yes  Can read the correct time from a watch face?Yes  EOL planning: Does Patient Have a Medical Advance Directive?: No Would patient like information on creating a medical advance directive?: No - Patient declined  Review of Systems  Constitutional: Positive for malaise/fatigue. Negative for chills, diaphoresis, fever and weight loss.  HENT: Negative for congestion, ear discharge, ear pain, hearing loss, nosebleeds, sore throat and tinnitus.   Eyes: Negative.    Respiratory: Negative for cough, hemoptysis, sputum production, shortness of breath, wheezing and stridor.   Cardiovascular: Negative for chest pain, palpitations, orthopnea, claudication and leg swelling.  Gastrointestinal: Negative for abdominal pain, blood in stool, constipation, diarrhea, heartburn, melena, nausea and vomiting.  Genitourinary: Negative.   Musculoskeletal: Positive for back pain, joint pain, myalgias and neck pain. Negative for falls.  Skin: Negative.  Negative for rash.  Neurological: Positive for headaches. Negative for dizziness, tingling, tremors, sensory change, speech change, focal weakness, seizures, loss of consciousness and weakness.  Psychiatric/Behavioral: Negative.  Negative for depression and memory loss. The patient does not have insomnia.      Objective:     Today's Vitals   07/18/19 1003  BP: 124/82  Pulse: (!) 56  Temp: (!) 97.3 F (36.3 C)  SpO2: 98%  Weight: 147 lb (66.7 kg)  Height: 5\' 6"  (1.676 m)   Body mass index is 23.73 kg/m.  General appearance: alert, no distress, WD/WN, female HEENT:  normocephalic, sclerae anicteric, TMs pearly, nares patent, no discharge or erythema, pharynx normal Oral cavity: MMM, no lesions Neck: supple, no lymphadenopathy, no thyromegaly, no masses Heart: RRR, normal S1, S2, no murmurs Lungs: CTA bilaterally, no wheezes, rhonchi, or rales Abdomen: +bs, soft, non tender, non distended, no masses, no hepatomegaly, no splenomegaly Musculoskeletal: nontender, no swelling, no obvious deformity Extremities: no edema, no cyanosis, no clubbing Pulses: 2+ symmetric, upper and lower extremities, normal cap refill Neurological: alert, oriented x 3, CN2-12 intact, strength normal upper extremities and lower extremities, sensation normal throughout, DTRs 2+ throughout, no cerebellar signs, gait normal Psychiatric: normal affect, behavior normal, pleasant   Medicare Attestation I have personally reviewed: The  patient's medical and social history Their use of alcohol, tobacco or illicit drugs Their current medications and supplements The patient's functional ability including ADLs,fall risks, home safety risks, cognitive, and hearing and visual impairment Diet and physical activities Evidence for depression or mood disorders  The patient's weight, height, BMI, and visual acuity have been recorded in the chart.  I have made referrals, counseling, and provided education to the patient based on review of the above and I have provided the patient with a written personalized care plan for preventive services.     Vicie Mutters, PA-C   07/18/2019

## 2019-07-18 ENCOUNTER — Ambulatory Visit (INDEPENDENT_AMBULATORY_CARE_PROVIDER_SITE_OTHER): Payer: Medicare Other | Admitting: Physician Assistant

## 2019-07-18 ENCOUNTER — Encounter: Payer: Self-pay | Admitting: Physician Assistant

## 2019-07-18 ENCOUNTER — Other Ambulatory Visit: Payer: Self-pay

## 2019-07-18 VITALS — BP 124/82 | HR 56 | Temp 97.3°F | Ht 66.0 in | Wt 147.0 lb

## 2019-07-18 DIAGNOSIS — Z79891 Long term (current) use of opiate analgesic: Secondary | ICD-10-CM

## 2019-07-18 DIAGNOSIS — R6889 Other general symptoms and signs: Secondary | ICD-10-CM

## 2019-07-18 DIAGNOSIS — R7309 Other abnormal glucose: Secondary | ICD-10-CM | POA: Diagnosis not present

## 2019-07-18 DIAGNOSIS — H903 Sensorineural hearing loss, bilateral: Secondary | ICD-10-CM

## 2019-07-18 DIAGNOSIS — G600 Hereditary motor and sensory neuropathy: Secondary | ICD-10-CM

## 2019-07-18 DIAGNOSIS — G2581 Restless legs syndrome: Secondary | ICD-10-CM

## 2019-07-18 DIAGNOSIS — H905 Unspecified sensorineural hearing loss: Secondary | ICD-10-CM | POA: Diagnosis not present

## 2019-07-18 DIAGNOSIS — J45909 Unspecified asthma, uncomplicated: Secondary | ICD-10-CM

## 2019-07-18 DIAGNOSIS — E782 Mixed hyperlipidemia: Secondary | ICD-10-CM

## 2019-07-18 DIAGNOSIS — K5900 Constipation, unspecified: Secondary | ICD-10-CM

## 2019-07-18 DIAGNOSIS — Z0001 Encounter for general adult medical examination with abnormal findings: Secondary | ICD-10-CM | POA: Diagnosis not present

## 2019-07-18 DIAGNOSIS — Z0000 Encounter for general adult medical examination without abnormal findings: Secondary | ICD-10-CM

## 2019-07-18 DIAGNOSIS — E559 Vitamin D deficiency, unspecified: Secondary | ICD-10-CM | POA: Diagnosis not present

## 2019-07-18 DIAGNOSIS — K219 Gastro-esophageal reflux disease without esophagitis: Secondary | ICD-10-CM

## 2019-07-18 DIAGNOSIS — R1031 Right lower quadrant pain: Secondary | ICD-10-CM

## 2019-07-18 DIAGNOSIS — K5909 Other constipation: Secondary | ICD-10-CM

## 2019-07-18 DIAGNOSIS — G478 Other sleep disorders: Secondary | ICD-10-CM

## 2019-07-18 DIAGNOSIS — R55 Syncope and collapse: Secondary | ICD-10-CM

## 2019-07-18 DIAGNOSIS — M26609 Unspecified temporomandibular joint disorder, unspecified side: Secondary | ICD-10-CM

## 2019-07-18 DIAGNOSIS — F419 Anxiety disorder, unspecified: Secondary | ICD-10-CM

## 2019-07-18 DIAGNOSIS — Z79899 Other long term (current) drug therapy: Secondary | ICD-10-CM | POA: Diagnosis not present

## 2019-07-18 DIAGNOSIS — Z1211 Encounter for screening for malignant neoplasm of colon: Secondary | ICD-10-CM

## 2019-07-18 MED ORDER — POLYETHYLENE GLYCOL 3350 17 G PO PACK: 17.0000 g | PACK | Freq: Every day | ORAL | 3 refills | Status: AC

## 2019-07-18 NOTE — Patient Instructions (Addendum)
HOW TO SCHEDULE A MAMMOGRAM  [facility]  7 a.m.-6:30 p.m., Monday 7 a.m.-5 p.m., Tuesday-Friday Schedule an appointment by calling [phone].   Can add on miralax daily to see if this helps your bowels and that pain Stop the fiber Suggest following up with GI doctor for colonoscopy  Will send to pelvic floor PT for evaluation.   Know what a healthy weight is for you (roughly BMI <25) and aim to maintain this  Aim for 7+ servings of fruits and vegetables daily  65-80+ fluid ounces of water or unsweet tea for healthy kidneys  Limit to max 1 drink of alcohol per day; avoid smoking/tobacco  Limit animal fats in diet for cholesterol and heart health - choose grass fed whenever available  Avoid highly processed foods, and foods high in saturated/trans fats  Aim for low stress - take time to unwind and care for your mental health  Aim for 150 min of moderate intensity exercise weekly for heart health, and weights twice weekly for bone health  Aim for 7-9 hours of sleep daily

## 2019-07-19 LAB — COMPLETE METABOLIC PANEL WITH GFR
AG Ratio: 1.5 (calc) (ref 1.0–2.5)
ALT: 18 U/L (ref 6–29)
AST: 21 U/L (ref 10–35)
Albumin: 4.1 g/dL (ref 3.6–5.1)
Alkaline phosphatase (APISO): 75 U/L (ref 37–153)
BUN/Creatinine Ratio: 8 (calc) (ref 6–22)
BUN: 6 mg/dL — ABNORMAL LOW (ref 7–25)
CO2: 27 mmol/L (ref 20–32)
Calcium: 9.3 mg/dL (ref 8.6–10.4)
Chloride: 103 mmol/L (ref 98–110)
Creat: 0.71 mg/dL (ref 0.50–1.05)
GFR, Est African American: 115 mL/min/{1.73_m2} (ref >=60)
GFR, Est Non African American: 99 mL/min/{1.73_m2} (ref >=60)
Globulin: 2.7 g/dL (calc) (ref 1.9–3.7)
Glucose, Bld: 84 mg/dL (ref 65–99)
Potassium: 4 mmol/L (ref 3.5–5.3)
Sodium: 137 mmol/L (ref 135–146)
Total Bilirubin: 0.5 mg/dL (ref 0.2–1.2)
Total Protein: 6.8 g/dL (ref 6.1–8.1)

## 2019-07-19 LAB — TSH: TSH: 1.44 mIU/L

## 2019-07-19 LAB — CBC WITH DIFFERENTIAL/PLATELET
Absolute Monocytes: 496 cells/uL (ref 200–950)
Basophils Absolute: 17 cells/uL (ref 0–200)
Basophils Relative: 0.3 %
Eosinophils Absolute: 29 cells/uL (ref 15–500)
Eosinophils Relative: 0.5 %
HCT: 37.8 % (ref 35.0–45.0)
Hemoglobin: 12 g/dL (ref 11.7–15.5)
Lymphs Abs: 1511 cells/uL (ref 850–3900)
MCH: 26.3 pg — ABNORMAL LOW (ref 27.0–33.0)
MCHC: 31.7 g/dL — ABNORMAL LOW (ref 32.0–36.0)
MCV: 82.7 fL (ref 80.0–100.0)
MPV: 10.1 fL (ref 7.5–12.5)
Monocytes Relative: 8.7 %
Neutro Abs: 3648 cells/uL (ref 1500–7800)
Neutrophils Relative %: 64 %
Platelets: 338 10*3/uL (ref 140–400)
RBC: 4.57 10*6/uL (ref 3.80–5.10)
RDW: 13.2 % (ref 11.0–15.0)
Total Lymphocyte: 26.5 %
WBC: 5.7 10*3/uL (ref 3.8–10.8)

## 2019-07-19 LAB — LIPID PANEL
Cholesterol: 163 mg/dL (ref <200)
HDL: 62 mg/dL (ref >=50)
LDL Cholesterol (Calc): 86 mg/dL (calc)
Non-HDL Cholesterol (Calc): 101 mg/dL (calc) (ref <130)
Total CHOL/HDL Ratio: 2.6 (calc) (ref <5.0)
Triglycerides: 61 mg/dL (ref <150)

## 2019-07-19 LAB — HEMOGLOBIN A1C
Hgb A1c MFr Bld: 5.5 % of total Hgb (ref <5.7)
Mean Plasma Glucose: 111 (calc)
eAG (mmol/L): 6.2 (calc)

## 2019-07-19 LAB — VITAMIN D 25 HYDROXY (VIT D DEFICIENCY, FRACTURES): Vit D, 25-Hydroxy: 65 ng/mL (ref 30–100)

## 2019-07-19 LAB — MAGNESIUM: Magnesium: 2 mg/dL (ref 1.5–2.5)

## 2019-07-30 DIAGNOSIS — J301 Allergic rhinitis due to pollen: Secondary | ICD-10-CM | POA: Diagnosis not present

## 2019-07-30 DIAGNOSIS — J3081 Allergic rhinitis due to animal (cat) (dog) hair and dander: Secondary | ICD-10-CM | POA: Diagnosis not present

## 2019-07-30 DIAGNOSIS — J3089 Other allergic rhinitis: Secondary | ICD-10-CM | POA: Diagnosis not present

## 2019-07-31 DIAGNOSIS — M62838 Other muscle spasm: Secondary | ICD-10-CM | POA: Diagnosis not present

## 2019-07-31 DIAGNOSIS — M6281 Muscle weakness (generalized): Secondary | ICD-10-CM | POA: Diagnosis not present

## 2019-07-31 DIAGNOSIS — M545 Low back pain: Secondary | ICD-10-CM | POA: Diagnosis not present

## 2019-07-31 DIAGNOSIS — K5900 Constipation, unspecified: Secondary | ICD-10-CM | POA: Diagnosis not present

## 2019-08-02 DIAGNOSIS — J3089 Other allergic rhinitis: Secondary | ICD-10-CM | POA: Diagnosis not present

## 2019-08-02 DIAGNOSIS — J301 Allergic rhinitis due to pollen: Secondary | ICD-10-CM | POA: Diagnosis not present

## 2019-08-02 DIAGNOSIS — J3081 Allergic rhinitis due to animal (cat) (dog) hair and dander: Secondary | ICD-10-CM | POA: Diagnosis not present

## 2019-08-07 DIAGNOSIS — E348 Other specified endocrine disorders: Secondary | ICD-10-CM | POA: Diagnosis not present

## 2019-08-07 DIAGNOSIS — R519 Headache, unspecified: Secondary | ICD-10-CM | POA: Diagnosis not present

## 2019-08-08 DIAGNOSIS — J3081 Allergic rhinitis due to animal (cat) (dog) hair and dander: Secondary | ICD-10-CM | POA: Diagnosis not present

## 2019-08-08 DIAGNOSIS — J3089 Other allergic rhinitis: Secondary | ICD-10-CM | POA: Diagnosis not present

## 2019-08-08 DIAGNOSIS — Z79891 Long term (current) use of opiate analgesic: Secondary | ICD-10-CM | POA: Diagnosis not present

## 2019-08-08 DIAGNOSIS — G894 Chronic pain syndrome: Secondary | ICD-10-CM | POA: Diagnosis not present

## 2019-08-08 DIAGNOSIS — J301 Allergic rhinitis due to pollen: Secondary | ICD-10-CM | POA: Diagnosis not present

## 2019-08-08 DIAGNOSIS — G629 Polyneuropathy, unspecified: Secondary | ICD-10-CM | POA: Diagnosis not present

## 2019-08-08 DIAGNOSIS — M549 Dorsalgia, unspecified: Secondary | ICD-10-CM | POA: Diagnosis not present

## 2019-08-08 DIAGNOSIS — Z79899 Other long term (current) drug therapy: Secondary | ICD-10-CM | POA: Diagnosis not present

## 2019-08-13 ENCOUNTER — Telehealth: Payer: Self-pay | Admitting: Internal Medicine

## 2019-08-13 DIAGNOSIS — J3089 Other allergic rhinitis: Secondary | ICD-10-CM | POA: Diagnosis not present

## 2019-08-13 DIAGNOSIS — J301 Allergic rhinitis due to pollen: Secondary | ICD-10-CM | POA: Diagnosis not present

## 2019-08-13 DIAGNOSIS — J3081 Allergic rhinitis due to animal (cat) (dog) hair and dander: Secondary | ICD-10-CM | POA: Diagnosis not present

## 2019-08-13 NOTE — Telephone Encounter (Signed)
Patient contacted our office(PCP) to request referral to Dr Hildred Laser be canceled. She would like to go back to Dr Carol Ada, hx provider 2013. Patient states she has not improved under Dr Olevia Perches care and would prefer to see Dr Benson Norway. Message sent to Dr Laural Golden scheduler, " Specialist, Please contact patient for appointment cancellation, patient states she had difficulty getting through your phone line. Thank you for your assistance."

## 2019-08-15 DIAGNOSIS — J301 Allergic rhinitis due to pollen: Secondary | ICD-10-CM | POA: Diagnosis not present

## 2019-08-15 DIAGNOSIS — J3081 Allergic rhinitis due to animal (cat) (dog) hair and dander: Secondary | ICD-10-CM | POA: Diagnosis not present

## 2019-08-15 DIAGNOSIS — J3089 Other allergic rhinitis: Secondary | ICD-10-CM | POA: Diagnosis not present

## 2019-08-20 DIAGNOSIS — J301 Allergic rhinitis due to pollen: Secondary | ICD-10-CM | POA: Diagnosis not present

## 2019-08-20 DIAGNOSIS — J3081 Allergic rhinitis due to animal (cat) (dog) hair and dander: Secondary | ICD-10-CM | POA: Diagnosis not present

## 2019-08-20 DIAGNOSIS — J3089 Other allergic rhinitis: Secondary | ICD-10-CM | POA: Diagnosis not present

## 2019-08-22 DIAGNOSIS — J301 Allergic rhinitis due to pollen: Secondary | ICD-10-CM | POA: Diagnosis not present

## 2019-08-22 DIAGNOSIS — J3081 Allergic rhinitis due to animal (cat) (dog) hair and dander: Secondary | ICD-10-CM | POA: Diagnosis not present

## 2019-08-22 DIAGNOSIS — J3089 Other allergic rhinitis: Secondary | ICD-10-CM | POA: Diagnosis not present

## 2019-08-23 DIAGNOSIS — M545 Low back pain: Secondary | ICD-10-CM | POA: Diagnosis not present

## 2019-08-23 DIAGNOSIS — K5900 Constipation, unspecified: Secondary | ICD-10-CM | POA: Diagnosis not present

## 2019-08-23 DIAGNOSIS — M62838 Other muscle spasm: Secondary | ICD-10-CM | POA: Diagnosis not present

## 2019-08-23 DIAGNOSIS — M6281 Muscle weakness (generalized): Secondary | ICD-10-CM | POA: Diagnosis not present

## 2019-08-28 ENCOUNTER — Ambulatory Visit: Payer: Medicare Other

## 2019-09-03 ENCOUNTER — Ambulatory Visit: Payer: Medicare Other

## 2019-09-04 DIAGNOSIS — M545 Low back pain: Secondary | ICD-10-CM | POA: Diagnosis not present

## 2019-09-04 DIAGNOSIS — M6281 Muscle weakness (generalized): Secondary | ICD-10-CM | POA: Diagnosis not present

## 2019-09-04 DIAGNOSIS — K5900 Constipation, unspecified: Secondary | ICD-10-CM | POA: Diagnosis not present

## 2019-09-04 DIAGNOSIS — M62838 Other muscle spasm: Secondary | ICD-10-CM | POA: Diagnosis not present

## 2019-09-13 DIAGNOSIS — M545 Low back pain: Secondary | ICD-10-CM | POA: Diagnosis not present

## 2019-09-13 DIAGNOSIS — K5900 Constipation, unspecified: Secondary | ICD-10-CM | POA: Diagnosis not present

## 2019-09-13 DIAGNOSIS — M6281 Muscle weakness (generalized): Secondary | ICD-10-CM | POA: Diagnosis not present

## 2019-09-13 DIAGNOSIS — M62838 Other muscle spasm: Secondary | ICD-10-CM | POA: Diagnosis not present

## 2019-09-19 ENCOUNTER — Ambulatory Visit (INDEPENDENT_AMBULATORY_CARE_PROVIDER_SITE_OTHER): Payer: Medicare Other | Admitting: Gastroenterology

## 2019-09-20 DIAGNOSIS — M545 Low back pain: Secondary | ICD-10-CM | POA: Diagnosis not present

## 2019-09-20 DIAGNOSIS — M62838 Other muscle spasm: Secondary | ICD-10-CM | POA: Diagnosis not present

## 2019-09-20 DIAGNOSIS — M6281 Muscle weakness (generalized): Secondary | ICD-10-CM | POA: Diagnosis not present

## 2019-09-20 DIAGNOSIS — K5900 Constipation, unspecified: Secondary | ICD-10-CM | POA: Diagnosis not present

## 2019-10-03 DIAGNOSIS — Z79891 Long term (current) use of opiate analgesic: Secondary | ICD-10-CM | POA: Diagnosis not present

## 2019-10-03 DIAGNOSIS — G629 Polyneuropathy, unspecified: Secondary | ICD-10-CM | POA: Diagnosis not present

## 2019-10-03 DIAGNOSIS — G894 Chronic pain syndrome: Secondary | ICD-10-CM | POA: Diagnosis not present

## 2019-10-03 DIAGNOSIS — Z79899 Other long term (current) drug therapy: Secondary | ICD-10-CM | POA: Diagnosis not present

## 2019-10-03 DIAGNOSIS — M542 Cervicalgia: Secondary | ICD-10-CM | POA: Diagnosis not present

## 2019-10-04 DIAGNOSIS — M545 Low back pain: Secondary | ICD-10-CM | POA: Diagnosis not present

## 2019-10-04 DIAGNOSIS — M6281 Muscle weakness (generalized): Secondary | ICD-10-CM | POA: Diagnosis not present

## 2019-10-04 DIAGNOSIS — K5900 Constipation, unspecified: Secondary | ICD-10-CM | POA: Diagnosis not present

## 2019-10-04 DIAGNOSIS — M62838 Other muscle spasm: Secondary | ICD-10-CM | POA: Diagnosis not present

## 2019-10-12 DIAGNOSIS — J3081 Allergic rhinitis due to animal (cat) (dog) hair and dander: Secondary | ICD-10-CM | POA: Diagnosis not present

## 2019-10-12 DIAGNOSIS — J3089 Other allergic rhinitis: Secondary | ICD-10-CM | POA: Diagnosis not present

## 2019-10-12 DIAGNOSIS — J301 Allergic rhinitis due to pollen: Secondary | ICD-10-CM | POA: Diagnosis not present

## 2019-10-17 DIAGNOSIS — J3089 Other allergic rhinitis: Secondary | ICD-10-CM | POA: Diagnosis not present

## 2019-10-17 DIAGNOSIS — J3081 Allergic rhinitis due to animal (cat) (dog) hair and dander: Secondary | ICD-10-CM | POA: Diagnosis not present

## 2019-10-17 DIAGNOSIS — J301 Allergic rhinitis due to pollen: Secondary | ICD-10-CM | POA: Diagnosis not present

## 2019-10-19 DIAGNOSIS — M542 Cervicalgia: Secondary | ICD-10-CM | POA: Diagnosis not present

## 2019-10-19 DIAGNOSIS — Z79899 Other long term (current) drug therapy: Secondary | ICD-10-CM | POA: Diagnosis not present

## 2019-10-19 DIAGNOSIS — M549 Dorsalgia, unspecified: Secondary | ICD-10-CM | POA: Diagnosis not present

## 2019-10-19 DIAGNOSIS — G894 Chronic pain syndrome: Secondary | ICD-10-CM | POA: Diagnosis not present

## 2019-10-19 DIAGNOSIS — Z79891 Long term (current) use of opiate analgesic: Secondary | ICD-10-CM | POA: Diagnosis not present

## 2019-10-19 DIAGNOSIS — G629 Polyneuropathy, unspecified: Secondary | ICD-10-CM | POA: Diagnosis not present

## 2019-10-22 ENCOUNTER — Other Ambulatory Visit: Payer: Self-pay

## 2019-10-22 ENCOUNTER — Other Ambulatory Visit: Payer: Self-pay | Admitting: Physician Assistant

## 2019-10-22 ENCOUNTER — Ambulatory Visit
Admission: RE | Admit: 2019-10-22 | Discharge: 2019-10-22 | Disposition: A | Discharge status: Home / Self Care | Payer: Medicare Other | Source: Ambulatory Visit | Attending: Physician Assistant | Admitting: Physician Assistant

## 2019-10-22 DIAGNOSIS — M542 Cervicalgia: Secondary | ICD-10-CM

## 2019-10-22 DIAGNOSIS — M47816 Spondylosis without myelopathy or radiculopathy, lumbar region: Secondary | ICD-10-CM | POA: Diagnosis not present

## 2019-10-22 DIAGNOSIS — M5440 Lumbago with sciatica, unspecified side: Secondary | ICD-10-CM

## 2019-10-25 DIAGNOSIS — J3081 Allergic rhinitis due to animal (cat) (dog) hair and dander: Secondary | ICD-10-CM | POA: Diagnosis not present

## 2019-10-25 DIAGNOSIS — J3089 Other allergic rhinitis: Secondary | ICD-10-CM | POA: Diagnosis not present

## 2019-10-25 DIAGNOSIS — J301 Allergic rhinitis due to pollen: Secondary | ICD-10-CM | POA: Diagnosis not present

## 2019-10-31 DIAGNOSIS — J3089 Other allergic rhinitis: Secondary | ICD-10-CM | POA: Diagnosis not present

## 2019-10-31 DIAGNOSIS — J3081 Allergic rhinitis due to animal (cat) (dog) hair and dander: Secondary | ICD-10-CM | POA: Diagnosis not present

## 2019-10-31 DIAGNOSIS — J301 Allergic rhinitis due to pollen: Secondary | ICD-10-CM | POA: Diagnosis not present

## 2019-11-06 DIAGNOSIS — J3089 Other allergic rhinitis: Secondary | ICD-10-CM | POA: Diagnosis not present

## 2019-11-06 DIAGNOSIS — J301 Allergic rhinitis due to pollen: Secondary | ICD-10-CM | POA: Diagnosis not present

## 2019-11-06 DIAGNOSIS — J3081 Allergic rhinitis due to animal (cat) (dog) hair and dander: Secondary | ICD-10-CM | POA: Diagnosis not present

## 2019-11-15 DIAGNOSIS — J3089 Other allergic rhinitis: Secondary | ICD-10-CM | POA: Diagnosis not present

## 2019-11-15 DIAGNOSIS — J3081 Allergic rhinitis due to animal (cat) (dog) hair and dander: Secondary | ICD-10-CM | POA: Diagnosis not present

## 2019-11-15 DIAGNOSIS — J301 Allergic rhinitis due to pollen: Secondary | ICD-10-CM | POA: Diagnosis not present

## 2019-11-19 DIAGNOSIS — G629 Polyneuropathy, unspecified: Secondary | ICD-10-CM | POA: Diagnosis not present

## 2019-11-19 DIAGNOSIS — M549 Dorsalgia, unspecified: Secondary | ICD-10-CM | POA: Diagnosis not present

## 2019-11-19 DIAGNOSIS — J301 Allergic rhinitis due to pollen: Secondary | ICD-10-CM | POA: Diagnosis not present

## 2019-11-19 DIAGNOSIS — Z79891 Long term (current) use of opiate analgesic: Secondary | ICD-10-CM | POA: Diagnosis not present

## 2019-11-19 DIAGNOSIS — Z79899 Other long term (current) drug therapy: Secondary | ICD-10-CM | POA: Diagnosis not present

## 2019-11-19 DIAGNOSIS — J3089 Other allergic rhinitis: Secondary | ICD-10-CM | POA: Diagnosis not present

## 2019-11-19 DIAGNOSIS — M542 Cervicalgia: Secondary | ICD-10-CM | POA: Diagnosis not present

## 2019-11-19 DIAGNOSIS — G894 Chronic pain syndrome: Secondary | ICD-10-CM | POA: Diagnosis not present

## 2019-11-19 DIAGNOSIS — J3081 Allergic rhinitis due to animal (cat) (dog) hair and dander: Secondary | ICD-10-CM | POA: Diagnosis not present

## 2019-11-20 ENCOUNTER — Telehealth: Payer: Self-pay

## 2019-11-20 NOTE — Telephone Encounter (Signed)
Going out of town Friday. Will be on a boat. Requesting a prescription for motion sickness patches. Please advise.

## 2019-11-21 ENCOUNTER — Other Ambulatory Visit: Payer: Self-pay | Admitting: Adult Health Nurse Practitioner

## 2019-11-21 DIAGNOSIS — T753XXD Motion sickness, subsequent encounter: Secondary | ICD-10-CM

## 2019-11-21 MED ORDER — SCOPOLAMINE 1 MG/3DAYS TD PT72: 1.0000 | MEDICATED_PATCH | TRANSDERMAL | 0 refills | Status: DC

## 2019-11-22 ENCOUNTER — Other Ambulatory Visit: Payer: Self-pay

## 2019-11-22 DIAGNOSIS — M79641 Pain in right hand: Secondary | ICD-10-CM | POA: Diagnosis not present

## 2019-11-22 DIAGNOSIS — R519 Headache, unspecified: Secondary | ICD-10-CM

## 2019-11-22 DIAGNOSIS — M542 Cervicalgia: Secondary | ICD-10-CM

## 2019-11-22 DIAGNOSIS — G5603 Carpal tunnel syndrome, bilateral upper limbs: Secondary | ICD-10-CM

## 2019-11-22 DIAGNOSIS — M2550 Pain in unspecified joint: Secondary | ICD-10-CM

## 2019-11-22 DIAGNOSIS — M79642 Pain in left hand: Secondary | ICD-10-CM | POA: Diagnosis not present

## 2019-11-22 HISTORY — DX: Carpal tunnel syndrome, bilateral upper limbs: G56.03

## 2019-11-22 NOTE — Telephone Encounter (Signed)
Refill request for Robaxin. In que for your review.

## 2019-11-23 ENCOUNTER — Other Ambulatory Visit: Payer: Self-pay | Admitting: Physician Assistant

## 2019-11-23 MED ORDER — METHOCARBAMOL 500 MG PO TABS: 500.0000 mg | ORAL_TABLET | Freq: Three times a day (TID) | ORAL | 1 refills | Status: DC | PRN

## 2019-11-29 DIAGNOSIS — J301 Allergic rhinitis due to pollen: Secondary | ICD-10-CM | POA: Diagnosis not present

## 2019-11-29 DIAGNOSIS — J3081 Allergic rhinitis due to animal (cat) (dog) hair and dander: Secondary | ICD-10-CM | POA: Diagnosis not present

## 2019-11-29 DIAGNOSIS — J3089 Other allergic rhinitis: Secondary | ICD-10-CM | POA: Diagnosis not present

## 2019-11-29 DIAGNOSIS — Z20822 Contact with and (suspected) exposure to covid-19: Secondary | ICD-10-CM | POA: Diagnosis not present

## 2019-12-10 DIAGNOSIS — G5622 Lesion of ulnar nerve, left upper limb: Secondary | ICD-10-CM | POA: Diagnosis not present

## 2019-12-10 DIAGNOSIS — G5603 Carpal tunnel syndrome, bilateral upper limbs: Secondary | ICD-10-CM | POA: Diagnosis not present

## 2019-12-10 DIAGNOSIS — G5602 Carpal tunnel syndrome, left upper limb: Secondary | ICD-10-CM | POA: Diagnosis not present

## 2019-12-10 DIAGNOSIS — G5601 Carpal tunnel syndrome, right upper limb: Secondary | ICD-10-CM | POA: Diagnosis not present

## 2019-12-12 ENCOUNTER — Ambulatory Visit (INDEPENDENT_AMBULATORY_CARE_PROVIDER_SITE_OTHER): Payer: Medicare Other | Admitting: Internal Medicine

## 2019-12-12 ENCOUNTER — Other Ambulatory Visit: Payer: Self-pay

## 2019-12-12 VITALS — BP 120/84 | HR 64 | Temp 97.0°F | Resp 16 | Ht 65.0 in | Wt 154.0 lb

## 2019-12-12 DIAGNOSIS — R7309 Other abnormal glucose: Secondary | ICD-10-CM | POA: Diagnosis not present

## 2019-12-12 DIAGNOSIS — E559 Vitamin D deficiency, unspecified: Secondary | ICD-10-CM

## 2019-12-12 DIAGNOSIS — Z0001 Encounter for general adult medical examination with abnormal findings: Secondary | ICD-10-CM

## 2019-12-12 DIAGNOSIS — Z136 Encounter for screening for cardiovascular disorders: Secondary | ICD-10-CM | POA: Diagnosis not present

## 2019-12-12 DIAGNOSIS — R0989 Other specified symptoms and signs involving the circulatory and respiratory systems: Secondary | ICD-10-CM

## 2019-12-12 DIAGNOSIS — R7303 Prediabetes: Secondary | ICD-10-CM

## 2019-12-12 DIAGNOSIS — K219 Gastro-esophageal reflux disease without esophagitis: Secondary | ICD-10-CM

## 2019-12-12 DIAGNOSIS — E782 Mixed hyperlipidemia: Secondary | ICD-10-CM

## 2019-12-12 DIAGNOSIS — Z0000 Encounter for general adult medical examination without abnormal findings: Secondary | ICD-10-CM

## 2019-12-12 DIAGNOSIS — G894 Chronic pain syndrome: Secondary | ICD-10-CM

## 2019-12-12 DIAGNOSIS — Z79899 Other long term (current) drug therapy: Secondary | ICD-10-CM

## 2019-12-12 DIAGNOSIS — Z8249 Family history of ischemic heart disease and other diseases of the circulatory system: Secondary | ICD-10-CM

## 2019-12-12 DIAGNOSIS — Z1211 Encounter for screening for malignant neoplasm of colon: Secondary | ICD-10-CM

## 2019-12-12 DIAGNOSIS — G600 Hereditary motor and sensory neuropathy: Secondary | ICD-10-CM

## 2019-12-12 NOTE — Patient Instructions (Signed)

## 2019-12-12 NOTE — Progress Notes (Signed)
Annual Screening/Preventative Visit & Comprehensive Evaluation &  Examination      This very nice 49 y.o. DBF presents for a Screening /Preventative Visit & comprehensive evaluation and management of multiple medical co-morbidities.  Patient has been followed for HTN, HLD, Prediabetes  and Vitamin D Deficiency.  Patient had a normal sleep study with Dr. Brett Fairy in June 2020. Patient's GERD is controlled on her meds.                                           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        Patient has been followed expectantly for hx/o labile HTN. Patient's BP has been controlled at home and patient had a  had normal stress test ion Mar 2021.  She denies any cardiac symptoms as chest pain, palpitations, shortness of breath, dizziness or ankle swelling. Today's BP is at goal - 120/84.      Patient's hyperlipidemia is controlled with diet and medications. Patient denies myalgias or other medication SE's. Last lipids were at goal:  Lab Results  Component Value Date   CHOL 163 07/18/2019   HDL 62 07/18/2019   LDLCALC 86 07/18/2019   TRIG 61 07/18/2019   CHOLHDL 2.6 07/18/2019       Patient has hx/o prediabetes(A1c 5.7% /2016)and patient denies reactive hypoglycemic symptoms, visual blurring, diabetic polys or paresthesias. Last A1c was Normal & at goal:  Lab Results  Component Value Date   HGBA1C 5.5 07/18/2019       Finally, patient has history of Vitamin D Deficiency ("25" /2016) and last Vitamin D was at goal:  Lab Results  Component Value Date   VD25OH 65 07/18/2019    Current Outpatient Medications on File Prior to Visit  Medication Sig  . albuterol (PROAIR HFA) 108 (90 Base) MCG/ACT inhaler Inhale 2 puffs into the lungs every 6 (six) hours as needed for wheezing or shortness of breath.  Marland Kitchen aspirin EC  81 MG tablet Take 81 mg by mouth daily.  Marland Kitchen azelastine (OPTIVAR) 0.05 % ophthalmic solution   . BREO ELLIPTA 200-25 MCG/INH AEPB Inhale 1 puff into the lungs daily.  . cetirizine (ZYRTEC) 10 MG tablet Take 10 mg by mouth every evening.  Marland Kitchen EPINEPHrine 0.3 mg/0.3 mL IJ SOAJ injection Inject 0.3 mg into the muscle as needed.   . fluticasone (CUTIVATE) 0.05 % cream Apply 1 application topically 2 (two) times daily.   . fluticasone (FLONASE) 50 MCG/ACT nasal spray   . Fluticasone-Salmeterol (ADVAIR DISKUS) 250-50 MCG/DOSE AEPB Inhale 1 puff into the lungs every 12 (twelve) hours.  . gabapentin (NEURONTIN) 800 MG tablet Take 1 tablet 4 x /day with Meals & Bedtime for Neuropathic Pain  . HYDROcodone-acetaminophen (NORCO) 7.5-325 MG tablet Take 1 tablet by mouth. Takes 1 tablet 3 times a day.  . methocarbamol (ROBAXIN) 500 MG tablet Take 1 tablet (500 mg total) by mouth 3 (three) times daily as needed for muscle spasms.  . montelukast (SINGULAIR) 10 MG tablet Take 1 tablet (10 mg total) by mouth at bedtime.  . Olopatadine HCl (PATADAY) 0.2 % SOLN Apply to each eye as needed  . OVER THE COUNTER MEDICATION Fiber laxative 2 tsp  twice a day.  . pantoprazole (PROTONIX) 40 MG tablet Take 1 tablet (40 mg total) by mouth 2 (two) times daily before a meal.  . polyethylene glycol (MIRALAX / GLYCOLAX) 17 g packet Take 17 g by mouth daily.  Marland Kitchen scopolamine (TRANSDERM-SCOP, 1.5 MG,) 1 MG/3DAYS Place 1 patch (1.5 mg total) onto the skin every 3 (three) days.  Marland Kitchen senna (SENOKOT) 8.6 MG tablet Take 1 tablet by mouth daily as needed.   . tretinoin (RETIN-A) 0.025 % gel Apply topically at bedtime.  Marland Kitchen venlafaxine XR (EFFEXOR XR) 37.5 MG 24 hr capsule Take 1 capsule (37.5 mg total) by mouth daily with breakfast.  . Vitamin D, Ergocalciferol, (DRISDOL) 50000 units CAPS capsule Take 1 capsule (50,000 Units total) by mouth every 3 (three) days. (Patient taking differently: Take 50,000 Units by mouth every 7 (seven) days. )     Allergies  Allergen Reactions  . Peanut-Containing Drug Products Other (See Comments)    Patient states that her throat swells.  Rolan Lipa [Linaclotide] Rash  . Iodinated Diagnostic Agents Nausea And Vomiting    MRI dye  nausea  . Oxycontin [Oxycodone Hcl] Nausea And Vomiting  . Penicillins Hives  . Shellfish Allergy Other (See Comments)  . Sulfa Antibiotics Hives  . Topamax [Topiramate] Hives   Past Medical History:  Diagnosis Date  . Allergy   . Asthma   . Chronic constipation   . CMT (Charcot-Marie-Tooth disease)   . GERD (gastroesophageal reflux disease)   . 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 Seasonal allergies   . Syncope 06/17/2017   Saw Dr. Geraldo Pitter in cardiology, had normal stress test 06/20/2019.   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 Uterine polyp   . Wears glasses    Health Maintenance  Topic Date Due  . HIV Screening  Never done  . PAP SMEAR-Modifier  09/13/2016  . MAMMOGRAM  10/13/2018  . INFLUENZA VACCINE  Never done  . COLONOSCOPY  11/02/2022  . TETANUS/TDAP  09/17/2026  . Hepatitis C Screening  Completed   Immunization History  Administered Date(s) Administered  . PPD Test 08/26/2015, 09/16/2016  . Tdap 09/16/2016    Last Colon -  11/01/2012 - Dr Laural Golden - [facility]   Last MGM - 10/11/2017 -0 Dr Tomasa Hosteller [facility]) - recc 10 year f/u due 2029  Past Surgical History:  Procedure Laterality Date  . COLONOSCOPY N/A 11/01/2012   Procedure: COLONOSCOPY;  Surgeon: Rogene Houston, MD;  [facility];  Service: Endoscopy;  Laterality: N/A;  130  . DILATION AND CURETTAGE OF UTERUS    . ESOPHAGEAL MANOMETRY  05/17/2011   Procedure: ESOPHAGEAL MANOMETRY (EM);  Surgeon: Beryle Beams, MD;  Location: [facility];  Service: Endoscopy;  Laterality: N/A;  . FOOT NEUROMA SURGERY Right 06-05-2013  . HYSTEROSCOPY WITH D & C N/A 06/27/2013   Procedure: HYSTEROSCOPY  POLPYECTOMY  ;  Surgeon: Governor Specking, MD;  Location: [facility];  Service: Gynecology;  Laterality: N/A;  . LAPAROSCOPIC TOTAL HYSTERECTOMY  2018   [facility]  . LAPAROSCOPY N/A 06/27/2013   Procedure: LAPAROSCOPY WITH extensive lysis of adhesions;  Surgeon: Governor Specking, MD;  Location: [facility];  Service: Gynecology;  Laterality: N/A;  . TONSILLECTOMY    . TONSILLECTOMY  as child  . TUBAL LIGATION  1999  . tubal reconstruction  1997   Family History  Problem Relation Age of Onset  .  Breast cancer Sister   . Brain cancer Sister    Social History   Tobacco Use  . Smoking status: Never Smoker  . Smokeless tobacco: Never Used  Vaping Use  . Vaping Use: Never used  Substance Use Topics  . Alcohol use: No  . Drug use: No    ROS Constitutional: Denies fever, chills, weight loss/gain, headaches, insomnia,  night sweats, and change in appetite. Does c/o fatigue. Eyes: Denies redness, blurred vision, diplopia, discharge, itchy, watery eyes.  ENT: Denies discharge, congestion, post nasal drip, epistaxis, sore throat, earache, hearing loss, dental pain, Tinnitus, Vertigo, Sinus pain, snoring.  Cardio: Denies chest pain, palpitations, irregular heartbeat, syncope, dyspnea, diaphoresis, orthopnea, PND, claudication, edema Respiratory: denies cough, dyspnea, DOE, pleurisy, hoarseness, laryngitis, wheezing.  Gastrointestinal: Denies dysphagia, heartburn, reflux, water brash, pain, cramps, nausea, vomiting, bloating, diarrhea, constipation, hematemesis, melena, hematochezia, jaundice, hemorrhoids Genitourinary: Denies dysuria, frequency, urgency, nocturia, hesitancy, discharge, hematuria, flank pain Breast: Breast lumps, nipple discharge, bleeding.  Musculoskeletal: Denies arthralgia, myalgia, stiffness, Jt. Swelling, pain, limp, and strain/sprain. Denies falls. Skin: Denies puritis, rash, hives, warts, acne, eczema, changing in skin lesion Neuro: No weakness,  tremor, incoordination, spasms, bur has paresthesia & pain of the distal LE's. Psychiatric: Denies confusion, memory loss, sensory loss. Denies Depression. Endocrine: Denies change in weight, skin, hair change, nocturia, and paresthesia, diabetic polys, visual blurring, hyper / hypo glycemic episodes.  Heme/Lymph: No excessive bleeding, bruising, enlarged lymph nodes.  Physical Exam  BP 120/84   Pulse 64   Temp (!) 97 F (36.1 C)   Resp 16   Ht 5\' 5"  (1.651 m)   Wt 154 lb (69.9 kg)   LMP 02/13/2016   BMI 25.63 kg/m   General Appearance: Well nourished, well groomed and in no apparent distress.  Eyes: PERRLA, EOMs, conjunctiva no swelling or erythema, normal fundi and vessels. Sinuses: No frontal/maxillary tenderness ENT/Mouth: EACs patent / TMs  nl. Nares clear without erythema, swelling, mucoid exudates. Oral hygiene is good. No erythema, swelling, or exudate. Tongue normal, non-obstructing. Tonsils not swollen or erythematous. Hearing normal.  Neck: Supple, thyroid not palpable. No bruits, nodes or JVD. Respiratory: Respiratory effort normal.  BS equal and clear bilateral without rales, rhonci, wheezing or stridor. Cardio: Heart sounds are normal with regular rate and rhythm and no murmurs, rubs or gallops. Peripheral pulses are normal and equal bilaterally without edema. No aortic or femoral bruits. Chest: symmetric with normal excursions and percussion. Breasts: Symmetric, without lumps, nipple discharge, retractions, or fibrocystic changes.  Abdomen: Flat, soft with bowel sounds active. Nontender, no guarding, rebound, hernias, masses, or organomegaly.  Lymphatics: Non tender without lymphadenopathy.  Genitourinary:  Musculoskeletal: Full ROM all peripheral extremities, joint stability, 5/5 strength, and normal gait. Skin: Warm and dry without rashes, lesions, cyanosis, clubbing or  ecchymosis.  Neuro: Cranial nerves intact, reflexes equal bilaterally. Normal muscle tone, no  cerebellar symptoms. Sensation intact.  Pysch: Alert and oriented X 3, normal affect, Insight and Judgment appropriate.   Assessment and Plan  1. Annual Preventative Screening Examination   2. Labile hypertension  - EKG 12-Lead - Urinalysis, Routine w reflex microscopic - Microalbumin / creatinine urine ratio - CBC with Differential/Platelet - COMPLETE METABOLIC PANEL WITH GFR - Magnesium - TSH  3. Hyperlipidemia, mixed  - EKG 12-Lead - Lipid panel - TSH  4. Abnormal glucose  - EKG 12-Lead - Hemoglobin A1c - Insulin, random  5. Vitamin D deficiency  - VITAMIN D 25 Hydroxy  6. Prediabetes  - EKG 12-Lead -  Hemoglobin A1c - Insulin, random  7. Chronic pain syndrome   8. Gastroesophageal reflux disease   - CBC with Differential/Platelet  9. CMT (Charcot-Marie-Tooth disease)  - LOW EXTREMITY NEUR EXAM DOCUM  10. Colon cancer screening  - POC Hemoccult Bld/Stl  11. Screening for ischemic heart disease  - EKG 12-Lead  12. FHx: heart disease  - EKG 12-Lead  13. Medication management  - Urinalysis, Routine w reflex microscopic - Microalbumin / creatinine urine ratio - CBC with Differential/Platelet - COMPLETE METABOLIC PANEL WITH GFR - Magnesium - Lipid panel - TSH - Hemoglobin A1c - Insulin, random - VITAMIN D 25 Hydroxy         Patient was counseled in prudent diet to achieve/maintain BMI less than 25 for weight control, BP monitoring, regular exercise and medications. Discussed med's effects and SE's. Screening labs and tests as requested with regular follow-up as recommended. Over 40 minutes of exam, counseling, chart review and high complex critical decision making was performed.   Kirtland Bouchard, MD

## 2019-12-13 DIAGNOSIS — J3089 Other allergic rhinitis: Secondary | ICD-10-CM | POA: Diagnosis not present

## 2019-12-13 DIAGNOSIS — J3081 Allergic rhinitis due to animal (cat) (dog) hair and dander: Secondary | ICD-10-CM | POA: Diagnosis not present

## 2019-12-13 DIAGNOSIS — J301 Allergic rhinitis due to pollen: Secondary | ICD-10-CM | POA: Diagnosis not present

## 2019-12-13 LAB — CBC WITH DIFFERENTIAL/PLATELET
Absolute Monocytes: 409 cells/uL (ref 200–950)
Basophils Absolute: 19 cells/uL (ref 0–200)
Basophils Relative: 0.4 %
Eosinophils Absolute: 52 cells/uL (ref 15–500)
Eosinophils Relative: 1.1 %
HCT: 39.5 % (ref 35.0–45.0)
Hemoglobin: 12.6 g/dL (ref 11.7–15.5)
Lymphs Abs: 1814 cells/uL (ref 850–3900)
MCH: 26 pg — ABNORMAL LOW (ref 27.0–33.0)
MCHC: 31.9 g/dL — ABNORMAL LOW (ref 32.0–36.0)
MCV: 81.4 fL (ref 80.0–100.0)
MPV: 10 fL (ref 7.5–12.5)
Monocytes Relative: 8.7 %
Neutro Abs: 2406 cells/uL (ref 1500–7800)
Neutrophils Relative %: 51.2 %
Platelets: 348 10*3/uL (ref 140–400)
RBC: 4.85 10*6/uL (ref 3.80–5.10)
RDW: 13.1 % (ref 11.0–15.0)
Total Lymphocyte: 38.6 %
WBC: 4.7 10*3/uL (ref 3.8–10.8)

## 2019-12-13 LAB — COMPLETE METABOLIC PANEL WITH GFR
AG Ratio: 1.6 (calc) (ref 1.0–2.5)
ALT: 15 U/L (ref 6–29)
AST: 19 U/L (ref 10–35)
Albumin: 4.3 g/dL (ref 3.6–5.1)
Alkaline phosphatase (APISO): 71 U/L (ref 37–153)
BUN: 7 mg/dL (ref 7–25)
CO2: 28 mmol/L (ref 20–32)
Calcium: 9.1 mg/dL (ref 8.6–10.4)
Chloride: 104 mmol/L (ref 98–110)
Creat: 0.75 mg/dL (ref 0.50–1.05)
GFR, Est African American: 108 mL/min/{1.73_m2} (ref >=60)
GFR, Est Non African American: 93 mL/min/{1.73_m2} (ref >=60)
Globulin: 2.7 g/dL (calc) (ref 1.9–3.7)
Glucose, Bld: 84 mg/dL (ref 65–99)
Potassium: 4.2 mmol/L (ref 3.5–5.3)
Sodium: 138 mmol/L (ref 135–146)
Total Bilirubin: 0.4 mg/dL (ref 0.2–1.2)
Total Protein: 7 g/dL (ref 6.1–8.1)

## 2019-12-13 LAB — URINALYSIS, ROUTINE W REFLEX MICROSCOPIC
Bilirubin Urine: NEGATIVE
Glucose, UA: NEGATIVE
Hgb urine dipstick: NEGATIVE
Ketones, ur: NEGATIVE
Leukocytes,Ua: NEGATIVE
Nitrite: NEGATIVE
Protein, ur: NEGATIVE
Specific Gravity, Urine: 1.021 (ref 1.001–1.03)
pH: <=5 (ref 5.0–8.0)

## 2019-12-13 LAB — MICROALBUMIN / CREATININE URINE RATIO
Creatinine, Urine: 238 mg/dL (ref 20–275)
Microalb Creat Ratio: 4 mcg/mg creat (ref <30)
Microalb, Ur: 0.9 mg/dL

## 2019-12-13 LAB — LIPID PANEL
Cholesterol: 195 mg/dL (ref <200)
HDL: 61 mg/dL (ref >=50)
LDL Cholesterol (Calc): 117 mg/dL (calc) — ABNORMAL HIGH
Non-HDL Cholesterol (Calc): 134 mg/dL (calc) — ABNORMAL HIGH (ref <130)
Total CHOL/HDL Ratio: 3.2 (calc) (ref <5.0)
Triglycerides: 80 mg/dL (ref <150)

## 2019-12-13 LAB — VITAMIN D 25 HYDROXY (VIT D DEFICIENCY, FRACTURES): Vit D, 25-Hydroxy: 40 ng/mL (ref 30–100)

## 2019-12-13 LAB — INSULIN, RANDOM: Insulin: 5.4 u[IU]/mL

## 2019-12-13 LAB — HEMOGLOBIN A1C
Hgb A1c MFr Bld: 5.5 % of total Hgb (ref <5.7)
Mean Plasma Glucose: 111 (calc)
eAG (mmol/L): 6.2 (calc)

## 2019-12-13 LAB — MAGNESIUM: Magnesium: 2.1 mg/dL (ref 1.5–2.5)

## 2019-12-13 LAB — TSH: TSH: 1.15 mIU/L

## 2019-12-13 NOTE — Progress Notes (Signed)
========================================================== -   Test results slightly outside the reference range are not unusual. If there is anything important, I will review this with you,  otherwise it is considered normal test values.  If you have further questions,  please do not hesitate to contact me at the office or via My Chart.  ==========================================================  -  Total Chol = 195 - elevated  (Ideal or Goal is less than 180)  - and   - Bad / Dangerous LDL Chol = 117 - also too high  (Ideal or goal is less than 70)  - So........................  - Recommend a stricter low cholesterol diet   - Cholesterol only comes from animal sources  - ie. meat, dairy, egg yolks  - Eat all the vegetables you want.  - Avoid meat, especially red meat - Beef AND Pork .  - Avoid cheese & dairy - milk & ice cream.     - Cheese is the most concentrated form of trans-fats which  is the worst thing to clog up our arteries.   - Veggie cheese is OK which can be found in the fresh  produce section at [employer] or [employer] or [employer] ==========================================================  -  A1c still back in Normal nonDiabetic range - Great  ! ==========================================================  -  Vitamin D = 40 - Low   - Vitamin D goal is between 70-100.   - Please take your Vitamin D 5,000 units EVERY day  !  - It is very important as a natural anti-inflammatory and helping the  immune system protect against viral infections, like the Covid-19    helping hair, skin, and nails, as well as reducing stroke and heart attack risk.   - It helps your bones and helps with mood.  - It also decreases numerous cancer risks so please take it as directed.   - Low Vit D is associated with a 200-300% higher risk for CANCER   and 200-300% higher risk for HEART   ATTACK  &  STROKE.    - It is also associated with higher death rate at younger  ages,   autoimmune diseases like Rheumatoid arthritis, Lupus,  Multiple Sclerosis.     - Also many other serious conditions, like depression, Alzheimer's  Dementia, infertility, muscle aches, fatigue, fibromyalgia - just to name a few.  ==========================================================  - All Else - CBC - Kidneys - Electrolytes - Liver - Magnesium & Thyroid    - all  Normal / OK ==========================================================

## 2019-12-19 DIAGNOSIS — J3081 Allergic rhinitis due to animal (cat) (dog) hair and dander: Secondary | ICD-10-CM | POA: Diagnosis not present

## 2019-12-19 DIAGNOSIS — J301 Allergic rhinitis due to pollen: Secondary | ICD-10-CM | POA: Diagnosis not present

## 2019-12-19 DIAGNOSIS — J3089 Other allergic rhinitis: Secondary | ICD-10-CM | POA: Diagnosis not present

## 2019-12-25 DIAGNOSIS — J301 Allergic rhinitis due to pollen: Secondary | ICD-10-CM | POA: Diagnosis not present

## 2019-12-25 DIAGNOSIS — J3081 Allergic rhinitis due to animal (cat) (dog) hair and dander: Secondary | ICD-10-CM | POA: Diagnosis not present

## 2019-12-25 DIAGNOSIS — G5603 Carpal tunnel syndrome, bilateral upper limbs: Secondary | ICD-10-CM | POA: Diagnosis not present

## 2019-12-25 DIAGNOSIS — J3089 Other allergic rhinitis: Secondary | ICD-10-CM | POA: Diagnosis not present

## 2019-12-31 ENCOUNTER — Encounter: Payer: Self-pay | Admitting: Internal Medicine

## 2019-12-31 DIAGNOSIS — J3089 Other allergic rhinitis: Secondary | ICD-10-CM | POA: Diagnosis not present

## 2019-12-31 DIAGNOSIS — J3081 Allergic rhinitis due to animal (cat) (dog) hair and dander: Secondary | ICD-10-CM | POA: Diagnosis not present

## 2019-12-31 DIAGNOSIS — J301 Allergic rhinitis due to pollen: Secondary | ICD-10-CM | POA: Diagnosis not present

## 2020-01-01 DIAGNOSIS — J4520 Mild intermittent asthma, uncomplicated: Secondary | ICD-10-CM | POA: Diagnosis not present

## 2020-01-08 DIAGNOSIS — J3089 Other allergic rhinitis: Secondary | ICD-10-CM | POA: Diagnosis not present

## 2020-01-08 DIAGNOSIS — J301 Allergic rhinitis due to pollen: Secondary | ICD-10-CM | POA: Diagnosis not present

## 2020-01-08 DIAGNOSIS — J3081 Allergic rhinitis due to animal (cat) (dog) hair and dander: Secondary | ICD-10-CM | POA: Diagnosis not present

## 2020-01-16 DIAGNOSIS — J301 Allergic rhinitis due to pollen: Secondary | ICD-10-CM | POA: Diagnosis not present

## 2020-01-16 DIAGNOSIS — M47812 Spondylosis without myelopathy or radiculopathy, cervical region: Secondary | ICD-10-CM | POA: Diagnosis not present

## 2020-01-16 DIAGNOSIS — G5601 Carpal tunnel syndrome, right upper limb: Secondary | ICD-10-CM | POA: Diagnosis not present

## 2020-01-16 DIAGNOSIS — J3081 Allergic rhinitis due to animal (cat) (dog) hair and dander: Secondary | ICD-10-CM | POA: Diagnosis not present

## 2020-01-16 DIAGNOSIS — J3089 Other allergic rhinitis: Secondary | ICD-10-CM | POA: Diagnosis not present

## 2020-01-16 DIAGNOSIS — G894 Chronic pain syndrome: Secondary | ICD-10-CM | POA: Diagnosis not present

## 2020-01-21 DIAGNOSIS — G5621 Lesion of ulnar nerve, right upper limb: Secondary | ICD-10-CM | POA: Diagnosis not present

## 2020-01-21 DIAGNOSIS — G5601 Carpal tunnel syndrome, right upper limb: Secondary | ICD-10-CM | POA: Insufficient documentation

## 2020-01-21 HISTORY — DX: Carpal tunnel syndrome, right upper limb: G56.01

## 2020-01-24 ENCOUNTER — Telehealth: Payer: Self-pay

## 2020-01-24 NOTE — Telephone Encounter (Signed)
Needs a new GI doctor, requesting a referral to [facility] to see Vicie Mutters.

## 2020-01-28 NOTE — Telephone Encounter (Signed)
Left message on voice mail  to call back

## 2020-01-29 ENCOUNTER — Ambulatory Visit: Payer: Medicare Other | Admitting: Adult Health Nurse Practitioner

## 2020-02-01 NOTE — Progress Notes (Deleted)
Assessment and Plan:  There are no diagnoses linked to this encounter.    Further disposition pending results of labs. Discussed med's effects and SE's.   Over 30 minutes of exam, counseling, chart review, and critical decision making was performed.   Future Appointments  Date Time Provider [place]  02/04/2020 11:30 AM Liane Comber, NP [facility] None  06/12/2020  2:30 PM Liane Comber, NP [facility] None  12/17/2020  3:00 PM Unk Pinto, MD [facility] None    ------------------------------------------------------------------------------------------------------------------   HPI LMP 02/13/2016   49 y.o.female with hx of charcot marie tooth, GERD, esophageal dysphagia, chronic constipation presents for evaluation of persistent abdominal pain ***     Had CT abd/pelvis 04/2018  CLINICAL DATA:  Abdominal pain, primarily right-sided  EXAM: CT ABDOMEN AND PELVIS WITHOUT CONTRAST  FINDINGS: Lower chest: Lung bases are clear.  Hepatobiliary: No focal liver lesions are appreciable on this noncontrast enhanced study. Gallbladder wall is not appreciably thickened. There is no biliary duct dilatation.  Pancreas: No pancreatic mass or inflammatory focus.  Spleen: No splenic lesions are evident.  Adrenals/Urinary Tract: Adrenals bilaterally appear normal. Kidneys bilat show no evident mass or hydronephrosis on either side. There is no evident renal or ureteral calculus on either side. Urinary bladder is midline with wall thickness within normal limits.  Stomach/Bowel: There is moderate stool throughout the colon. There is no appreciable bowel wall or mesenteric thickening. There is no appreciable bowel obstruction. There is no free air or portal venous Air.  Vascular/Lymphatic: No abdominal aortic aneurysm. No vascular lesions are appreciable on this noncontrast enhanced study. There is no adenopathy in the abdomen or pelvis.  Reproductive: Uterus  is absent.  No evident pelvic mass.  Other: The appendix appears normal. There is no abscess or ascites in the abdomen or pelvis.  Musculoskeletal: There are no blastic or lytic bone lesions. No intramuscular or abdominal wall lesions.  IMPRESSION: 1. A cause for patient's symptoms has not been established with this study. 2. Appendix appears normal. No bowel obstruction. No abscess in the abdomen or pelvis. 3.  No evident renal or ureteral calculus.  No hydronephrosis. 4.  Uterus absent.   Pelvic US 07/2015  Showed IMPRESSION: 1. Mild enlarged uterus. There is a dominant myometrial fibroid in right posterior uterine body measures 5.2 x 4.4 cm. There is heterogeneous thickening of endometrium measures 21.3 mm thickness. Correlation with GYN exam and further evaluation or sampling is recommended to exclude endometrial polyp or endometrial hyperplasia.  Past Medical History:  Diagnosis Date  . Allergy   . Asthma   . Chronic constipation   . CMT (Charcot-Marie-Tooth disease)   . GERD (gastroesophageal reflux disease)   . 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 Seasonal allergies   . Syncope 06/17/2017   Saw Dr. Geraldo Pitter in cardiology, had normal stress test 06/20/2019.   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 Uterine polyp   . Wears glasses      Allergies  Allergen Reactions  . Peanut-Containing Drug Products Other (See Comments)    Patient states that her throat swells.  Rolan Lipa [Linaclotide] Rash  . Iodinated Diagnostic Agents Nausea And Vomiting    MRI dye  nausea  . Oxycontin [Oxycodone Hcl] Nausea And Vomiting  . Penicillins Hives  . Shellfish Allergy Other (See Comments)  . Sulfa Antibiotics Hives  . Topamax [Topiramate] Hives    Current Outpatient Medications on File Prior to Visit  Medication Sig  . albuterol (PROAIR HFA) 108 (90 Base) MCG/ACT inhaler Inhale 2 puffs into the lungs every 6 (six)  hours as needed for wheezing or shortness of breath.  Marland Kitchen aspirin EC 81 MG tablet Take 81 mg by mouth daily.  Marland Kitchen BREO ELLIPTA 200-25 MCG/INH AEPB Inhale 1 puff into the lungs daily.  . cetirizine (ZYRTEC) 10 MG tablet Take 10 mg by mouth every evening.  . Cholecalciferol (VITAMIN D) 125 MCG (5000 UT) CAPS Take 1 capsule by mouth. Takes 5,000 units every 2 to 3 days.  Marland Kitchen EPINEPHrine 0.3 mg/0.3 mL IJ SOAJ injection Inject 0.3 mg into the muscle as needed.   . fluticasone (CUTIVATE) 0.05 % cream Apply 1 application topically 2 (two) times daily.   . fluticasone (FLONASE) 50 MCG/ACT nasal spray   . Fluticasone-Salmeterol (ADVAIR DISKUS) 250-50 MCG/DOSE AEPB Inhale 1 puff into the lungs every 12 (twelve) hours.  . gabapentin (NEURONTIN) 800 MG tablet Take 1 tablet 4 x /day with Meals & Bedtime for Neuropathic Pain  . HYDROcodone-acetaminophen (NORCO) 7.5-325 MG tablet Take 1 tablet by mouth. Takes 1 tablet 3 times a day.  . methocarbamol (ROBAXIN) 500 MG tablet Take 1 tablet (500 mg total) by mouth 3 (three) times daily as needed for muscle spasms.  . montelukast (SINGULAIR) 10 MG tablet Take 1 tablet (10 mg total) by mouth at bedtime.  . Olopatadine HCl (PATADAY) 0.2 % SOLN Apply to each eye as needed  . pantoprazole (PROTONIX) 40 MG tablet Take 1 tablet (40 mg total) by mouth 2 (two) times daily before a meal.  . polyethylene glycol (MIRALAX / GLYCOLAX) 17 g packet Take 17 g by mouth daily.  Marland Kitchen senna (SENOKOT) 8.6 MG tablet Take 1 tablet by mouth daily as needed.   . tretinoin (RETIN-A) 0.025 % gel Apply topically at bedtime.  Marland Kitchen venlafaxine XR (EFFEXOR XR) 37.5 MG 24 hr capsule Take 1 capsule (37.5 mg total) by mouth daily with breakfast.   No current facility-administered medications on file prior to visit.    ROS: all negative except above.   Physical Exam:  LMP 02/13/2016   General Appearance: Well nourished, in no apparent distress. Eyes: PERRLA, EOMs, conjunctiva no swelling or  erythema Sinuses: No Frontal/maxillary tenderness ENT/Mouth: Ext aud canals clear, TMs without erythema, bulging. No erythema, swelling, or exudate on post pharynx.  Tonsils not swollen or erythematous. Hearing normal.  Neck: Supple, thyroid normal.  Respiratory: Respiratory effort normal, BS equal bilaterally without rales, rhonchi, wheezing or stridor.  Cardio: RRR with no MRGs. Brisk peripheral pulses without edema.  Abdomen: Soft, + BS.  Non tender, no guarding, rebound, hernias, masses. Lymphatics: Non tender without lymphadenopathy.  Musculoskeletal: Full ROM, 5/5 strength, normal gait.  Skin: Warm, dry without rashes, lesions, ecchymosis.  Neuro: Cranial nerves intact. Normal muscle tone, no cerebellar symptoms. Sensation intact.  Psych: Awake and oriented X 3, normal affect, Insight and Judgment appropriate.     Izora Ribas, NP 12:47 PM [facility]

## 2020-02-04 ENCOUNTER — Ambulatory Visit: Payer: Medicare Other | Admitting: Adult Health

## 2020-02-05 DIAGNOSIS — J3089 Other allergic rhinitis: Secondary | ICD-10-CM | POA: Diagnosis not present

## 2020-02-05 DIAGNOSIS — G5601 Carpal tunnel syndrome, right upper limb: Secondary | ICD-10-CM | POA: Diagnosis not present

## 2020-02-05 DIAGNOSIS — J301 Allergic rhinitis due to pollen: Secondary | ICD-10-CM | POA: Diagnosis not present

## 2020-02-05 DIAGNOSIS — J3081 Allergic rhinitis due to animal (cat) (dog) hair and dander: Secondary | ICD-10-CM | POA: Diagnosis not present

## 2020-02-08 DIAGNOSIS — K2100 Gastro-esophageal reflux disease with esophagitis, without bleeding: Secondary | ICD-10-CM | POA: Diagnosis not present

## 2020-02-08 DIAGNOSIS — K5903 Drug induced constipation: Secondary | ICD-10-CM | POA: Diagnosis not present

## 2020-02-08 DIAGNOSIS — R1310 Dysphagia, unspecified: Secondary | ICD-10-CM | POA: Diagnosis not present

## 2020-03-24 DIAGNOSIS — Z1159 Encounter for screening for other viral diseases: Secondary | ICD-10-CM | POA: Diagnosis not present

## 2020-03-26 DIAGNOSIS — R1310 Dysphagia, unspecified: Secondary | ICD-10-CM | POA: Diagnosis not present

## 2020-03-26 DIAGNOSIS — K219 Gastro-esophageal reflux disease without esophagitis: Secondary | ICD-10-CM | POA: Diagnosis not present

## 2020-03-26 DIAGNOSIS — R12 Heartburn: Secondary | ICD-10-CM | POA: Diagnosis not present

## 2020-04-01 DIAGNOSIS — K219 Gastro-esophageal reflux disease without esophagitis: Secondary | ICD-10-CM | POA: Diagnosis not present

## 2020-04-23 ENCOUNTER — Encounter: Payer: Self-pay | Admitting: Internal Medicine

## 2020-04-24 DIAGNOSIS — H2513 Age-related nuclear cataract, bilateral: Secondary | ICD-10-CM | POA: Diagnosis not present

## 2020-04-24 DIAGNOSIS — H0102B Squamous blepharitis left eye, upper and lower eyelids: Secondary | ICD-10-CM | POA: Diagnosis not present

## 2020-04-24 DIAGNOSIS — H31091 Other chorioretinal scars, right eye: Secondary | ICD-10-CM | POA: Diagnosis not present

## 2020-04-24 DIAGNOSIS — H1045 Other chronic allergic conjunctivitis: Secondary | ICD-10-CM | POA: Diagnosis not present

## 2020-04-24 DIAGNOSIS — H16223 Keratoconjunctivitis sicca, not specified as Sjogren's, bilateral: Secondary | ICD-10-CM | POA: Diagnosis not present

## 2020-04-24 DIAGNOSIS — H0102A Squamous blepharitis right eye, upper and lower eyelids: Secondary | ICD-10-CM | POA: Diagnosis not present

## 2020-05-05 ENCOUNTER — Other Ambulatory Visit: Payer: Self-pay | Admitting: Physician Assistant

## 2020-05-05 DIAGNOSIS — R1031 Right lower quadrant pain: Secondary | ICD-10-CM | POA: Diagnosis not present

## 2020-05-05 DIAGNOSIS — K5904 Chronic idiopathic constipation: Secondary | ICD-10-CM | POA: Diagnosis not present

## 2020-05-05 DIAGNOSIS — K219 Gastro-esophageal reflux disease without esophagitis: Secondary | ICD-10-CM | POA: Diagnosis not present

## 2020-05-13 DIAGNOSIS — G5601 Carpal tunnel syndrome, right upper limb: Secondary | ICD-10-CM | POA: Diagnosis not present

## 2020-05-15 ENCOUNTER — Other Ambulatory Visit: Payer: Self-pay

## 2020-05-15 ENCOUNTER — Ambulatory Visit
Admission: RE | Admit: 2020-05-15 | Discharge: 2020-05-15 | Disposition: A | Discharge status: Home / Self Care | Payer: Medicare Other | Source: Ambulatory Visit | Attending: Physician Assistant | Admitting: Physician Assistant

## 2020-05-15 DIAGNOSIS — K5900 Constipation, unspecified: Secondary | ICD-10-CM | POA: Diagnosis not present

## 2020-05-15 DIAGNOSIS — R1031 Right lower quadrant pain: Secondary | ICD-10-CM

## 2020-05-15 MED ORDER — IOPAMIDOL (ISOVUE-300) INJECTION 61%
100.0000 mL | Freq: Once | INTRAVENOUS | Status: AC | PRN
  Administered 2020-05-15: 100 mL via INTRAVENOUS

## 2020-06-03 DIAGNOSIS — J3081 Allergic rhinitis due to animal (cat) (dog) hair and dander: Secondary | ICD-10-CM | POA: Diagnosis not present

## 2020-06-03 DIAGNOSIS — H1045 Other chronic allergic conjunctivitis: Secondary | ICD-10-CM | POA: Diagnosis not present

## 2020-06-03 DIAGNOSIS — J4530 Mild persistent asthma, uncomplicated: Secondary | ICD-10-CM | POA: Diagnosis not present

## 2020-06-03 DIAGNOSIS — J3089 Other allergic rhinitis: Secondary | ICD-10-CM | POA: Diagnosis not present

## 2020-06-04 DIAGNOSIS — Z79891 Long term (current) use of opiate analgesic: Secondary | ICD-10-CM | POA: Diagnosis not present

## 2020-06-04 DIAGNOSIS — Z882 Allergy status to sulfonamides status: Secondary | ICD-10-CM | POA: Diagnosis not present

## 2020-06-04 DIAGNOSIS — Z79899 Other long term (current) drug therapy: Secondary | ICD-10-CM | POA: Diagnosis not present

## 2020-06-04 DIAGNOSIS — J45909 Unspecified asthma, uncomplicated: Secondary | ICD-10-CM | POA: Diagnosis not present

## 2020-06-04 DIAGNOSIS — R1030 Lower abdominal pain, unspecified: Secondary | ICD-10-CM | POA: Diagnosis not present

## 2020-06-04 DIAGNOSIS — K5900 Constipation, unspecified: Secondary | ICD-10-CM | POA: Diagnosis not present

## 2020-06-04 DIAGNOSIS — Z880 Allergy status to penicillin: Secondary | ICD-10-CM | POA: Diagnosis not present

## 2020-06-04 DIAGNOSIS — G8929 Other chronic pain: Secondary | ICD-10-CM | POA: Diagnosis not present

## 2020-06-06 DIAGNOSIS — K5904 Chronic idiopathic constipation: Secondary | ICD-10-CM | POA: Diagnosis not present

## 2020-06-06 DIAGNOSIS — K2100 Gastro-esophageal reflux disease with esophagitis, without bleeding: Secondary | ICD-10-CM | POA: Diagnosis not present

## 2020-06-07 DIAGNOSIS — J3089 Other allergic rhinitis: Secondary | ICD-10-CM | POA: Diagnosis not present

## 2020-06-07 DIAGNOSIS — J3081 Allergic rhinitis due to animal (cat) (dog) hair and dander: Secondary | ICD-10-CM | POA: Diagnosis not present

## 2020-06-07 DIAGNOSIS — J301 Allergic rhinitis due to pollen: Secondary | ICD-10-CM | POA: Diagnosis not present

## 2020-06-11 NOTE — Progress Notes (Signed)
MEDICARE ANNUAL WELLNESS VISIT AND FU  Assessment:   Encounter for Medicare annual wellness exam 1 year Mammogram scheduled  CMT (Charcot-Marie-Tooth disease) Continue follow up with pain management, neuro  Mixed hyperlipidemia -continue medications, check lipids, decrease fatty foods, increase activity.   Medication management  Hypertension, unspecified type - continue medications, DASH diet, exercise and monitor at home. Call if greater than 130/80.   Abnormal glucose monitor  Pain in joint, multiple sites. - continue follow up pain management  Vitamin D deficiency Continue supplementation Check vitamin D level  Gastroesophageal reflux disease, esophagitis presence not specified -   Continue PPI/H2 blocker, diet discussed  Chronic asthma without complication, unspecified asthma severity, unspecified whether persistent -    Continue meds  BMI 25 - increase veggies, decrease carbs - long discussion about weight loss, diet, and exercise  Right lower quadrant abdominal pain Chronic for many years with unremarkable workup Has had negative CT AB, s/p hysterectomy, had normal visit with GYN with normal pelvic US.  PT/pelvic floor therapy without benfit Would need colonoscopy - not due per GI for 3 years; insurance won't cover If both of these negative? Adhesions.    Constipation, unspecified constipation type GI following; improved with movantik; ? Trying new agent but unsure what - Increase fiber/ water intake, decrease caffeine, increase activity level  Other fatigue Has had negative sleep study; felt secondary to chronic fatigue; monitor  Anxiety Well managed by current regimen; continue medications  Stress management techniques discussed, increase water, good sleep hygiene discussed, increase exercise, and increase veggies.    Over 40 minutes of exam, counseling, chart review and critical decision making was performed Future Appointments  Date Time  Provider [place]  07/31/2020 11:20 AM Revankar, Reita Cliche, MD [facility] None  08/05/2020 10:50 AM [facility] MM 2 [facility]  12/17/2020  3:00 PM Unk Pinto, MD [facility] None  06/15/2021  3:30 PM Liane Comber, NP [facility] None     Plan:   During the course of the visit the patient was educated and counseled about appropriate screening and preventive services including:    Pneumococcal vaccine   Prevnar 13  Influenza vaccine  Td vaccine  Screening electrocardiogram  Bone densitometry screening  Colorectal cancer screening  Diabetes screening  Glaucoma screening  Nutrition counseling   Advanced directives: requested   Subjective:  Brandi Erickson is a 50 y.o. female who presents for Medicare Annual Wellness Visit and follow up for predM, chol, HTN.  She has chronic pain, was following with Dr. Andree Elk preferred pain, but reports hasn't been since 2021. Denies norco recently. Has been using CBD gummies with benefit. Has history of CMT, neck pain. Also RLM at night felt secondary to pain. Has TMJ and has new mouth piece from dentist.Has has a normal sleep study with Dr. Shela Leff 09/27/2018. Hx of pineal gland cyst, [facility] neuro following, stable per MRI 07/27/2018.   Asthma on Breo and well controlled.   Anxiety on effexor 37.5 mg daily and doing well. Was on Cymbalta with weight gain, declines. She does go to counseling intermittently, plans to restart now that pandemic is improving.   She has history of GERD, had normal EGD 02/2020, on PPI. Has had lower R sided sharp abdominal pain for years; prior to her partial hysterectomy/left salpingectomy in 2018. Does have hx of ectopic pregnancy and was advised "lots of scarring." Had normal pelvic by Dr. Jerl Santos, normal AB [place]. She was referred to PT for pelvic floor therapy without benefit. Had  recent CT abd/pelvist 04/2020 that was benign other than large stool burden. Baseline BM every other day, GI has started her  on miralax, senokot, linzess, amitiza without benefit; recently had benefit with movantik but cost limit, trying another agent but unsure what. GI is managing. Some question of adhesions. Colonoscopy - reports had in 10/2012.   BMI is Body mass index is 26.46 kg/m., she has been working on diet and exercise. Wt Readings from Last 3 Encounters:  06/12/20 159 lb (72.1 kg)  12/12/19 154 lb (69.9 kg)  07/18/19 147 lb (66.7 kg)   Her blood pressure has been controlled at home, today their BP is BP: 110/70   She does workout, walks 2 miles a day.  She denies chest pain, shortness of breath, dizziness.  Saw Dr. Geraldo Pitter in cardiology for possible syncopal episodes, had normal stress test 06/20/2019.  Reports has upcoming routine follow up.    She is not on cholesterol medication and denies myalgias. Her cholesterol is at goal. The cholesterol last visit was:   Lab Results  Component Value Date   CHOL 195 12/12/2019   HDL 61 12/12/2019   LDLCALC 117 (H) 12/12/2019   TRIG 80 12/12/2019   CHOLHDL 3.2 12/12/2019    She has been working on diet and exercise for glucose management, and denies polydipsia, polyuria and visual disturbances. Last A1C in the office was:  Lab Results  Component Value Date   HGBA1C 5.5 12/12/2019    Last GFR:  Lab Results  Component Value Date   GFRNONAA 93 12/12/2019   Patient is on Vitamin D supplement, ? 5000 IU daily.    Lab Results  Component Value Date   VD25OH 40 12/12/2019       Medication Review:   Current Outpatient Medications (Cardiovascular):  Marland Kitchen  EPINEPHrine 0.3 mg/0.3 mL IJ SOAJ injection, Inject 0.3 mg into the muscle as needed.  (Patient not taking: Reported on 06/12/2020)  Current Outpatient Medications (Respiratory):  .  albuterol (PROAIR HFA) 108 (90 Base) MCG/ACT inhaler, Inhale 2 puffs into the lungs every 6 (six) hours as needed for wheezing or shortness of breath. Marland Kitchen  BREO ELLIPTA 200-25 MCG/INH AEPB, Inhale 1 puff into the lungs  daily. .  cetirizine (ZYRTEC) 10 MG tablet, Take 10 mg by mouth every evening. .  fluticasone (FLONASE) 50 MCG/ACT nasal spray,  .  montelukast (SINGULAIR) 10 MG tablet, Take 1 tablet (10 mg total) by mouth at bedtime. .  Fluticasone-Salmeterol (ADVAIR DISKUS) 250-50 MCG/DOSE AEPB, Inhale 1 puff into the lungs every 12 (twelve) hours. (Patient not taking: Reported on 06/12/2020)  Current Outpatient Medications (Analgesics):  .  aspirin EC 81 MG tablet, Take 81 mg by mouth daily. (Patient not taking: Reported on 06/12/2020) .  HYDROcodone-acetaminophen (NORCO) 7.5-325 MG tablet, Take 1 tablet by mouth. Takes 1 tablet 3 times a day.   Current Outpatient Medications (Other):  .  fluticasone (CUTIVATE) 0.05 % cream, Apply 1 application topically 2 (two) times daily.  Marland Kitchen  gabapentin (NEURONTIN) 800 MG tablet, Take 1 tablet 4 x /day with Meals & Bedtime for Neuropathic Pain .  Olopatadine HCl (PATADAY) 0.2 % SOLN, Apply to each eye as needed .  pantoprazole (PROTONIX) 40 MG tablet, Take 1 tablet (40 mg total) by mouth 2 (two) times daily before a meal. .  polyethylene glycol (MIRALAX / GLYCOLAX) 17 g packet, Take 17 g by mouth daily. Marland Kitchen  senna (SENOKOT) 8.6 MG tablet, Take 1 tablet by mouth daily as needed.  Marland Kitchen  tretinoin (RETIN-A) 0.025 % gel, Apply topically at bedtime. Marland Kitchen  venlafaxine XR (EFFEXOR XR) 37.5 MG 24 hr capsule, Take 1 capsule (37.5 mg total) by mouth daily with breakfast. .  Cholecalciferol (VITAMIN D) 125 MCG (5000 UT) CAPS, Take 1 capsule by mouth. Takes 5,000 units every 2 to 3 days. (Patient not taking: Reported on 06/12/2020) .  methocarbamol (ROBAXIN) 500 MG tablet, Take 1 tablet (500 mg total) by mouth 3 (three) times daily as needed for muscle spasms. (Patient not taking: Reported on 06/12/2020)   Allergies  Allergen Reactions  . Peanut-Containing Drug Products Other (See Comments)    Patient states that her throat swells.  Rolan Lipa [Linaclotide] Rash  . Iodinated Diagnostic  Agents Nausea And Vomiting    MRI dye  nausea  . Oxycontin [Oxycodone Hcl] Nausea And Vomiting  . Penicillins Hives  . Shellfish Allergy Other (See Comments)  . Sulfa Antibiotics Hives  . Topamax [Topiramate] Hives    Current Problems (verified) Patient Active Problem List   Diagnosis Date Noted  . 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 RLS (restless legs syndrome) 08/15/2018  . Chronic use of opiate drug for therapeutic purpose 08/15/2018  . TMJ (temporomandibular joint syndrome) 08/07/2018  . Pain in left foot 11/21/2017  . Anxiety 09/14/2017  . 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 Mixed hyperlipidemia 09/13/2014  . Abnormal glucose 09/13/2014  . Other fatigue 09/11/2014  . Vitamin D deficiency 09/11/2014  . Pain in joint, multiple sites 09/11/2014  . Medication management 09/11/2014  . GERD 08/14/2012  . Asthma, chronic 08/14/2012  . CMT (Charcot-Marie-Tooth disease) 08/14/2012  . Constipation 08/14/2012  . Esophageal dysphagia 06/25/2011    Screening Tests Immunization History  Administered Date(s) Administered  . PFIZER(Purple Top)SARS-COV-2 Vaccination 09/02/2019, 09/23/2019, 04/01/2020  . PPD Test 08/26/2015, 09/16/2016  . Tdap 09/16/2016   Preventative care: Last colonoscopy: 10/2012 per patient, next due in 3 years  EGD 03/26/2020 normal  Last mammogram: 09/2017- has scheduled April 2022 Last pap smear/pelvic exam: s/p hysterectomy Dr. Jerl Santos DEXA: N/A  MRI [facility] 07/27/2018 - no change in pineal cyst. - has scheduled Ct AB/pelvis 05/15/2020 - normal other than large amount of stool   Prior vaccinations: TD or Tdap: 2018  Influenza: 2020, declines Pneumococcal: N/A Prevnar13: N/A shingrix: N/A COVID 19: 3/3, 2021, pfizer   Names of Other Physician/Practitioners you currently use: 1. [facility] here for primary care 2. Dr. Katy Fitch, eye doctor, last visit 2022 3. Dr. Tera Mater, dentist, last visit  2020  Patient Care Team: Unk Pinto, MD as PCP - General (Internal Medicine) Blanch Media, MD as Consulting Physician (Neurology) Renie Ora, MD as Consulting Physician (Anesthesiology) Wylene Simmer, MD as Consulting Physician (Orthopedic Surgery) Susa Day, MD as Consulting Physician (Orthopedic Surgery) Rogene Houston, MD as Consulting Physician (Gastroenterology)  SURGICAL HISTORY She  has a past surgical history that includes Esophageal manometry (05/17/2011); Tonsillectomy; tubal reconstruction (1997); Tubal ligation (1999); Dilation and curettage of uterus; Colonoscopy (N/A, 11/01/2012); Foot neuroma surgery (Right, 06-05-2013); Tonsillectomy (as child); Hysteroscopy with D & C (N/A, 06/27/2013); laparoscopy (N/A, 06/27/2013); and Laparoscopic total hysterectomy (2018). FAMILY HISTORY Her family history includes Brain cancer in her sister; Breast cancer in her sister. SOCIAL HISTORY She  reports that she has never smoked. She has never used smokeless tobacco. She reports that she does not drink alcohol and does not use drugs.    MEDICARE WELLNESS OBJECTIVES: Physical activity: Current Exercise Habits: Home exercise routine, Type  of exercise: walking, Time (Minutes): 30, Frequency (Times/Week): 3, Weekly Exercise (Minutes/Week): 90, Intensity: Mild, Exercise limited by: orthopedic condition(s) Cardiac risk factors: Cardiac Risk Factors include: none Depression/mood screen:   Depression screen [facility] 2/9 06/12/2020  Decreased Interest 0  Down, Depressed, Hopeless 1  PHQ - 2 Score 1    ADLs:  In your present state of health, do you have any difficulty performing the following activities: 06/12/2020 12/12/2019  Hearing? N N  Vision? N N  Difficulty concentrating or making decisions? N N  Walking or climbing stairs? N N  Dressing or bathing? N N  Doing errands, shopping? N N  Some recent data might be hidden     Cognitive Testing  Alert? Yes  Normal  Appearance?Yes  Oriented to person? Yes  Place? Yes   Time? Yes  Recall of three objects?  Yes  Can perform simple calculations? Yes  Displays appropriate judgment?Yes  Can read the correct time from a watch face?Yes  EOL planning: Does Patient Have a Medical Advance Directive?: No Would patient like information on creating a medical advance directive?: Yes (MAU/Ambulatory/Procedural Areas - Information given)    Review of Systems  Constitutional: Positive for malaise/fatigue. Negative for chills, diaphoresis, fever and weight loss.  HENT: Negative for congestion, ear discharge, ear pain, hearing loss, nosebleeds, sore throat and tinnitus.   Eyes: Negative.   Respiratory: Negative for cough, hemoptysis, sputum production, shortness of breath, wheezing and stridor.   Cardiovascular: Negative for chest pain, palpitations, orthopnea, claudication and leg swelling.  Gastrointestinal: Positive for abdominal pain (RLQ, chronic) and constipation. Negative for blood in stool, diarrhea, heartburn, melena, nausea and vomiting.  Genitourinary: Negative.   Musculoskeletal: Positive for back pain, joint pain, myalgias and neck pain. Negative for falls.  Skin: Negative.  Negative for rash.  Neurological: Positive for headaches. Negative for dizziness, tingling, tremors, sensory change, speech change, focal weakness, seizures, loss of consciousness and weakness.  Psychiatric/Behavioral: Negative.  Negative for depression and memory loss. The patient does not have insomnia.      Objective:     Today's Vitals   06/12/20 1418  BP: 110/70  Pulse: 64  Temp: (!) 97.5 F (36.4 C)  SpO2: 97%  Weight: 159 lb (72.1 kg)   Body mass index is 26.46 kg/m.  General appearance: alert, no distress, WD/WN, female HEENT:  normocephalic, sclerae anicteric, TMs pearly, nares patent, no discharge or erythema, pharynx normal Oral cavity: MMM, no lesions Neck: supple, no lymphadenopathy, no thyromegaly, no  masses Heart: RRR, normal S1, S2, no murmurs Lungs: CTA bilaterally, no wheezes, rhonchi, or rales Abdomen: +bs, soft, RLQ tender without rebound, non distended, no masses, no hepatomegaly, no splenomegaly Musculoskeletal: nontender, no swelling, no obvious deformity Extremities: no edema, no cyanosis, no clubbing Pulses: 2+ symmetric, upper and lower extremities, normal cap refill Neurological: alert, oriented x 3, CN2-12 intact, strength normal upper extremities and lower extremities, sensation normal throughout, DTRs 2+ throughout, no cerebellar signs, gait normal Psychiatric: normal affect, behavior normal, pleasant   Medicare Attestation I have personally reviewed: The patient's medical and social history Their use of alcohol, tobacco or illicit drugs Their current medications and supplements The patient's functional ability including ADLs,fall risks, home safety risks, cognitive, and hearing and visual impairment Diet and physical activities Evidence for depression or mood disorders  The patient's weight, height, BMI, and visual acuity have been recorded in the chart.  I have made referrals, counseling, and provided education to the patient based on review of the  above and I have provided the patient with a written personalized care plan for preventive services.     Izora Ribas, NP   06/12/2020

## 2020-06-12 ENCOUNTER — Ambulatory Visit (INDEPENDENT_AMBULATORY_CARE_PROVIDER_SITE_OTHER): Payer: Medicare Other | Admitting: Adult Health

## 2020-06-12 ENCOUNTER — Other Ambulatory Visit: Payer: Self-pay | Admitting: Adult Health

## 2020-06-12 ENCOUNTER — Encounter: Payer: Self-pay | Admitting: Adult Health

## 2020-06-12 ENCOUNTER — Telehealth: Payer: Self-pay

## 2020-06-12 ENCOUNTER — Other Ambulatory Visit: Payer: Self-pay | Admitting: Internal Medicine

## 2020-06-12 ENCOUNTER — Other Ambulatory Visit: Payer: Self-pay

## 2020-06-12 VITALS — BP 110/70 | HR 64 | Temp 97.5°F | Wt 159.0 lb

## 2020-06-12 DIAGNOSIS — Z79891 Long term (current) use of opiate analgesic: Secondary | ICD-10-CM | POA: Diagnosis not present

## 2020-06-12 DIAGNOSIS — R6889 Other general symptoms and signs: Secondary | ICD-10-CM

## 2020-06-12 DIAGNOSIS — Z79899 Other long term (current) drug therapy: Secondary | ICD-10-CM | POA: Diagnosis not present

## 2020-06-12 DIAGNOSIS — K5909 Other constipation: Secondary | ICD-10-CM

## 2020-06-12 DIAGNOSIS — R5383 Other fatigue: Secondary | ICD-10-CM

## 2020-06-12 DIAGNOSIS — F419 Anxiety disorder, unspecified: Secondary | ICD-10-CM

## 2020-06-12 DIAGNOSIS — E348 Other specified endocrine disorders: Secondary | ICD-10-CM

## 2020-06-12 DIAGNOSIS — K219 Gastro-esophageal reflux disease without esophagitis: Secondary | ICD-10-CM

## 2020-06-12 DIAGNOSIS — G600 Hereditary motor and sensory neuropathy: Secondary | ICD-10-CM | POA: Diagnosis not present

## 2020-06-12 DIAGNOSIS — M26609 Unspecified temporomandibular joint disorder, unspecified side: Secondary | ICD-10-CM | POA: Diagnosis not present

## 2020-06-12 DIAGNOSIS — J45909 Unspecified asthma, uncomplicated: Secondary | ICD-10-CM | POA: Diagnosis not present

## 2020-06-12 DIAGNOSIS — H903 Sensorineural hearing loss, bilateral: Secondary | ICD-10-CM

## 2020-06-12 DIAGNOSIS — Z1231 Encounter for screening mammogram for malignant neoplasm of breast: Secondary | ICD-10-CM

## 2020-06-12 DIAGNOSIS — L918 Other hypertrophic disorders of the skin: Secondary | ICD-10-CM

## 2020-06-12 DIAGNOSIS — E559 Vitamin D deficiency, unspecified: Secondary | ICD-10-CM

## 2020-06-12 DIAGNOSIS — Z0001 Encounter for general adult medical examination with abnormal findings: Secondary | ICD-10-CM

## 2020-06-12 DIAGNOSIS — R3129 Other microscopic hematuria: Secondary | ICD-10-CM

## 2020-06-12 DIAGNOSIS — E782 Mixed hyperlipidemia: Secondary | ICD-10-CM

## 2020-06-12 DIAGNOSIS — Z0000 Encounter for general adult medical examination without abnormal findings: Secondary | ICD-10-CM

## 2020-06-12 DIAGNOSIS — G2581 Restless legs syndrome: Secondary | ICD-10-CM

## 2020-06-12 DIAGNOSIS — R1319 Other dysphagia: Secondary | ICD-10-CM

## 2020-06-12 DIAGNOSIS — R7309 Other abnormal glucose: Secondary | ICD-10-CM

## 2020-06-12 DIAGNOSIS — L709 Acne, unspecified: Secondary | ICD-10-CM

## 2020-06-12 NOTE — Telephone Encounter (Signed)
Patient is requesting a referral to [facility] for acne & a skin tag on her left eye.

## 2020-06-13 ENCOUNTER — Encounter: Payer: Self-pay | Admitting: Adult Health

## 2020-06-13 DIAGNOSIS — E538 Deficiency of other specified B group vitamins: Secondary | ICD-10-CM

## 2020-06-13 HISTORY — DX: Deficiency of other specified B group vitamins: E53.8

## 2020-06-13 LAB — COMPLETE METABOLIC PANEL WITH GFR
AG Ratio: 1.5 (calc) (ref 1.0–2.5)
ALT: 25 U/L (ref 6–29)
AST: 23 U/L (ref 10–35)
Albumin: 4.6 g/dL (ref 3.6–5.1)
Alkaline phosphatase (APISO): 87 U/L (ref 37–153)
BUN: 12 mg/dL (ref 7–25)
CO2: 29 mmol/L (ref 20–32)
Calcium: 10.2 mg/dL (ref 8.6–10.4)
Chloride: 102 mmol/L (ref 98–110)
Creat: 0.76 mg/dL (ref 0.50–1.05)
GFR, Est African American: 106 mL/min/{1.73_m2} (ref >=60)
GFR, Est Non African American: 91 mL/min/{1.73_m2} (ref >=60)
Globulin: 3 g/dL (calc) (ref 1.9–3.7)
Glucose, Bld: 84 mg/dL (ref 65–99)
Potassium: 4.4 mmol/L (ref 3.5–5.3)
Sodium: 137 mmol/L (ref 135–146)
Total Bilirubin: 0.3 mg/dL (ref 0.2–1.2)
Total Protein: 7.6 g/dL (ref 6.1–8.1)

## 2020-06-13 LAB — URINALYSIS W MICROSCOPIC + REFLEX CULTURE
Bacteria, UA: NONE SEEN /HPF
Bilirubin Urine: NEGATIVE
Glucose, UA: NEGATIVE
Hgb urine dipstick: NEGATIVE
Hyaline Cast: NONE SEEN /LPF
Ketones, ur: NEGATIVE
Leukocyte Esterase: NEGATIVE
Nitrites, Initial: NEGATIVE
Protein, ur: NEGATIVE
RBC / HPF: NONE SEEN /HPF (ref 0–2)
Specific Gravity, Urine: 1.019 (ref 1.001–1.03)
Squamous Epithelial / HPF: NONE SEEN /HPF (ref <=5)
WBC, UA: NONE SEEN /HPF (ref 0–5)
pH: <=5 (ref 5.0–8.0)

## 2020-06-13 LAB — CBC WITH DIFFERENTIAL/PLATELET
Absolute Monocytes: 380 cells/uL (ref 200–950)
Basophils Absolute: 20 cells/uL (ref 0–200)
Basophils Relative: 0.4 %
Eosinophils Absolute: 40 cells/uL (ref 15–500)
Eosinophils Relative: 0.8 %
HCT: 43.4 % (ref 35.0–45.0)
Hemoglobin: 13.8 g/dL (ref 11.7–15.5)
Lymphs Abs: 2135 cells/uL (ref 850–3900)
MCH: 26 pg — ABNORMAL LOW (ref 27.0–33.0)
MCHC: 31.8 g/dL — ABNORMAL LOW (ref 32.0–36.0)
MCV: 81.7 fL (ref 80.0–100.0)
MPV: 10.1 fL (ref 7.5–12.5)
Monocytes Relative: 7.6 %
Neutro Abs: 2425 cells/uL (ref 1500–7800)
Neutrophils Relative %: 48.5 %
Platelets: 405 10*3/uL — ABNORMAL HIGH (ref 140–400)
RBC: 5.31 10*6/uL — ABNORMAL HIGH (ref 3.80–5.10)
RDW: 13.1 % (ref 11.0–15.0)
Total Lymphocyte: 42.7 %
WBC: 5 10*3/uL (ref 3.8–10.8)

## 2020-06-13 LAB — VITAMIN D 25 HYDROXY (VIT D DEFICIENCY, FRACTURES): Vit D, 25-Hydroxy: 42 ng/mL (ref 30–100)

## 2020-06-13 LAB — LIPID PANEL
Cholesterol: 211 mg/dL — ABNORMAL HIGH (ref <200)
HDL: 63 mg/dL (ref >=50)
LDL Cholesterol (Calc): 134 mg/dL (calc) — ABNORMAL HIGH
Non-HDL Cholesterol (Calc): 148 mg/dL (calc) — ABNORMAL HIGH (ref <130)
Total CHOL/HDL Ratio: 3.3 (calc) (ref <5.0)
Triglycerides: 51 mg/dL (ref <150)

## 2020-06-13 LAB — TSH: TSH: 1.71 mIU/L

## 2020-06-13 LAB — MAGNESIUM: Magnesium: 2.1 mg/dL (ref 1.5–2.5)

## 2020-06-13 LAB — NO CULTURE INDICATED

## 2020-06-13 LAB — VITAMIN B12: Vitamin B-12: 358 pg/mL (ref 200–1100)

## 2020-06-16 DIAGNOSIS — B078 Other viral warts: Secondary | ICD-10-CM | POA: Diagnosis not present

## 2020-06-16 DIAGNOSIS — L738 Other specified follicular disorders: Secondary | ICD-10-CM | POA: Diagnosis not present

## 2020-06-16 DIAGNOSIS — L821 Other seborrheic keratosis: Secondary | ICD-10-CM | POA: Diagnosis not present

## 2020-06-16 DIAGNOSIS — J301 Allergic rhinitis due to pollen: Secondary | ICD-10-CM | POA: Diagnosis not present

## 2020-06-16 DIAGNOSIS — J3081 Allergic rhinitis due to animal (cat) (dog) hair and dander: Secondary | ICD-10-CM | POA: Diagnosis not present

## 2020-06-16 DIAGNOSIS — J3089 Other allergic rhinitis: Secondary | ICD-10-CM | POA: Diagnosis not present

## 2020-06-18 DIAGNOSIS — J301 Allergic rhinitis due to pollen: Secondary | ICD-10-CM | POA: Diagnosis not present

## 2020-06-18 DIAGNOSIS — J3089 Other allergic rhinitis: Secondary | ICD-10-CM | POA: Diagnosis not present

## 2020-06-18 DIAGNOSIS — J3081 Allergic rhinitis due to animal (cat) (dog) hair and dander: Secondary | ICD-10-CM | POA: Diagnosis not present

## 2020-06-24 DIAGNOSIS — M21371 Foot drop, right foot: Secondary | ICD-10-CM | POA: Diagnosis not present

## 2020-06-24 DIAGNOSIS — M21372 Foot drop, left foot: Secondary | ICD-10-CM | POA: Diagnosis not present

## 2020-06-25 DIAGNOSIS — J3089 Other allergic rhinitis: Secondary | ICD-10-CM | POA: Diagnosis not present

## 2020-06-25 DIAGNOSIS — J3081 Allergic rhinitis due to animal (cat) (dog) hair and dander: Secondary | ICD-10-CM | POA: Diagnosis not present

## 2020-06-25 DIAGNOSIS — J301 Allergic rhinitis due to pollen: Secondary | ICD-10-CM | POA: Diagnosis not present

## 2020-06-30 DIAGNOSIS — J301 Allergic rhinitis due to pollen: Secondary | ICD-10-CM | POA: Diagnosis not present

## 2020-06-30 DIAGNOSIS — J3089 Other allergic rhinitis: Secondary | ICD-10-CM | POA: Diagnosis not present

## 2020-06-30 DIAGNOSIS — J3081 Allergic rhinitis due to animal (cat) (dog) hair and dander: Secondary | ICD-10-CM | POA: Diagnosis not present

## 2020-07-01 DIAGNOSIS — E348 Other specified endocrine disorders: Secondary | ICD-10-CM | POA: Diagnosis not present

## 2020-07-03 DIAGNOSIS — J301 Allergic rhinitis due to pollen: Secondary | ICD-10-CM | POA: Diagnosis not present

## 2020-07-03 DIAGNOSIS — J3089 Other allergic rhinitis: Secondary | ICD-10-CM | POA: Diagnosis not present

## 2020-07-03 DIAGNOSIS — J3081 Allergic rhinitis due to animal (cat) (dog) hair and dander: Secondary | ICD-10-CM | POA: Diagnosis not present

## 2020-07-07 DIAGNOSIS — J301 Allergic rhinitis due to pollen: Secondary | ICD-10-CM | POA: Diagnosis not present

## 2020-07-07 DIAGNOSIS — J3089 Other allergic rhinitis: Secondary | ICD-10-CM | POA: Diagnosis not present

## 2020-07-07 DIAGNOSIS — J3081 Allergic rhinitis due to animal (cat) (dog) hair and dander: Secondary | ICD-10-CM | POA: Diagnosis not present

## 2020-07-09 DIAGNOSIS — J3089 Other allergic rhinitis: Secondary | ICD-10-CM | POA: Diagnosis not present

## 2020-07-09 DIAGNOSIS — J3081 Allergic rhinitis due to animal (cat) (dog) hair and dander: Secondary | ICD-10-CM | POA: Diagnosis not present

## 2020-07-09 DIAGNOSIS — M7711 Lateral epicondylitis, right elbow: Secondary | ICD-10-CM | POA: Diagnosis not present

## 2020-07-09 DIAGNOSIS — J301 Allergic rhinitis due to pollen: Secondary | ICD-10-CM | POA: Diagnosis not present

## 2020-07-15 DIAGNOSIS — M25521 Pain in right elbow: Secondary | ICD-10-CM | POA: Diagnosis not present

## 2020-07-16 DIAGNOSIS — J3081 Allergic rhinitis due to animal (cat) (dog) hair and dander: Secondary | ICD-10-CM | POA: Diagnosis not present

## 2020-07-16 DIAGNOSIS — J3089 Other allergic rhinitis: Secondary | ICD-10-CM | POA: Diagnosis not present

## 2020-07-16 DIAGNOSIS — J301 Allergic rhinitis due to pollen: Secondary | ICD-10-CM | POA: Diagnosis not present

## 2020-07-18 DIAGNOSIS — M25521 Pain in right elbow: Secondary | ICD-10-CM | POA: Diagnosis not present

## 2020-07-18 DIAGNOSIS — J3081 Allergic rhinitis due to animal (cat) (dog) hair and dander: Secondary | ICD-10-CM | POA: Diagnosis not present

## 2020-07-18 DIAGNOSIS — J3089 Other allergic rhinitis: Secondary | ICD-10-CM | POA: Diagnosis not present

## 2020-07-18 DIAGNOSIS — J301 Allergic rhinitis due to pollen: Secondary | ICD-10-CM | POA: Diagnosis not present

## 2020-07-21 DIAGNOSIS — J301 Allergic rhinitis due to pollen: Secondary | ICD-10-CM | POA: Diagnosis not present

## 2020-07-21 DIAGNOSIS — J3081 Allergic rhinitis due to animal (cat) (dog) hair and dander: Secondary | ICD-10-CM | POA: Diagnosis not present

## 2020-07-21 DIAGNOSIS — J3089 Other allergic rhinitis: Secondary | ICD-10-CM | POA: Diagnosis not present

## 2020-07-23 ENCOUNTER — Other Ambulatory Visit: Payer: Self-pay

## 2020-07-23 DIAGNOSIS — K5909 Other constipation: Secondary | ICD-10-CM | POA: Insufficient documentation

## 2020-07-23 DIAGNOSIS — T7840XA Allergy, unspecified, initial encounter: Secondary | ICD-10-CM | POA: Insufficient documentation

## 2020-07-23 DIAGNOSIS — J3081 Allergic rhinitis due to animal (cat) (dog) hair and dander: Secondary | ICD-10-CM

## 2020-07-23 DIAGNOSIS — Z973 Presence of spectacles and contact lenses: Secondary | ICD-10-CM | POA: Insufficient documentation

## 2020-07-23 DIAGNOSIS — H1045 Other chronic allergic conjunctivitis: Secondary | ICD-10-CM

## 2020-07-23 DIAGNOSIS — J302 Other seasonal allergic rhinitis: Secondary | ICD-10-CM | POA: Insufficient documentation

## 2020-07-23 DIAGNOSIS — G629 Polyneuropathy, unspecified: Secondary | ICD-10-CM | POA: Insufficient documentation

## 2020-07-23 DIAGNOSIS — T781XXA Other adverse food reactions, not elsewhere classified, initial encounter: Secondary | ICD-10-CM

## 2020-07-23 DIAGNOSIS — J4530 Mild persistent asthma, uncomplicated: Secondary | ICD-10-CM

## 2020-07-23 DIAGNOSIS — J45909 Unspecified asthma, uncomplicated: Secondary | ICD-10-CM | POA: Insufficient documentation

## 2020-07-23 DIAGNOSIS — J309 Allergic rhinitis, unspecified: Secondary | ICD-10-CM

## 2020-07-23 DIAGNOSIS — J301 Allergic rhinitis due to pollen: Secondary | ICD-10-CM

## 2020-07-23 DIAGNOSIS — J3089 Other allergic rhinitis: Secondary | ICD-10-CM | POA: Diagnosis not present

## 2020-07-23 DIAGNOSIS — T7819XA Other adverse food reactions, not elsewhere classified, initial encounter: Secondary | ICD-10-CM | POA: Insufficient documentation

## 2020-07-23 DIAGNOSIS — N840 Polyp of corpus uteri: Secondary | ICD-10-CM | POA: Insufficient documentation

## 2020-07-23 HISTORY — DX: Allergic rhinitis due to animal (cat) (dog) hair and dander: J30.81

## 2020-07-23 HISTORY — DX: Allergic rhinitis due to pollen: J30.1

## 2020-07-23 HISTORY — DX: Other adverse food reactions, not elsewhere classified, initial encounter: T78.1XXA

## 2020-07-23 HISTORY — DX: Mild persistent asthma, uncomplicated: J45.30

## 2020-07-23 HISTORY — DX: Allergic rhinitis, unspecified: J30.9

## 2020-07-23 HISTORY — DX: Other chronic allergic conjunctivitis: H10.45

## 2020-07-24 DIAGNOSIS — M25521 Pain in right elbow: Secondary | ICD-10-CM | POA: Diagnosis not present

## 2020-07-24 DIAGNOSIS — L249 Irritant contact dermatitis, unspecified cause: Secondary | ICD-10-CM | POA: Diagnosis not present

## 2020-07-24 DIAGNOSIS — B078 Other viral warts: Secondary | ICD-10-CM | POA: Diagnosis not present

## 2020-07-28 DIAGNOSIS — J3081 Allergic rhinitis due to animal (cat) (dog) hair and dander: Secondary | ICD-10-CM | POA: Diagnosis not present

## 2020-07-28 DIAGNOSIS — J3089 Other allergic rhinitis: Secondary | ICD-10-CM | POA: Diagnosis not present

## 2020-07-28 DIAGNOSIS — J301 Allergic rhinitis due to pollen: Secondary | ICD-10-CM | POA: Diagnosis not present

## 2020-07-30 DIAGNOSIS — J3081 Allergic rhinitis due to animal (cat) (dog) hair and dander: Secondary | ICD-10-CM | POA: Diagnosis not present

## 2020-07-30 DIAGNOSIS — J301 Allergic rhinitis due to pollen: Secondary | ICD-10-CM | POA: Diagnosis not present

## 2020-07-30 DIAGNOSIS — J3089 Other allergic rhinitis: Secondary | ICD-10-CM | POA: Diagnosis not present

## 2020-07-31 ENCOUNTER — Ambulatory Visit: Payer: Medicare Other | Admitting: Cardiology

## 2020-07-31 ENCOUNTER — Telehealth: Payer: Self-pay | Admitting: Cardiology

## 2020-07-31 DIAGNOSIS — M25521 Pain in right elbow: Secondary | ICD-10-CM | POA: Diagnosis not present

## 2020-07-31 NOTE — Telephone Encounter (Signed)
Patient came in to ask if she could get a print off of some paperwork that Dr. Geraldo Pitter printed off for her last year sometime for her to get tested to see if she had any calcification in her arteries. She said it's not covered by insurance and it was going to be about $200. I looked in her chart and didn't see anything with the information. If you know what she is talking about could you print it off for her and mail to her address or let me know where to go to find the information and I'll be happy to send it to her as well. Thank you CB # [phone]

## 2020-07-31 NOTE — Telephone Encounter (Signed)
Put order in for Ct Cardiac scoring and sent message to [facility] to contact and schedule patient for this test.

## 2020-08-05 ENCOUNTER — Ambulatory Visit: Payer: Medicare Other

## 2020-08-05 DIAGNOSIS — R1031 Right lower quadrant pain: Secondary | ICD-10-CM | POA: Diagnosis not present

## 2020-08-05 DIAGNOSIS — K2100 Gastro-esophageal reflux disease with esophagitis, without bleeding: Secondary | ICD-10-CM | POA: Diagnosis not present

## 2020-08-05 DIAGNOSIS — R140 Abdominal distension (gaseous): Secondary | ICD-10-CM | POA: Diagnosis not present

## 2020-08-05 DIAGNOSIS — K5904 Chronic idiopathic constipation: Secondary | ICD-10-CM | POA: Diagnosis not present

## 2020-08-07 DIAGNOSIS — M7711 Lateral epicondylitis, right elbow: Secondary | ICD-10-CM | POA: Diagnosis not present

## 2020-08-13 DIAGNOSIS — M25521 Pain in right elbow: Secondary | ICD-10-CM | POA: Diagnosis not present

## 2020-08-13 DIAGNOSIS — J3089 Other allergic rhinitis: Secondary | ICD-10-CM | POA: Diagnosis not present

## 2020-08-13 DIAGNOSIS — J301 Allergic rhinitis due to pollen: Secondary | ICD-10-CM | POA: Diagnosis not present

## 2020-08-13 DIAGNOSIS — J3081 Allergic rhinitis due to animal (cat) (dog) hair and dander: Secondary | ICD-10-CM | POA: Diagnosis not present

## 2020-08-15 DIAGNOSIS — J3089 Other allergic rhinitis: Secondary | ICD-10-CM | POA: Diagnosis not present

## 2020-08-15 DIAGNOSIS — J301 Allergic rhinitis due to pollen: Secondary | ICD-10-CM | POA: Diagnosis not present

## 2020-08-15 DIAGNOSIS — J3081 Allergic rhinitis due to animal (cat) (dog) hair and dander: Secondary | ICD-10-CM | POA: Diagnosis not present

## 2020-08-18 DIAGNOSIS — J3081 Allergic rhinitis due to animal (cat) (dog) hair and dander: Secondary | ICD-10-CM | POA: Diagnosis not present

## 2020-08-18 DIAGNOSIS — J301 Allergic rhinitis due to pollen: Secondary | ICD-10-CM | POA: Diagnosis not present

## 2020-08-18 DIAGNOSIS — J3089 Other allergic rhinitis: Secondary | ICD-10-CM | POA: Diagnosis not present

## 2020-08-21 DIAGNOSIS — M25521 Pain in right elbow: Secondary | ICD-10-CM | POA: Diagnosis not present

## 2020-08-21 DIAGNOSIS — J301 Allergic rhinitis due to pollen: Secondary | ICD-10-CM | POA: Diagnosis not present

## 2020-08-21 DIAGNOSIS — J3089 Other allergic rhinitis: Secondary | ICD-10-CM | POA: Diagnosis not present

## 2020-08-21 DIAGNOSIS — J3081 Allergic rhinitis due to animal (cat) (dog) hair and dander: Secondary | ICD-10-CM | POA: Diagnosis not present

## 2020-08-26 ENCOUNTER — Ambulatory Visit (INDEPENDENT_AMBULATORY_CARE_PROVIDER_SITE_OTHER)
Admission: RE | Admit: 2020-08-26 | Discharge: 2020-08-26 | Disposition: A | Discharge status: Home / Self Care | Payer: Self-pay | Source: Ambulatory Visit | Attending: Cardiology | Admitting: Cardiology

## 2020-08-26 ENCOUNTER — Other Ambulatory Visit: Payer: Self-pay

## 2020-08-26 DIAGNOSIS — J3089 Other allergic rhinitis: Secondary | ICD-10-CM | POA: Diagnosis not present

## 2020-08-26 DIAGNOSIS — J301 Allergic rhinitis due to pollen: Secondary | ICD-10-CM | POA: Diagnosis not present

## 2020-08-26 DIAGNOSIS — J3081 Allergic rhinitis due to animal (cat) (dog) hair and dander: Secondary | ICD-10-CM | POA: Diagnosis not present

## 2020-08-26 DIAGNOSIS — R079 Chest pain, unspecified: Secondary | ICD-10-CM

## 2020-08-28 ENCOUNTER — Other Ambulatory Visit: Payer: Self-pay

## 2020-08-28 ENCOUNTER — Ambulatory Visit
Admission: RE | Admit: 2020-08-28 | Discharge: 2020-08-28 | Disposition: A | Discharge status: Home / Self Care | Payer: Medicare Other | Source: Ambulatory Visit | Attending: Internal Medicine | Admitting: Internal Medicine

## 2020-08-28 DIAGNOSIS — Z1231 Encounter for screening mammogram for malignant neoplasm of breast: Secondary | ICD-10-CM | POA: Diagnosis not present

## 2020-08-29 DIAGNOSIS — M25521 Pain in right elbow: Secondary | ICD-10-CM | POA: Diagnosis not present

## 2020-09-01 DIAGNOSIS — J3089 Other allergic rhinitis: Secondary | ICD-10-CM | POA: Diagnosis not present

## 2020-09-01 DIAGNOSIS — J301 Allergic rhinitis due to pollen: Secondary | ICD-10-CM | POA: Diagnosis not present

## 2020-09-01 DIAGNOSIS — J3081 Allergic rhinitis due to animal (cat) (dog) hair and dander: Secondary | ICD-10-CM | POA: Diagnosis not present

## 2020-09-02 DIAGNOSIS — M25521 Pain in right elbow: Secondary | ICD-10-CM | POA: Diagnosis not present

## 2020-09-04 DIAGNOSIS — M7711 Lateral epicondylitis, right elbow: Secondary | ICD-10-CM | POA: Diagnosis not present

## 2020-09-04 DIAGNOSIS — G5601 Carpal tunnel syndrome, right upper limb: Secondary | ICD-10-CM | POA: Diagnosis not present

## 2020-09-08 DIAGNOSIS — J3089 Other allergic rhinitis: Secondary | ICD-10-CM | POA: Diagnosis not present

## 2020-09-08 DIAGNOSIS — J301 Allergic rhinitis due to pollen: Secondary | ICD-10-CM | POA: Diagnosis not present

## 2020-09-08 DIAGNOSIS — J3081 Allergic rhinitis due to animal (cat) (dog) hair and dander: Secondary | ICD-10-CM | POA: Diagnosis not present

## 2020-09-09 DIAGNOSIS — M25521 Pain in right elbow: Secondary | ICD-10-CM | POA: Diagnosis not present

## 2020-09-15 DIAGNOSIS — J3089 Other allergic rhinitis: Secondary | ICD-10-CM | POA: Diagnosis not present

## 2020-09-15 DIAGNOSIS — M25521 Pain in right elbow: Secondary | ICD-10-CM | POA: Diagnosis not present

## 2020-09-15 DIAGNOSIS — J3081 Allergic rhinitis due to animal (cat) (dog) hair and dander: Secondary | ICD-10-CM | POA: Diagnosis not present

## 2020-09-15 DIAGNOSIS — J301 Allergic rhinitis due to pollen: Secondary | ICD-10-CM | POA: Diagnosis not present

## 2020-09-18 DIAGNOSIS — M25521 Pain in right elbow: Secondary | ICD-10-CM | POA: Diagnosis not present

## 2020-09-23 ENCOUNTER — Ambulatory Visit (INDEPENDENT_AMBULATORY_CARE_PROVIDER_SITE_OTHER): Payer: Medicare Other | Admitting: Cardiology

## 2020-09-23 ENCOUNTER — Other Ambulatory Visit: Payer: Self-pay

## 2020-09-23 ENCOUNTER — Encounter: Payer: Self-pay | Admitting: Cardiology

## 2020-09-23 VITALS — BP 118/66 | HR 60 | Ht 66.0 in | Wt 159.0 lb

## 2020-09-23 DIAGNOSIS — R079 Chest pain, unspecified: Secondary | ICD-10-CM

## 2020-09-23 DIAGNOSIS — E782 Mixed hyperlipidemia: Secondary | ICD-10-CM | POA: Diagnosis not present

## 2020-09-23 HISTORY — DX: Chest pain, unspecified: R07.9

## 2020-09-23 NOTE — Patient Instructions (Signed)
Medication Instructions:  No medication changes. *If you need a refill on your cardiac medications before your next appointment, please call your pharmacy*   Lab Work: None ordered If you have labs (blood work) drawn today and your tests are completely normal, you will receive your results only by: [place] (if you have MyChart) OR A paper copy in the mail If you have any lab test that is abnormal or we need to change your treatment, we will call you to review the results.   Testing/Procedures: None ordered   Follow-Up: At [facility], you and your health needs are our priority.  As part of our continuing mission to provide you with exceptional heart care, we have created designated Provider Care Teams.  These Care Teams include your primary Cardiologist (physician) and Advanced Practice Providers (APPs -  Physician Assistants and Nurse Practitioners) who all work together to provide you with the care you need, when you need it.  We recommend signing up for the patient portal called "MyChart".  Sign up information is provided on this After Visit Summary.  MyChart is used to connect with patients for Virtual Visits (Telemedicine).  Patients are able to view lab/test results, encounter notes, upcoming appointments, etc.  Non-urgent messages can be sent to your provider as well.   To learn more about what you can do with MyChart, go to [email or web address].    Your next appointment:   12 month(s)  The format for your next appointment:   In Person  Provider:   Jyl Heinz, MD   Other Instructions NA

## 2020-09-23 NOTE — Progress Notes (Signed)
Cardiology Office Note:    Date:  09/23/2020   ID:  Brandi Erickson, DOB Dec 04, 1970, MRN [id number]  PCP:  Unk Pinto, MD  Cardiologist:  Jenean Lindau, MD   Referring MD: Unk Pinto, MD    ASSESSMENT:    1. Mixed hyperlipidemia   2. Chest pain, unspecified type    PLAN:    In order of problems listed above:  Primary prevention stressed with the patient.  Importance of compliance with diet medication stressed and she vocalized understanding.  She was advised to walk at least half an hour as possible on a daily basis and she promises to do so. Mixed dyslipidemia: Diet was emphasized.  Her calcium score is 0 so diet and exercise should be good in lowering lipids.  I told her to be careful with her food choices and she promises to do so. Patient will be seen in follow-up appointment in 6 months or earlier if the patient has any concerns    Medication Adjustments/Labs and Tests Ordered: Current medicines are reviewed at length with the patient today.  Concerns regarding medicines are outlined above.  Orders Placed This Encounter  Procedures   EKG 12-Lead   No orders of the defined types were placed in this encounter.    No chief complaint on file.    History of Present Illness:    Brandi Erickson is a 50 y.o. female.  Patient has past medical history of mixed dyslipidemia and Charcot Lelan Pons tooth disease.  She denies any problems at this time and takes care of activities of daily living.  She walks some on a regular basis.  She has pain issues with her CMT disease.  At the time of my evaluation, the patient is alert awake oriented and in no distress.  She occasionally has chest discomfort and is not related to exertion.  It is sharp in nature and happens only once in a couple of months.  At the time of my evaluation, the patient is alert awake oriented and in no distress.  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   B12 deficiency 06/13/2020   Bilateral carpal tunnel syndrome 11/22/2019   Carpal tunnel syndrome of right wrist 01/21/2020   Chronic allergic conjunctivitis 07/23/2020   Chronic constipation    Chronic use of opiate drug for therapeutic purpose 08/15/2018   CMT (Charcot-Marie-Tooth disease)    Constipation 08/14/2012   Esophageal dysphagia 06/25/2011   GERD (gastroesophageal reflux disease)    Medication management 09/11/2014   Mild persistent asthma, uncomplicated 5/46/5035   Mixed hyperlipidemia 09/13/2014   Neuropathy, peripheral    upper and lower extremities seconardy to scharcot-marie  tooth disease   Other fatigue 09/11/2014   Pain in joint, multiple sites 09/11/2014   Pain in left foot 11/21/2017   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 Geraldo Pitter in cardiology, had normal stress test 06/20/2019.    Tinnitus of both ears 07/05/2017   TMJ (temporomandibular joint syndrome) 08/07/2018   Uterine polyp    Vitamin D deficiency  09/11/2014   Wears glasses     Past Surgical History:  Procedure Laterality Date   COLONOSCOPY N/A 11/01/2012   Procedure: COLONOSCOPY;  Surgeon: Rogene Houston, MD;  [facility];  Service: Endoscopy;  Laterality: N/A;  [place] MANOMETRY  05/17/2011   Procedure: ESOPHAGEAL MANOMETRY (EM);  Surgeon: Beryle Beams, MD;  [facility];  Service: Endoscopy;  Laterality: N/A;   FOOT NEUROMA SURGERY Right 06-05-2013   HYSTEROSCOPY WITH D & C N/A 06/27/2013   Procedure: HYSTEROSCOPY POLPYECTOMY  ;  Surgeon: Governor Specking, MD;  Location: [place];  Service: Gynecology;  Laterality: N/A;   LAPAROSCOPIC TOTAL HYSTERECTOMY  2018   [facility]   LAPAROSCOPY N/A 06/27/2013   Procedure: LAPAROSCOPY WITH extensive lysis of adhesions;  Surgeon: Governor Specking, MD;  Location: [place];  Service: Gynecology;  Laterality: N/A;   TONSILLECTOMY     TONSILLECTOMY  as child   TUBAL LIGATION  1999   tubal reconstruction  1997    Current Medications: Current Meds  Medication Sig   albuterol (PROAIR HFA) 108 (90 Base) MCG/ACT inhaler Inhale 2 puffs into the lungs every 6 (six) hours as needed for wheezing or shortness of breath.   aspirin EC 81 MG tablet Take 81 mg by mouth daily.   BREO ELLIPTA 200-25 MCG/INH AEPB Inhale 1 puff into the lungs daily.   cetirizine (ZYRTEC) 10 MG tablet Take 10 mg by mouth every evening.   Cholecalciferol (VITAMIN D) 125 MCG (5000 UT) CAPS Take 1 capsule by mouth. Takes 5,000 units every 2 to 3 days.   EPINEPHrine 0.3 mg/0.3 mL IJ SOAJ injection Inject 0.3 mg into the skin as needed for anaphylaxis.   fluticasone (CUTIVATE) 0.05 % cream Apply 1 application topically 2 (two) times daily.    fluticasone (FLONASE) 50 MCG/ACT nasal spray Place 1-2 sprays into both nostrils daily.   gabapentin (NEURONTIN) 800 MG tablet Take 800 mg by mouth in the morning, at noon, in the evening, and at bedtime.   methocarbamol (ROBAXIN) 500 MG tablet Take 1 tablet (500 mg total) by mouth 3 (three) times daily as needed for muscle spasms.   montelukast (SINGULAIR) 10 MG tablet Take 1 tablet (10 mg total) by mouth at bedtime.   MOTEGRITY 2 MG TABS Take 1 tablet by mouth daily.   pantoprazole (PROTONIX) 40 MG tablet Take 1 tablet (40 mg total) by mouth 2 (two) times daily before a meal.   polyethylene glycol (MIRALAX / GLYCOLAX) 17 g packet Take 17 g by mouth daily.   tretinoin (RETIN-A) 0.05 % cream Apply 1 application topically at bedtime.   venlafaxine XR (EFFEXOR XR) 37.5 MG 24 hr capsule Take 1 capsule  (37.5 mg total) by mouth daily with breakfast.     Allergies:   Peanut-containing drug products, Linaclotide, Iodinated diagnostic agents, Methocarbamol, Oxycodone hcl, Penicillins, Shellfish allergy, Sulfa antibiotics, Topamax [topiramate], and Lactose   Social History   Socioeconomic History   Marital status: Single    Spouse name: Not on file   Number of children: Not on file   Years of education: Not on file   Highest education level: Not on file  Occupational History   Not on file  Tobacco Use   Smoking status: Never   Smokeless tobacco: Never  Vaping Use   Vaping Use: Never used  Substance and Sexual Activity   Alcohol use: No  Drug use: No   Sexual activity: Yes  Other Topics Concern   Not on file  Social History Narrative   ** Merged History Encounter **       Social Determinants of Health   Financial Resource Strain: Not on file  Food Insecurity: Not on file  Transportation Needs: Not on file  Physical Activity: Not on file  Stress: Not on file  Social Connections: Not on file     Family History: The patient's family history includes Brain cancer in her sister; Breast cancer in her sister.  ROS:   Please see the history of present illness.    All other systems reviewed and are negative.  EKGs/Labs/Other Studies Reviewed:    The following studies were reviewed today: EKG reveals sinus rhythm and nonspecific ST-T changes   Recent Labs: 06/12/2020: ALT 25; BUN 12; Creat 0.76; Hemoglobin 13.8; Magnesium 2.1; Platelets 405; Potassium 4.4; Sodium 137; TSH 1.71  Recent Lipid Panel    Component Value Date/Time   CHOL 211 (H) 06/12/2020 1540   TRIG 51 06/12/2020 1540   HDL 63 06/12/2020 1540   CHOLHDL 3.3 06/12/2020 1540   VLDL 16 09/16/2016 0919   LDLCALC 134 (H) 06/12/2020 1540    Physical Exam:    VS:  BP 118/66   Pulse 60   Ht 5\' 6"  (1.676 m)   Wt 159 lb (72.1 kg)   LMP 02/13/2016   SpO2 99%   BMI 25.66 kg/m     Wt Readings from Last 3  Encounters:  09/23/20 159 lb (72.1 kg)  06/12/20 159 lb (72.1 kg)  12/12/19 154 lb (69.9 kg)     GEN: Patient is in no acute distress HEENT: Normal NECK: No JVD; No carotid bruits LYMPHATICS: No lymphadenopathy CARDIAC: Hear sounds regular, 2/6 systolic murmur at the apex. RESPIRATORY:  Clear to auscultation without rales, wheezing or rhonchi  ABDOMEN: Soft, non-tender, non-distended MUSCULOSKELETAL:  No edema; No deformity  SKIN: Warm and dry NEUROLOGIC:  Alert and oriented x 3 PSYCHIATRIC:  Normal affect   Signed, Jenean Lindau, MD  09/23/2020 1:44 PM    [facility]

## 2020-09-24 DIAGNOSIS — M25521 Pain in right elbow: Secondary | ICD-10-CM | POA: Diagnosis not present

## 2020-09-24 DIAGNOSIS — J3081 Allergic rhinitis due to animal (cat) (dog) hair and dander: Secondary | ICD-10-CM | POA: Diagnosis not present

## 2020-09-24 DIAGNOSIS — J3089 Other allergic rhinitis: Secondary | ICD-10-CM | POA: Diagnosis not present

## 2020-09-24 DIAGNOSIS — J301 Allergic rhinitis due to pollen: Secondary | ICD-10-CM | POA: Diagnosis not present

## 2020-09-26 ENCOUNTER — Ambulatory Visit: Payer: Medicare Other

## 2020-10-06 DIAGNOSIS — K639 Disease of intestine, unspecified: Secondary | ICD-10-CM | POA: Diagnosis not present

## 2020-10-06 DIAGNOSIS — R140 Abdominal distension (gaseous): Secondary | ICD-10-CM | POA: Diagnosis not present

## 2020-10-06 DIAGNOSIS — K5900 Constipation, unspecified: Secondary | ICD-10-CM | POA: Diagnosis not present

## 2020-10-06 DIAGNOSIS — B9689 Other specified bacterial agents as the cause of diseases classified elsewhere: Secondary | ICD-10-CM | POA: Diagnosis not present

## 2020-10-06 DIAGNOSIS — R142 Eructation: Secondary | ICD-10-CM | POA: Diagnosis not present

## 2020-10-06 DIAGNOSIS — R110 Nausea: Secondary | ICD-10-CM | POA: Diagnosis not present

## 2020-10-06 DIAGNOSIS — R12 Heartburn: Secondary | ICD-10-CM | POA: Diagnosis not present

## 2020-10-06 DIAGNOSIS — R1084 Generalized abdominal pain: Secondary | ICD-10-CM | POA: Diagnosis not present

## 2020-10-10 DIAGNOSIS — J3081 Allergic rhinitis due to animal (cat) (dog) hair and dander: Secondary | ICD-10-CM | POA: Diagnosis not present

## 2020-10-10 DIAGNOSIS — J3089 Other allergic rhinitis: Secondary | ICD-10-CM | POA: Diagnosis not present

## 2020-10-10 DIAGNOSIS — J301 Allergic rhinitis due to pollen: Secondary | ICD-10-CM | POA: Diagnosis not present

## 2020-10-14 DIAGNOSIS — J3089 Other allergic rhinitis: Secondary | ICD-10-CM | POA: Diagnosis not present

## 2020-10-14 DIAGNOSIS — J3081 Allergic rhinitis due to animal (cat) (dog) hair and dander: Secondary | ICD-10-CM | POA: Diagnosis not present

## 2020-10-14 DIAGNOSIS — J301 Allergic rhinitis due to pollen: Secondary | ICD-10-CM | POA: Diagnosis not present

## 2020-10-17 DIAGNOSIS — J3089 Other allergic rhinitis: Secondary | ICD-10-CM | POA: Diagnosis not present

## 2020-10-17 DIAGNOSIS — J301 Allergic rhinitis due to pollen: Secondary | ICD-10-CM | POA: Diagnosis not present

## 2020-10-17 DIAGNOSIS — J3081 Allergic rhinitis due to animal (cat) (dog) hair and dander: Secondary | ICD-10-CM | POA: Diagnosis not present

## 2020-10-20 DIAGNOSIS — J301 Allergic rhinitis due to pollen: Secondary | ICD-10-CM | POA: Diagnosis not present

## 2020-10-20 DIAGNOSIS — J3081 Allergic rhinitis due to animal (cat) (dog) hair and dander: Secondary | ICD-10-CM | POA: Diagnosis not present

## 2020-10-20 DIAGNOSIS — J3089 Other allergic rhinitis: Secondary | ICD-10-CM | POA: Diagnosis not present

## 2020-10-24 DIAGNOSIS — J3089 Other allergic rhinitis: Secondary | ICD-10-CM | POA: Diagnosis not present

## 2020-10-24 DIAGNOSIS — J301 Allergic rhinitis due to pollen: Secondary | ICD-10-CM | POA: Diagnosis not present

## 2020-10-24 DIAGNOSIS — J3081 Allergic rhinitis due to animal (cat) (dog) hair and dander: Secondary | ICD-10-CM | POA: Diagnosis not present

## 2020-10-28 DIAGNOSIS — J3081 Allergic rhinitis due to animal (cat) (dog) hair and dander: Secondary | ICD-10-CM | POA: Diagnosis not present

## 2020-10-28 DIAGNOSIS — J301 Allergic rhinitis due to pollen: Secondary | ICD-10-CM | POA: Diagnosis not present

## 2020-10-28 DIAGNOSIS — J3089 Other allergic rhinitis: Secondary | ICD-10-CM | POA: Diagnosis not present

## 2020-11-04 DIAGNOSIS — J3081 Allergic rhinitis due to animal (cat) (dog) hair and dander: Secondary | ICD-10-CM | POA: Diagnosis not present

## 2020-11-04 DIAGNOSIS — J301 Allergic rhinitis due to pollen: Secondary | ICD-10-CM | POA: Diagnosis not present

## 2020-11-04 DIAGNOSIS — J3089 Other allergic rhinitis: Secondary | ICD-10-CM | POA: Diagnosis not present

## 2020-11-11 DIAGNOSIS — J301 Allergic rhinitis due to pollen: Secondary | ICD-10-CM | POA: Diagnosis not present

## 2020-11-11 DIAGNOSIS — J3081 Allergic rhinitis due to animal (cat) (dog) hair and dander: Secondary | ICD-10-CM | POA: Diagnosis not present

## 2020-11-11 DIAGNOSIS — J3089 Other allergic rhinitis: Secondary | ICD-10-CM | POA: Diagnosis not present

## 2020-11-18 DIAGNOSIS — J3081 Allergic rhinitis due to animal (cat) (dog) hair and dander: Secondary | ICD-10-CM | POA: Diagnosis not present

## 2020-11-18 DIAGNOSIS — J301 Allergic rhinitis due to pollen: Secondary | ICD-10-CM | POA: Diagnosis not present

## 2020-11-18 DIAGNOSIS — J3089 Other allergic rhinitis: Secondary | ICD-10-CM | POA: Diagnosis not present

## 2020-11-24 DIAGNOSIS — J3089 Other allergic rhinitis: Secondary | ICD-10-CM | POA: Diagnosis not present

## 2020-11-24 DIAGNOSIS — J301 Allergic rhinitis due to pollen: Secondary | ICD-10-CM | POA: Diagnosis not present

## 2020-11-24 DIAGNOSIS — J3081 Allergic rhinitis due to animal (cat) (dog) hair and dander: Secondary | ICD-10-CM | POA: Diagnosis not present

## 2020-11-26 DIAGNOSIS — J3081 Allergic rhinitis due to animal (cat) (dog) hair and dander: Secondary | ICD-10-CM | POA: Diagnosis not present

## 2020-11-26 DIAGNOSIS — J3089 Other allergic rhinitis: Secondary | ICD-10-CM | POA: Diagnosis not present

## 2020-11-26 DIAGNOSIS — J301 Allergic rhinitis due to pollen: Secondary | ICD-10-CM | POA: Diagnosis not present

## 2020-12-03 DIAGNOSIS — J3089 Other allergic rhinitis: Secondary | ICD-10-CM | POA: Diagnosis not present

## 2020-12-03 DIAGNOSIS — J301 Allergic rhinitis due to pollen: Secondary | ICD-10-CM | POA: Diagnosis not present

## 2020-12-03 DIAGNOSIS — J3081 Allergic rhinitis due to animal (cat) (dog) hair and dander: Secondary | ICD-10-CM | POA: Diagnosis not present

## 2020-12-09 DIAGNOSIS — J301 Allergic rhinitis due to pollen: Secondary | ICD-10-CM | POA: Diagnosis not present

## 2020-12-09 DIAGNOSIS — J3081 Allergic rhinitis due to animal (cat) (dog) hair and dander: Secondary | ICD-10-CM | POA: Diagnosis not present

## 2020-12-09 DIAGNOSIS — J3089 Other allergic rhinitis: Secondary | ICD-10-CM | POA: Diagnosis not present

## 2020-12-11 DIAGNOSIS — J301 Allergic rhinitis due to pollen: Secondary | ICD-10-CM | POA: Diagnosis not present

## 2020-12-11 DIAGNOSIS — J3081 Allergic rhinitis due to animal (cat) (dog) hair and dander: Secondary | ICD-10-CM | POA: Diagnosis not present

## 2020-12-11 DIAGNOSIS — J3089 Other allergic rhinitis: Secondary | ICD-10-CM | POA: Diagnosis not present

## 2020-12-16 ENCOUNTER — Encounter: Payer: Medicare Other | Admitting: Internal Medicine

## 2020-12-17 ENCOUNTER — Other Ambulatory Visit: Payer: Self-pay

## 2020-12-17 ENCOUNTER — Encounter: Payer: Self-pay | Admitting: Internal Medicine

## 2020-12-17 ENCOUNTER — Ambulatory Visit (INDEPENDENT_AMBULATORY_CARE_PROVIDER_SITE_OTHER): Payer: Medicare Other | Admitting: Internal Medicine

## 2020-12-17 VITALS — BP 126/88 | HR 67 | Temp 97.1°F | Resp 17 | Ht 66.0 in | Wt 159.0 lb

## 2020-12-17 DIAGNOSIS — R7309 Other abnormal glucose: Secondary | ICD-10-CM

## 2020-12-17 DIAGNOSIS — J3081 Allergic rhinitis due to animal (cat) (dog) hair and dander: Secondary | ICD-10-CM | POA: Diagnosis not present

## 2020-12-17 DIAGNOSIS — K219 Gastro-esophageal reflux disease without esophagitis: Secondary | ICD-10-CM | POA: Diagnosis not present

## 2020-12-17 DIAGNOSIS — Z0001 Encounter for general adult medical examination with abnormal findings: Secondary | ICD-10-CM

## 2020-12-17 DIAGNOSIS — Z8249 Family history of ischemic heart disease and other diseases of the circulatory system: Secondary | ICD-10-CM

## 2020-12-17 DIAGNOSIS — E559 Vitamin D deficiency, unspecified: Secondary | ICD-10-CM | POA: Diagnosis not present

## 2020-12-17 DIAGNOSIS — J3089 Other allergic rhinitis: Secondary | ICD-10-CM | POA: Diagnosis not present

## 2020-12-17 DIAGNOSIS — E782 Mixed hyperlipidemia: Secondary | ICD-10-CM | POA: Diagnosis not present

## 2020-12-17 DIAGNOSIS — G894 Chronic pain syndrome: Secondary | ICD-10-CM

## 2020-12-17 DIAGNOSIS — R0989 Other specified symptoms and signs involving the circulatory and respiratory systems: Secondary | ICD-10-CM | POA: Diagnosis not present

## 2020-12-17 DIAGNOSIS — J301 Allergic rhinitis due to pollen: Secondary | ICD-10-CM | POA: Diagnosis not present

## 2020-12-17 DIAGNOSIS — Z136 Encounter for screening for cardiovascular disorders: Secondary | ICD-10-CM | POA: Diagnosis not present

## 2020-12-17 DIAGNOSIS — Z0000 Encounter for general adult medical examination without abnormal findings: Secondary | ICD-10-CM | POA: Diagnosis not present

## 2020-12-17 DIAGNOSIS — Z1211 Encounter for screening for malignant neoplasm of colon: Secondary | ICD-10-CM

## 2020-12-17 DIAGNOSIS — G600 Hereditary motor and sensory neuropathy: Secondary | ICD-10-CM

## 2020-12-17 DIAGNOSIS — Z79899 Other long term (current) drug therapy: Secondary | ICD-10-CM

## 2020-12-17 NOTE — Progress Notes (Signed)
Annual Screening/Preventative Visit & Comprehensive Evaluation &  Examination  Future Appointments  Date Time Provider [place]  12/17/2020  3:00 PM Unk Pinto, MD [facility] None  12/17/2021  3:00 PM Unk Pinto, MD [facility] None        This very nice 50 y.o. DBF presents for a Screening /Preventative Visit & comprehensive evaluation and management of multiple medical co-morbidities.  Patient has been followed for HTN, HLD, Prediabetes  and Vitamin D Deficiency. Patient has hx/o GERD controlled w/diet & her meds.                                                      Patient has been on SS Disability x 4 years since age 40 yo  (2000) for Charcot Lelan Pons Tooth Disease  (CMT) w/Chronic Pain Syndrome in the lower extremities and is followed at [facility] on Gabapentin &  patient has been off Opioid Narcotics  since Dec 2021.         Patient has hx/o labile HTN followed expectantly.  In March 2021, she had a negative ETT.    08/26/2020 CT Cardiac Calcium Score  of "0". Patient's BP has been controlled at home and patient denies any cardiac symptoms as chest pain, palpitations, shortness of breath, dizziness or ankle swelling. Today's BP: 126/88        Patient's hyperlipidemia is not controlled with diet. Last lipids were not at goal:  Lab Results  Component Value Date   CHOL 211 (H) 06/12/2020   HDL 63 06/12/2020   LDLCALC 134 (H) 06/12/2020   TRIG 51 06/12/2020   CHOLHDL 3.3 06/12/2020         Patient has hx/o prediabetes (A1c 5.7% /2016) and patient denies reactive hypoglycemic symptoms, visual blurring, diabetic polys or paresthesias. Last A1c was   Lab Results  Component Value Date   HGBA1C 5.5 12/12/2019         Finally, patient has history of Vitamin D Deficiency ("25" /2016) and last Vitamin D was still low:  Lab Results  Component Value Date   VD25OH 42 06/12/2020     Current Outpatient Medications on File Prior to Visit  Medication  Sig   albuterol HFA inhaler Inhale 2 puffs  every 6 hours as needed    aspirin EC 81 MG tablet Take daily.   BREO ELLIPTA 200-25  Inhale 1 puff daily.   cetirizine 10 MG tablet Take every evening.   VITAMIN D  5,000 u Takes every 2 to 3 days.   EPINEPHrine njection Inject 0.3 mg as needed for anaphylaxis.   fluticasone (CUTIVATE) 0.05 % cream Apply 2 times daily.    FLONASE nasal spray Place 1-2 sprays into both nostrils daily.   gabapentin  800 MG tablet Take 800 mg morning,noon, evening &  bedtime.   methocarbamol 500 MG tablet Take 1 tablet 3 times daily as needed for muscle spasms.   montelukast 10 MG tablet Take 1 tablet  at bedtime.   MOTEGRITY 2 MG TABS Take 1 tablet daily.   pantoprazole  40 MG tablet Take 1 tablet 2  times daily before a meal.   polyethylene glycol 17 g packet Take  daily.   tretinoin (RETIN-A) 0.05 % cream Apply topically at bedtime.   venlafaxine XR 37.5 MG 24 hr capsule Take 1 capsule  daily with breakfast.    Allergies  Allergen Reactions   Peanut-Containing Drug Products Other (See Comments)    Patient states that her throat swells.   Linaclotide Rash    Other reaction(s): hives   Iodinated Diagnostic Agents Nausea And Vomiting    MRI dye  nausea   Methocarbamol     Other reaction(s): itching   Oxycodone  Nausea And Vomiting    Other reaction(s): itching   Penicillins Hives   Shellfish Allergy Other (See Comments)   Sulfa Antibiotics Hives   Topamax [Topiramate] Hives   Lactose Rash     Past Medical History:  Diagnosis Date   Abnormal glucose 09/13/2014   Adverse reaction to food 07/23/2020   Allergic rhinitis 07/23/2020   Allergic rhinitis due to animal (cat) (dog) hair and dander 07/23/2020   Allergic rhinitis due to pollen 07/23/2020   Allergy    Anxiety 09/14/2017   Asthma    Asthma, chronic 08/14/2012   Asymmetric SNHL (sensorineural hearing loss) 07/05/2017   B12 deficiency 06/13/2020   Bilateral carpal tunnel syndrome 11/22/2019   Carpal  tunnel syndrome of right wrist 01/21/2020   Chronic allergic conjunctivitis 07/23/2020   Chronic constipation    Chronic use of opiate drug for therapeutic purpose 08/15/2018   CMT (Charcot-Marie-Tooth disease)    Constipation 08/14/2012   Esophageal dysphagia 06/25/2011   GERD (gastroesophageal reflux disease)    Medication management 09/11/2014   Mild persistent asthma, uncomplicated 8/46/9629   Mixed hyperlipidemia 09/13/2014   Neuropathy, peripheral    upper and lower extremities seconardy to scharcot-marie  tooth disease   Other fatigue 09/11/2014   Pain in joint, multiple sites 09/11/2014   Pain in left foot 11/21/2017   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 Geraldo Pitter in cardiology, had normal stress test 06/20/2019.    Tinnitus of both ears 07/05/2017   TMJ (temporomandibular joint syndrome) 08/07/2018   Uterine polyp    Vitamin D deficiency 09/11/2014   Wears glasses      Health Maintenance  Topic Date Due   HIV Screening  Never done   Zoster Vaccines- Shingrix (1 of 2) Never done   PAP SMEAR-Modifier  09/13/2016   COVID-19 Vaccine (5 - Booster for Pfizer series) 10/03/2020   INFLUENZA VACCINE  Never done   MAMMOGRAM  08/28/2021   COLONOSCOPY  11/02/2022   TETANUS/TDAP  09/17/2026   Hepatitis C Screening  Completed   HPV VACCINES  Aged Out    Immunization History  Administered Date(s) Administered   PFIZER SARS-COV-2 Vacc  09/02/2019, 09/23/2019, 04/01/2020   PPD Test 08/26/2015, 09/16/2016   Tdap 09/16/2016    Last Colon - 11/01/2012 - Dr Laural Golden - [facility]    Last MGM - 09/01/2020   Past Surgical History:  Procedure Laterality Date   COLONOSCOPY N/A 11/01/2012   Procedure: COLONOSCOPY;  Surgeon: Rogene Houston, MD;  [facility];  Service: Endoscopy;  Laterality: N/A;  [place] MANOMETRY  05/17/2011   Procedure:  ESOPHAGEAL MANOMETRY (EM);  Surgeon: Beryle Beams, MD;  [facility];  Service: Endoscopy;  Laterality: N/A;   FOOT NEUROMA SURGERY Right 06-05-2013   HYSTEROSCOPY WITH D & C N/A 06/27/2013   Procedure: HYSTEROSCOPY POLPYECTOMY  ;  Surgeon: Governor Specking, MD;  Location: [place];  Service: Gynecology;  Laterality: N/A;   LAPAROSCOPIC TOTAL HYSTERECTOMY  2018   [facility]   LAPAROSCOPY N/A 06/27/2013   Procedure: LAPAROSCOPY WITH extensive lysis of adhesions;  Surgeon: Governor Specking, MD;  Location: [place];  Service: Gynecology;  Laterality: N/A;   TONSILLECTOMY     TONSILLECTOMY  as child   TUBAL LIGATION  1999   tubal reconstruction  1997    Family History  Problem Relation Age of Onset   Breast cancer Sister    Brain cancer Sister      Social History   Tobacco Use   Smoking status: Never   Smokeless tobacco: Never  Vaping Use   Vaping Use: Never used  Substance Use Topics   Alcohol use: No   Drug use: No     ROS Constitutional: Denies fever, chills, weight loss/gain, headaches, insomnia,  night sweats, and change in appetite. Does c/o fatigue. Eyes: Denies redness, blurred vision, diplopia, discharge, itchy, watery eyes.  ENT: Denies discharge, congestion, post nasal drip, epistaxis, sore throat, earache, hearing loss, dental pain, Tinnitus, Vertigo, Sinus pain, snoring.  Cardio: Denies chest pain, palpitations, irregular heartbeat, syncope, dyspnea, diaphoresis, orthopnea, PND, claudication, edema Respiratory: denies cough, dyspnea, DOE, pleurisy, hoarseness, laryngitis, wheezing.  Gastrointestinal: Denies dysphagia, heartburn, reflux, water brash, pain, cramps, nausea, vomiting, bloating, diarrhea, constipation, hematemesis, melena, hematochezia, jaundice, hemorrhoids Genitourinary: Denies dysuria, frequency, urgency, nocturia, hesitancy, discharge, hematuria, flank pain Breast: Breast lumps, nipple discharge, bleeding.   Musculoskeletal: Denies arthralgia, myalgia, stiffness, Jt. Swelling, pain, limp, and strain/sprain. Denies falls. Skin: Denies puritis, rash, hives, warts, acne, eczema, changing in skin lesion Neuro: No weakness, tremor, incoordination, spasms, paresthesia, pain Psychiatric: Denies confusion, memory loss, sensory loss. Denies Depression. Endocrine: Denies change in weight, skin, hair change, nocturia, and paresthesia, diabetic polys, visual blurring, hyper / hypo glycemic episodes.  Heme/Lymph: No excessive bleeding, bruising, enlarged lymph nodes.  Physical Exam  BP 126/88   Pulse 67   Temp (!) 97.1 F (36.2 C)   Resp 17   Ht 5\' 6"  (1.676 m)   Wt 159 lb (72.1 kg)   LMP 02/13/2016   SpO2 98%   BMI 25.66 kg/m   General Appearance: Well nourished, well groomed and in no apparent distress.  Eyes: PERRLA, EOMs, conjunctiva no swelling or erythema, normal fundi and vessels. Sinuses: No frontal/maxillary tenderness ENT/Mouth: EACs patent / TMs  nl. Nares clear without erythema, swelling, mucoid exudates. Oral hygiene is good. No erythema, swelling, or exudate. Tongue normal, non-obstructing. Tonsils not swollen or erythematous. Hearing normal.  Neck: Supple, thyroid not palpable. No bruits, nodes or JVD. Respiratory: Respiratory effort normal.  BS equal and clear bilateral without rales, rhonci, wheezing or stridor. Cardio: Heart sounds are normal with regular rate and rhythm and no murmurs, rubs or gallops. Peripheral pulses are normal and equal bilaterally without edema. No aortic or femoral bruits. Chest: symmetric with normal excursions and percussion. Breasts: Symmetric, without lumps, nipple discharge, retractions, or fibrocystic changes.  Abdomen: Flat, soft with bowel sounds active. Nontender, no guarding, rebound, hernias, masses, or organomegaly.  Lymphatics: Non tender without lymphadenopathy.  Genitourinary:  Musculoskeletal: Full ROM all peripheral extremities, joint  stability, 5/5 strength, and normal gait. Skin: Warm and dry without rashes, lesions, cyanosis, clubbing or  ecchymosis.  Neuro: Cranial nerves intact, reflexes equal bilaterally. Normal muscle tone, no cerebellar symptoms. Sensation intact.  Pysch: Alert and oriented X 3, normal affect, Insight and Judgment appropriate.    Assessment and Plan  1. Annual Preventative Screening Examination  2. Labile hypertension  - EKG 12-Lead - Urinalysis, Routine w reflex microscopic - Microalbumin / creatinine urine ratio - Magnesium - TSH  3. Hyperlipidemia, mixed  - EKG 12-Lead - Lipid panel - TSH  4. Abnormal glucose  - EKG 12-Lead - Hemoglobin A1c - Insulin, random  5. Vitamin D deficiency  - VITAMIN D 25 Hydroxy   6. Gastroesophageal reflux disease, unspecified whether esophagitis present  - CBC with Differential/Platelet  7. CMT (Charcot-Marie-Tooth disease)   8. Chronic pain syndrome   9. Screening for ischemic heart disease  - EKG 12-Lead  10. FHx: heart disease  - EKG 12-Lead  11. Colon cancer screening  - POC Hemoccult Bld/Stl   12. Medication management  - Urinalysis, Routine w reflex microscopic - Microalbumin / creatinine urine ratio - CBC with Differential/Platelet - COMPLETE METABOLIC PANEL WITH GFR - Lipid panel - TSH - Hemoglobin A1c - Insulin, random - VITAMIN D 25 Hydroxy           Patient was counseled in prudent diet to achieve/maintain BMI less than 25 for weight control, BP monitoring, regular exercise and medications. Discussed med's effects and SE's. Screening labs and tests as requested with regular follow-up as recommended. Over 40 minutes of exam, counseling, chart review and high complex critical decision making was performed.   Kirtland Bouchard, MD

## 2020-12-17 NOTE — Patient Instructions (Signed)
Due to recent changes in healthcare laws, you may see the results of your imaging and laboratory studies on MyChart before your provider has had a chance to review them.  We understand that in some cases there may be results that are confusing or concerning to you. Not all laboratory results come back in the same time frame and the provider may be waiting for multiple results in order to interpret others.  Please give [place] 48 hours in order for your provider to thoroughly review all the results before contacting the office for clarification of your results.   ++++++++++++++++++++++++++++++  Vit D  & Vit C 1,000 mg   are recommended to help protect  against the Covid-19 and other Corona viruses.    Also it's recommended  to take  Zinc 50 mg  to help  protect against the Covid-19   and best place to get  is also on [email or web address]  and don't pay more than 6-8 cents /pill !  ================================ Coronavirus (COVID-19) Are you at risk?  Are you at risk for the Coronavirus (COVID-19)?  To be considered HIGH RISK for Coronavirus (COVID-19), you have to meet the following criteria:  Traveled to [place], [place], [place], [place] or [place]; or in the [place] to [place], [place], [place]  or [place]; and have fever, cough, and shortness of breath within the last 2 weeks of travel OR Been in close contact with a person diagnosed with COVID-19 within the last 2 weeks and have  fever, cough,and shortness of breath  IF YOU DO NOT MEET THESE CRITERIA, YOU ARE CONSIDERED LOW RISK FOR COVID-19.  What to do if you are HIGH RISK for COVID-19?  If you are having a medical emergency, call 911. Seek medical care right away. Before you go to a doctor's office, urgent care or emergency department,  call ahead and tell them about your recent travel, contact with someone diagnosed with COVID-19   and your symptoms.  You should receive instructions from your physician's office regarding  next steps of care.  When you arrive at healthcare provider, tell the healthcare staff immediately you have returned from  visiting [place], [place], [place], [place] or [place]; or traveled in the [place] to [place], [place],  [place] or [place] in the last two weeks or you have been in close contact with a person diagnosed with  COVID-19 in the last 2 weeks.   Tell the health care staff about your symptoms: fever, cough and shortness of breath. After you have been seen by a medical provider, you will be either: Tested for (COVID-19) and discharged home on quarantine except to seek medical care if  symptoms worsen, and asked to  Stay home and avoid contact with others until you get your results (4-5 days)  Avoid travel on public transportation if possible (such as bus, train, or airplane) or Sent to the Emergency Department by EMS for evaluation, COVID-19 testing  and  possible admission depending on your condition and test results.  What to do if you are LOW RISK for COVID-19?  Reduce your risk of any infection by using the same precautions used for avoiding the common cold or flu:  Wash your hands often with soap and warm water for at least 20 seconds.  If soap and water are not readily available,  use an alcohol-based hand sanitizer with at least 60% alcohol.  If coughing or sneezing, cover your mouth and nose by coughing  or sneezing into the elbow areas of your shirt or coat,  into a tissue or into your sleeve (not your hands). Avoid shaking hands with others and consider head nods or verbal greetings only. Avoid touching your eyes, nose, or mouth with unwashed hands.  Avoid close contact with people who are sick. Avoid places or events with large numbers of people in one location, like concerts or sporting events. Carefully consider travel plans you have or are making. If you are planning any travel outside or inside the [place], visit the CDC's Travelers' Health webpage for  the latest health notices. If you have some symptoms but not all symptoms, continue to monitor at home and seek medical attention  if your symptoms worsen. If you are having a medical emergency, call 911. >>>>>>>>>>>>>>>>>>>>>>> Preventive Care for Adults  A healthy lifestyle and preventive care can promote health and wellness. Preventive health guidelines for women include the following key practices. A routine yearly physical is a good way to check with your health care provider about your health and preventive screening. It is a chance to share any concerns and updates on your health and to receive a thorough exam. Visit your dentist for a routine exam and preventive care every 6 months. Brush your teeth twice a day and floss once a day. Good oral hygiene prevents tooth decay and gum disease. The frequency of eye exams is based on your age, health, family medical history, use of contact lenses, and other factors. Follow your health care provider's recommendations for frequency of eye exams. Eat a healthy diet. Foods like vegetables, fruits, whole grains, low-fat dairy products, and lean protein foods contain the nutrients you need without too many calories. Decrease your intake of foods high in solid fats, added sugars, and salt. Eat the right amount of calories for you. Get information about a proper diet from your health care provider, if necessary. Regular physical exercise is one of the most important things you can do for your health. Most adults should get at least 150 minutes of moderate-intensity exercise (any activity that increases your heart rate and causes you to sweat) each week. In addition, most adults need muscle-strengthening exercises on 2 or more days a week. Maintain a healthy weight. The body mass index (BMI) is a screening tool to identify possible weight problems. It provides an estimate of body fat based on height and weight. Your health care provider can find your BMI and can  help you achieve or maintain a healthy weight. For adults 20 years and older: A BMI below 18.5 is considered underweight. A BMI of 18.5 to 24.9 is normal. A BMI of 25 to 29.9 is considered overweight. A BMI of 30 and above is considered obese. Maintain normal blood lipids and cholesterol levels by exercising and minimizing your intake of saturated fat. Eat a balanced diet with plenty of fruit and vegetables. Blood tests for lipids and cholesterol should begin at age 43 and be repeated every 5 years. If your lipid or cholesterol levels are high, you are over 50, or you are at high risk for heart disease, you may need your cholesterol levels checked more frequently. Ongoing high lipid and cholesterol levels should be treated with medicines if diet and exercise are not working. If you smoke, find out from your health care provider how to quit. If you do not use tobacco, do not start. Lung cancer screening is recommended for adults aged 11-80 years who are at high risk for developing  lung cancer because of a history of smoking. A yearly low-dose CT scan of the lungs is recommended for people who have at least a 30-pack-year history of smoking and are a current smoker or have quit within the past 15 years. A pack year of smoking is smoking an average of 1 pack of cigarettes a day for 1 year (for example: 1 pack a day for 30 years or 2 packs a day for 15 years). Yearly screening should continue until the smoker has stopped smoking for at least 15 years. Yearly screening should be stopped for people who develop a health problem that would prevent them from having lung cancer treatment. High blood pressure causes heart disease and increases the risk of stroke. Your blood pressure should be checked at least every 1 to 2 years. Ongoing high blood pressure should be treated with medicines if weight loss and exercise do not work. If you are 32-3 years old, ask your health care provider if you should take aspirin to  prevent strokes. Diabetes screening involves taking a blood sample to check your fasting blood sugar level. This should be done once every 3 years, after age 32, if you are within normal weight and without risk factors for diabetes. Testing should be considered at a younger age or be carried out more frequently if you are overweight and have at least 1 risk factor for diabetes. Breast cancer screening is essential preventive care for women. You should practice "breast self-awareness." This means understanding the normal appearance and feel of your breasts and may include breast self-examination. Any changes detected, no matter how small, should be reported to a health care provider. Women in their 45s and 30s should have a clinical breast exam (CBE) by a health care provider as part of a regular health exam every 1 to 3 years. After age 83, women should have a CBE every year. Starting at age 92, women should consider having a mammogram (breast X-ray test) every year. Women who have a family history of breast cancer should talk to their health care provider about genetic screening. Women at a high risk of breast cancer should talk to their health care providers about having an MRI and a mammogram every year. Breast cancer gene (BRCA)-related cancer risk assessment is recommended for women who have family members with BRCA-related cancers. BRCA-related cancers include breast, ovarian, tubal, and peritoneal cancers. Having family members with these cancers may be associated with an increased risk for harmful changes (mutations) in the breast cancer genes BRCA1 and BRCA2. Results of the assessment will determine the need for genetic counseling and BRCA1 and BRCA2 testing. Routine pelvic exams to screen for cancer are no longer recommended for nonpregnant women who are considered low risk for cancer of the pelvic organs (ovaries, uterus, and vagina) and who do not have symptoms. Ask your health care provider if a  screening pelvic exam is right for you. If you have had past treatment for cervical cancer or a condition that could lead to cancer, you need Pap tests and screening for cancer for at least 20 years after your treatment. If Pap tests have been discontinued, your risk factors (such as having a new sexual partner) need to be reassessed to determine if screening should be resumed. Some women have medical problems that increase the chance of getting cervical cancer. In these cases, your health care provider may recommend more frequent screening and Pap tests. Colorectal cancer can be detected and often prevented. Most routine colorectal  cancer screening begins at the age of 50 years and continues through age 75 years. However, your health care provider may recommend screening at an earlier age if you have risk factors for colon cancer. On a yearly basis, your health care provider may provide home test kits to check for hidden blood in the stool. Use of a small camera at the end of a tube, to directly examine the colon (sigmoidoscopy or colonoscopy), can detect the earliest forms of colorectal cancer. Talk to your health care provider about this at age 50, when routine screening begins.  Direct exam of the colon should be repeated every 5-10 years through age 75 years, unless early forms of pre-cancerous polyps or small growths are found. Hepatitis C blood testing is recommended for all people born from 1945 through 1965 and any individual with known risks for hepatitis C. Pra Osteoporosis is a disease in which the bones lose minerals and strength with aging. This can result in serious bone fractures or breaks. The risk of osteoporosis can be identified using a bone density scan. Women ages 65 years and over and women at risk for fractures or osteoporosis should discuss screening with their health care providers. Ask your health care provider whether you should take a calcium supplement or vitamin D to reduce the  rate of osteoporosis. Menopause can be associated with physical symptoms and risks. Hormone replacement therapy is available to decrease symptoms and risks. You should talk to your health care provider about whether hormone replacement therapy is right for you. Use sunscreen. Apply sunscreen liberally and repeatedly throughout the day. You should seek shade when your shadow is shorter than you. Protect yourself by wearing long sleeves, pants, a wide-brimmed hat, and sunglasses year round, whenever you are outdoors. Once a month, do a whole body skin exam, using a mirror to look at the skin on your back. Tell your health care provider of new moles, moles that have irregular borders, moles that are larger than a pencil eraser, or moles that have changed in shape or color. Stay current with required vaccines (immunizations). Influenza vaccine. All adults should be immunized every year. Tetanus, diphtheria, and acellular pertussis (Td, Tdap) vaccine. Pregnant women should receive 1 dose of Tdap vaccine during each pregnancy. The dose should be obtained regardless of the length of time since the last dose. Immunization is preferred during the 27th-36th week of gestation. An adult who has not previously received Tdap or who does not know her vaccine status should receive 1 dose of Tdap. This initial dose should be followed by tetanus and diphtheria toxoids (Td) booster doses every 10 years. Adults with an unknown or incomplete history of completing a 3-dose immunization series with Td-containing vaccines should begin or complete a primary immunization series including a Tdap dose. Adults should receive a Td booster every 10 years. Varicella vaccine. An adult without evidence of immunity to varicella should receive 2 doses or a second dose if she has previously received 1 dose. Pregnant females who do not have evidence of immunity should receive the first dose after pregnancy. This first dose should be obtained  before leaving the health care facility. The second dose should be obtained 4-8 weeks after the first dose. Human papillomavirus (HPV) vaccine. Females aged 13-26 years who have not received the vaccine previously should obtain the 3-dose series. The vaccine is not recommended for use in pregnant females. However, pregnancy testing is not needed before receiving a dose. If a female is found to   be pregnant after receiving a dose, no treatment is needed. In that case, the remaining doses should be delayed until after the pregnancy. Immunization is recommended for any person with an immunocompromised condition through the age of 26 years if she did not get any or all doses earlier. During the 3-dose series, the second dose should be obtained 4-8 weeks after the first dose. The third dose should be obtained 24 weeks after the first dose and 16 weeks after the second dose. Zoster vaccine. One dose is recommended for adults aged 60 years or older unless certain conditions are present. Measles, mumps, and rubella (MMR) vaccine. Adults born before 1957 generally are considered immune to measles and mumps. Adults born in 1957 or later should have 1 or more doses of MMR vaccine unless there is a contraindication to the vaccine or there is laboratory evidence of immunity to each of the three diseases. A routine second dose of MMR vaccine should be obtained at least 28 days after the first dose for students attending postsecondary schools, health care workers, or international travelers. People who received inactivated measles vaccine or an unknown type of measles vaccine during 1963-1967 should receive 2 doses of MMR vaccine. People who received inactivated mumps vaccine or an unknown type of mumps vaccine before 1979 and are at high risk for mumps infection should consider immunization with 2 doses of MMR vaccine. For females of childbearing age, rubella immunity should be determined. If there is no evidence of immunity,  females who are not pregnant should be vaccinated. If there is no evidence of immunity, females who are pregnant should delay immunization until after pregnancy. Unvaccinated health care workers born before 1957 who lack laboratory evidence of measles, mumps, or rubella immunity or laboratory confirmation of disease should consider measles and mumps immunization with 2 doses of MMR vaccine or rubella immunization with 1 dose of MMR vaccine. Pneumococcal 13-valent conjugate (PCV13) vaccine. When indicated, a person who is uncertain of her immunization history and has no record of immunization should receive the PCV13 vaccine. An adult aged 19 years or older who has certain medical conditions and has not been previously immunized should receive 1 dose of PCV13 vaccine. This PCV13 should be followed with a dose of pneumococcal polysaccharide (PPSV23) vaccine. The PPSV23 vaccine dose should be obtained at least 1 or more year(s) after the dose of PCV13 vaccine. An adult aged 19 years or older who has certain medical conditions and previously received 1 or more doses of PPSV23 vaccine should receive 1 dose of PCV13. The PCV13 vaccine dose should be obtained 1 or more years after the last PPSV23 vaccine dose.  Pneumococcal polysaccharide (PPSV23) vaccine. When PCV13 is also indicated, PCV13 should be obtained first. All adults aged 65 years and older should be immunized. An adult younger than age 65 years who has certain medical conditions should be immunized. Any person who resides in a nursing home or long-term care facility should be immunized. An adult smoker should be immunized. People with an immunocompromised condition and certain other conditions should receive both PCV13 and PPSV23 vaccines. People with human immunodeficiency virus (HIV) infection should be immunized as soon as possible after diagnosis. Immunization during chemotherapy or radiation therapy should be avoided. Routine use of PPSV23 vaccine is  not recommended for American Indians, [place] Natives, or people younger than 65 years unless there are medical conditions that require PPSV23 vaccine. When indicated, people who have unknown immunization and have no record of immunization should receive   PPSV23 vaccine. One-time revaccination 5 years after the first dose of PPSV23 is recommended for people aged 19-64 years who have chronic kidney failure, nephrotic syndrome, asplenia, or immunocompromised conditions. People who received 1-2 doses of PPSV23 before age 56 years should receive another dose of PPSV23 vaccine at age 89 years or later if at least 5 years have passed since the previous dose. Doses of PPSV23 are not needed for people immunized with PPSV23 at or after age 70 years.  Preventive Services / Frequency  Ages 96 to 55 years Blood pressure check. Lipid and cholesterol check. Lung cancer screening. / Every year if you are aged 80-80 years and have a 30-pack-year history of smoking and currently smoke or have quit within the past 15 years. Yearly screening is stopped once you have quit smoking for at least 15 years or develop a health problem that would prevent you from having lung cancer treatment. Clinical breast exam.** / Every year after age 23 years.  BRCA-related cancer risk assessment.** / For women who have family members with a BRCA-related cancer (breast, ovarian, tubal, or peritoneal cancers). Mammogram.** / Every year beginning at age 27 years and continuing for as long as you are in good health. Consult with your health care provider. Pap test.** / Every 3 years starting at age 89 years through age 28 or 28 years with a history of 3 consecutive normal Pap tests. HPV screening.** / Every 3 years from ages 52 years through ages 70 to 10 years with a history of 3 consecutive normal Pap tests. Fecal occult blood test (FOBT) of stool. / Every year beginning at age 67 years and continuing until age 43 years. You may not need to do  this test if you get a colonoscopy every 10 years. Flexible sigmoidoscopy or colonoscopy.** / Every 5 years for a flexible sigmoidoscopy or every 10 years for a colonoscopy beginning at age 47 years and continuing until age 39 years. Hepatitis C blood test.** / For all people born from [birth year] through 1965 and any individual with known risks for hepatitis C. Skin self-exam. / Monthly. Influenza vaccine. / Every year. Tetanus, diphtheria, and acellular pertussis (Tdap/Td) vaccine.** / Consult your health care provider. Pregnant women should receive 1 dose of Tdap vaccine during each pregnancy. 1 dose of Td every 10 years. Varicella vaccine.** / Consult your health care provider. Pregnant females who do not have evidence of immunity should receive the first dose after pregnancy. Zoster vaccine.** / 1 dose for adults aged 32 years or older. Pneumococcal 13-valent conjugate (PCV13) vaccine.** / Consult your health care provider. Pneumococcal polysaccharide (PPSV23) vaccine.** / 1 to 2 doses if you smoke cigarettes or if you have certain conditions. Meningococcal vaccine.** / Consult your health care provider. Hepatitis A vaccine.** / Consult your health care provider. Hepatitis B vaccine.** / Consult your health care provider. Screening for abdominal aortic aneurysm (AAA)  by ultrasound is recommended for people over 50 who have history of high blood pressure or who are current or former smokers. ++++++++++++++++++ Recommend Adult Low Dose Aspirin or  coated  Aspirin 81 mg daily  To reduce risk of Colon Cancer 40 %,  Skin Cancer 26 % ,  Melanoma 46%  and  Pancreatic cancer 60% +++++++++++++++++++ Vitamin D goal  is between 70-100.  Please make sure that you are taking your Vitamin D as directed.  It is very important as a natural anti-inflammatory  helping hair, skin, and nails, as well as reducing stroke and heart  attack risk.  It helps your bones and helps with mood. It also decreases  numerous cancer risks so please take it as directed.  Low Vit D is associated with a 200-300% higher risk for CANCER  and 200-300% higher risk for HEART   ATTACK  &  STROKE.   ...................................... It is also associated with higher death rate at younger ages,  autoimmune diseases like Rheumatoid arthritis, Lupus, Multiple Sclerosis.    Also many other serious conditions, like depression, Alzheimer's Dementia, infertility, muscle aches, fatigue, fibromyalgia - just to name a few. ++++++++++++++++++ Recommend the book "The END of DIETING" by Dr Joel Fuhrman  & the book "The END of DIABETES " by Dr Joel Fuhrman At [employer] - get book & Audio CD's    Being diabetic has a  300% increased risk for heart attack, stroke, cancer, and alzheimer- type vascular dementia. It is very important that you work harder with diet by avoiding all foods that are white. Avoid white rice (brown & wild rice is OK), white potatoes (sweetpotatoes in moderation is OK), White bread or wheat bread or anything made out of white flour like bagels, donuts, rolls, buns, biscuits, cakes, pastries, cookies, pizza crust, and pasta (made from white flour & egg whites) - vegetarian pasta or spinach or wheat pasta is OK. Multigrain breads like Arnold's or [employer], or multigrain sandwich thins or flatbreads.  Diet, exercise and weight loss can reverse and cure diabetes in the early stages.  Diet, exercise and weight loss is very important in the control and prevention of complications of diabetes which affects every system in your body, ie. Brain - dementia/stroke, eyes - glaucoma/blindness, heart - heart attack/heart failure, kidneys - dialysis, stomach - gastric paralysis, intestines - malabsorption, nerves - severe painful neuritis, circulation - gangrene & loss of a leg(s), and finally cancer and Alzheimers.    I recommend avoid fried & greasy foods,  sweets/candy, white rice (brown or wild rice or Quinoa is  OK), white potatoes (sweet potatoes are OK) - anything made from white flour - bagels, doughnuts, rolls, buns, biscuits,white and wheat breads, pizza crust and traditional pasta made of white flour & egg white(vegetarian pasta or spinach or wheat pasta is OK).  Multi-grain bread is OK - like multi-grain flat bread or sandwich thins. Avoid alcohol in excess. Exercise is also important.    Eat all the vegetables you want - avoid meat, especially red meat and dairy - especially cheese.  Cheese is the most concentrated form of trans-fats which is the worst thing to clog up our arteries. Veggie cheese is OK which can be found in the fresh produce section at [employer] or [employer] or [employer]  ++++++++++++++++++++++ DASH Eating Plan  DASH stands for "Dietary Approaches to Stop Hypertension."   The DASH eating plan is a healthy eating plan that has been shown to reduce high blood pressure (hypertension). Additional health benefits may include reducing the risk of type 2 diabetes mellitus, heart disease, and stroke. The DASH eating plan may also help with weight loss. WHAT DO I NEED TO KNOW ABOUT THE DASH EATING PLAN? For the DASH eating plan, you will follow these general guidelines: Choose foods with a percent daily value for sodium of less than 5% (as listed on the food label). Use salt-free seasonings or herbs instead of table salt or sea salt. Check with your health care provider or pharmacist before using salt substitutes. Eat lower-sodium products, often labeled as "lower sodium" or "no   salt added." Eat fresh foods. Eat more vegetables, fruits, and low-fat dairy products. Choose whole grains. Look for the word "whole" as the first word in the ingredient list. Choose fish  Limit sweets, desserts, sugars, and sugary drinks. Choose heart-healthy fats. Eat veggie cheese  Eat more home-cooked food and less restaurant, buffet, and fast food. Limit fried foods. Cook foods using methods other  than frying. Limit canned vegetables. If you do use them, rinse them well to decrease the sodium. When eating at a restaurant, ask that your food be prepared with less salt, or no salt if possible.                      WHAT FOODS CAN I EAT? Read Dr Joel Fuhrman's books on The End of Dieting & The End of Diabetes  Grains Whole grain or whole wheat bread. Brown rice. Whole grain or whole wheat pasta. Quinoa, bulgur, and whole grain cereals. Low-sodium cereals. Corn or whole wheat flour tortillas. Whole grain cornbread. Whole grain crackers. Low-sodium crackers.  Vegetables Fresh or frozen vegetables (raw, steamed, roasted, or grilled). Low-sodium or reduced-sodium tomato and vegetable juices. Low-sodium or reduced-sodium tomato sauce and paste. Low-sodium or reduced-sodium canned vegetables.   Fruits All fresh, canned (in natural juice), or frozen fruits.  Protein Products  All fish and seafood.  Dried beans, peas, or lentils. Unsalted nuts and seeds. Unsalted canned beans.  Dairy Low-fat dairy products, such as skim or 1% milk, 2% or reduced-fat cheeses, low-fat ricotta or cottage cheese, or plain low-fat yogurt. Low-sodium or reduced-sodium cheeses.  Fats and Oils Tub margarines without trans fats. Light or reduced-fat mayonnaise and salad dressings (reduced sodium). Avocado. Safflower, olive, or canola oils. Natural peanut or almond butter.  Other Unsalted popcorn and pretzels. The items listed above may not be a complete list of recommended foods or beverages. Contact your dietitian for more options.  ++++++++++++++++++  WHAT FOODS ARE NOT RECOMMENDED? Grains/ White flour or wheat flour White bread. White pasta. White rice. Refined cornbread. Bagels and croissants. Crackers that contain trans fat.  Vegetables  Creamed or fried vegetables. Vegetables in a . Regular canned vegetables. Regular canned tomato sauce and paste. Regular tomato and vegetable juices.  Fruits Dried  fruits. Canned fruit in light or heavy syrup. Fruit juice.  Meat and Other Protein Products Meat in general - RED meat & White meat.  Fatty cuts of meat. Ribs, chicken wings, all processed meats as bacon, sausage, bologna, salami, fatback, hot dogs, bratwurst and packaged luncheon meats.  Dairy Whole or 2% milk, cream, half-and-half, and cream cheese. Whole-fat or sweetened yogurt. Full-fat cheeses or blue cheese. Non-dairy creamers and whipped toppings. Processed cheese, cheese spreads, or cheese curds.  Condiments Onion and garlic salt, seasoned salt, table salt, and sea salt. Canned and packaged gravies. Worcestershire sauce. Tartar sauce. Barbecue sauce. Teriyaki sauce. Soy sauce, including reduced sodium. Steak sauce. Fish sauce. Oyster sauce. Cocktail sauce. Horseradish. Ketchup and mustard. Meat flavorings and tenderizers. Bouillon cubes. Hot sauce. Tabasco sauce. Marinades. Taco seasonings. Relishes.  Fats and Oils Butter, stick margarine, lard, shortening and bacon fat. Coconut, palm kernel, or palm oils. Regular salad dressings.  Pickles and olives. Salted popcorn and pretzels.  The items listed above may not be a complete list of foods and beverages to avoid.   

## 2020-12-18 DIAGNOSIS — Z20822 Contact with and (suspected) exposure to covid-19: Secondary | ICD-10-CM | POA: Diagnosis not present

## 2020-12-18 LAB — URINALYSIS, ROUTINE W REFLEX MICROSCOPIC
Bilirubin Urine: NEGATIVE
Glucose, UA: NEGATIVE
Hgb urine dipstick: NEGATIVE
Ketones, ur: NEGATIVE
Leukocytes,Ua: NEGATIVE
Nitrite: NEGATIVE
Protein, ur: NEGATIVE
Specific Gravity, Urine: 1.023 (ref 1.001–1.035)
pH: 5.5 (ref 5.0–8.0)

## 2020-12-18 LAB — LIPID PANEL
Cholesterol: 190 mg/dL (ref <200)
HDL: 57 mg/dL (ref >=50)
LDL Cholesterol (Calc): 113 mg/dL (calc) — ABNORMAL HIGH
Non-HDL Cholesterol (Calc): 133 mg/dL (calc) — ABNORMAL HIGH (ref <130)
Total CHOL/HDL Ratio: 3.3 (calc) (ref <5.0)
Triglycerides: 92 mg/dL (ref <150)

## 2020-12-18 LAB — CBC WITH DIFFERENTIAL/PLATELET
Absolute Monocytes: 372 cells/uL (ref 200–950)
Basophils Absolute: 19 cells/uL (ref 0–200)
Basophils Relative: 0.5 %
Eosinophils Absolute: 91 cells/uL (ref 15–500)
Eosinophils Relative: 2.4 %
HCT: 41.4 % (ref 35.0–45.0)
Hemoglobin: 13.3 g/dL (ref 11.7–15.5)
Lymphs Abs: 1790 cells/uL (ref 850–3900)
MCH: 26.2 pg — ABNORMAL LOW (ref 27.0–33.0)
MCHC: 32.1 g/dL (ref 32.0–36.0)
MCV: 81.7 fL (ref 80.0–100.0)
MPV: 10.4 fL (ref 7.5–12.5)
Monocytes Relative: 9.8 %
Neutro Abs: 1528 cells/uL (ref 1500–7800)
Neutrophils Relative %: 40.2 %
Platelets: 343 10*3/uL (ref 140–400)
RBC: 5.07 10*6/uL (ref 3.80–5.10)
RDW: 13.5 % (ref 11.0–15.0)
Total Lymphocyte: 47.1 %
WBC: 3.8 10*3/uL (ref 3.8–10.8)

## 2020-12-18 LAB — TSH: TSH: 1.6 mIU/L

## 2020-12-18 LAB — COMPLETE METABOLIC PANEL WITH GFR
AG Ratio: 1.5 (calc) (ref 1.0–2.5)
ALT: 17 U/L (ref 6–29)
AST: 22 U/L (ref 10–35)
Albumin: 4.3 g/dL (ref 3.6–5.1)
Alkaline phosphatase (APISO): 87 U/L (ref 37–153)
BUN: 9 mg/dL (ref 7–25)
CO2: 29 mmol/L (ref 20–32)
Calcium: 9.8 mg/dL (ref 8.6–10.4)
Chloride: 104 mmol/L (ref 98–110)
Creat: 0.8 mg/dL (ref 0.50–1.03)
Globulin: 2.9 g/dL (calc) (ref 1.9–3.7)
Glucose, Bld: 85 mg/dL (ref 65–99)
Potassium: 4.1 mmol/L (ref 3.5–5.3)
Sodium: 140 mmol/L (ref 135–146)
Total Bilirubin: 0.4 mg/dL (ref 0.2–1.2)
Total Protein: 7.2 g/dL (ref 6.1–8.1)
eGFR: 89 mL/min/{1.73_m2} (ref >=60)

## 2020-12-18 LAB — MICROALBUMIN / CREATININE URINE RATIO
Creatinine, Urine: 187 mg/dL (ref 20–275)
Microalb Creat Ratio: 1 mcg/mg creat (ref <30)
Microalb, Ur: 0.2 mg/dL

## 2020-12-18 LAB — MAGNESIUM: Magnesium: 2 mg/dL (ref 1.5–2.5)

## 2020-12-18 LAB — HEMOGLOBIN A1C
Hgb A1c MFr Bld: 5.6 % of total Hgb (ref <5.7)
Mean Plasma Glucose: 114 mg/dL
eAG (mmol/L): 6.3 mmol/L

## 2020-12-18 LAB — VITAMIN D 25 HYDROXY (VIT D DEFICIENCY, FRACTURES): Vit D, 25-Hydroxy: 43 ng/mL (ref 30–100)

## 2020-12-18 LAB — INSULIN, RANDOM: Insulin: 4.4 u[IU]/mL

## 2020-12-18 NOTE — Progress Notes (Signed)
============================================================ -   Test results slightly outside the reference range are not unusual. If there is anything important, I will review this with you,  otherwise it is considered normal test values.  If you have further questions,  please do not hesitate to contact me at the office or via My Chart.  ============================================================ ============================================================  -  Total Chol = 190    -      Great, But   - Bad Dangerous LDL Chol = 113 - Too High           (  Ideal or Goal is less than 70  !  )    - Cholesterol only comes from animal sources  - ie. meat, dairy, egg yolks  - Eat all the vegetables you want.  - Avoid meat, especially red meat - Beef AND Pork .  - Avoid cheese & dairy - milk & ice cream.     - Cheese is the most concentrated form of trans-fats which  is the worst thing to clog up our arteries.   - Veggie cheese is OK which can be found in the fresh  produce section at [employer] or [employer] or [employer] ============================================================ ============================================================  -  A1c = 5.6% - Normal  - No Diabetes   - Great ! ============================================================ ============================================================  - Vitamin D = 43 - Low - Recommend take Vitamin D 5,000 units  EVERY day  !   - Vitamin D goal is between 70-100.   - It is very important as a natural anti-inflammatory and helping the  immune system protect against viral infections, like the Covid-19    helping hair, skin, and nails, as well as reducing stroke and  heart attack risk.   - It helps your bones and helps with mood.  - It also decreases numerous cancer risks so please  take it as directed.   - Low Vit D is associated with a 200-300% higher risk for  CANCER   and 200-300% higher risk for HEART    ATTACK  &  STROKE.    - It is also associated with higher death rate at younger ages,   autoimmune diseases like Rheumatoid arthritis, Lupus,  Multiple Sclerosis.     - Also many other serious conditions, like depression, Alzheimer's  Dementia, infertility, muscle aches, fatigue, fibromyalgia   - just to name a few. ============================================================ ============================================================  - All Else - CBC - Kidneys - U/A -  Electrolytes - Liver - Magnesium & Thyroid    - all  Normal / OK ============================================================ ============================================================

## 2020-12-19 DIAGNOSIS — J3081 Allergic rhinitis due to animal (cat) (dog) hair and dander: Secondary | ICD-10-CM | POA: Diagnosis not present

## 2020-12-19 DIAGNOSIS — J301 Allergic rhinitis due to pollen: Secondary | ICD-10-CM | POA: Diagnosis not present

## 2020-12-19 DIAGNOSIS — J3089 Other allergic rhinitis: Secondary | ICD-10-CM | POA: Diagnosis not present

## 2020-12-24 DIAGNOSIS — J3089 Other allergic rhinitis: Secondary | ICD-10-CM | POA: Diagnosis not present

## 2020-12-24 DIAGNOSIS — J301 Allergic rhinitis due to pollen: Secondary | ICD-10-CM | POA: Diagnosis not present

## 2020-12-24 DIAGNOSIS — J3081 Allergic rhinitis due to animal (cat) (dog) hair and dander: Secondary | ICD-10-CM | POA: Diagnosis not present

## 2020-12-30 DIAGNOSIS — J301 Allergic rhinitis due to pollen: Secondary | ICD-10-CM | POA: Diagnosis not present

## 2020-12-30 DIAGNOSIS — J3089 Other allergic rhinitis: Secondary | ICD-10-CM | POA: Diagnosis not present

## 2020-12-30 DIAGNOSIS — J3081 Allergic rhinitis due to animal (cat) (dog) hair and dander: Secondary | ICD-10-CM | POA: Diagnosis not present

## 2021-01-06 DIAGNOSIS — J301 Allergic rhinitis due to pollen: Secondary | ICD-10-CM | POA: Diagnosis not present

## 2021-01-06 DIAGNOSIS — J3081 Allergic rhinitis due to animal (cat) (dog) hair and dander: Secondary | ICD-10-CM | POA: Diagnosis not present

## 2021-01-06 DIAGNOSIS — J3089 Other allergic rhinitis: Secondary | ICD-10-CM | POA: Diagnosis not present

## 2021-01-08 DIAGNOSIS — R059 Cough, unspecified: Secondary | ICD-10-CM | POA: Diagnosis not present

## 2021-01-08 DIAGNOSIS — Z9189 Other specified personal risk factors, not elsewhere classified: Secondary | ICD-10-CM | POA: Diagnosis not present

## 2021-01-08 DIAGNOSIS — Z1152 Encounter for screening for COVID-19: Secondary | ICD-10-CM | POA: Diagnosis not present

## 2021-01-12 DIAGNOSIS — J3089 Other allergic rhinitis: Secondary | ICD-10-CM | POA: Diagnosis not present

## 2021-01-12 DIAGNOSIS — J3081 Allergic rhinitis due to animal (cat) (dog) hair and dander: Secondary | ICD-10-CM | POA: Diagnosis not present

## 2021-01-12 DIAGNOSIS — J301 Allergic rhinitis due to pollen: Secondary | ICD-10-CM | POA: Diagnosis not present

## 2021-01-27 DIAGNOSIS — J301 Allergic rhinitis due to pollen: Secondary | ICD-10-CM | POA: Diagnosis not present

## 2021-01-27 DIAGNOSIS — J3081 Allergic rhinitis due to animal (cat) (dog) hair and dander: Secondary | ICD-10-CM | POA: Diagnosis not present

## 2021-01-27 DIAGNOSIS — J3089 Other allergic rhinitis: Secondary | ICD-10-CM | POA: Diagnosis not present

## 2021-02-02 DIAGNOSIS — J3081 Allergic rhinitis due to animal (cat) (dog) hair and dander: Secondary | ICD-10-CM | POA: Diagnosis not present

## 2021-02-02 DIAGNOSIS — J3089 Other allergic rhinitis: Secondary | ICD-10-CM | POA: Diagnosis not present

## 2021-02-02 DIAGNOSIS — J301 Allergic rhinitis due to pollen: Secondary | ICD-10-CM | POA: Diagnosis not present

## 2021-02-12 DIAGNOSIS — J301 Allergic rhinitis due to pollen: Secondary | ICD-10-CM | POA: Diagnosis not present

## 2021-02-12 DIAGNOSIS — J3081 Allergic rhinitis due to animal (cat) (dog) hair and dander: Secondary | ICD-10-CM | POA: Diagnosis not present

## 2021-02-12 DIAGNOSIS — J3089 Other allergic rhinitis: Secondary | ICD-10-CM | POA: Diagnosis not present

## 2021-02-25 DIAGNOSIS — J3081 Allergic rhinitis due to animal (cat) (dog) hair and dander: Secondary | ICD-10-CM | POA: Diagnosis not present

## 2021-02-25 DIAGNOSIS — J301 Allergic rhinitis due to pollen: Secondary | ICD-10-CM | POA: Diagnosis not present

## 2021-02-25 DIAGNOSIS — J3089 Other allergic rhinitis: Secondary | ICD-10-CM | POA: Diagnosis not present

## 2021-04-16 DIAGNOSIS — Z23 Encounter for immunization: Secondary | ICD-10-CM | POA: Diagnosis not present

## 2021-04-16 DIAGNOSIS — Z7189 Other specified counseling: Secondary | ICD-10-CM | POA: Diagnosis not present

## 2021-04-17 DIAGNOSIS — Z20822 Contact with and (suspected) exposure to covid-19: Secondary | ICD-10-CM | POA: Diagnosis not present

## 2021-06-15 ENCOUNTER — Ambulatory Visit: Payer: Medicare Other | Admitting: Nurse Practitioner

## 2021-07-28 NOTE — Progress Notes (Signed)
?MEDICARE ANNUAL WELLNESS VISIT AND FU ? ?Assessment:  ? ?Encounter for Medicare annual wellness exam ?1 year ?Mammogram scheduled ? ?CMT (Charcot-Marie-Tooth disease) ?Continue follow up with pain management, neuro ? ?Mixed hyperlipidemia ?-continue medications, check lipids, decrease fatty foods, increase activity.  ?-Lipid panel ?- CBC, CMP, TSH ? ?Medication management ?- Magnesium ? ?Labile Hypertension ?- controlled without medication, continue DASH diet, exercise and monitor at home. Call if greater than 130/80.  ? ?Abnormal glucose ?monitor ? ?Chronic Pain Syndrome/Pain in joint, multiple sites./ Neck pain ?- continue follow up pain management ? ?Vitamin D deficiency ?Continue supplementation ? ?Lumbar back Pain ?Xray of lumbar region ?Continue to monitor if worsens and Xray is negative will refer to orthopedics ? ? ?Gastroesophageal reflux disease, esophagitis presence not specified ?-   Continue PPI/H2 blocker, diet discussed ?- Magnesium ? ?Chronic asthma without complication, unspecified asthma severity, unspecified whether persistent ?-    Continue meds ? ? ?Overweight/BMI 25 ?- increase veggies, decrease carbs ?- long discussion about weight loss, diet, and exercise ? ?Constipation, unspecified constipation type ?GI following; improved with movantik; ? Trying new agent but unsure what ?- Increase fiber/ water intake, decrease caffeine, increase activity level ? ?Other fatigue ?Has had negative sleep study; felt secondary to chronic fatigue; monitor ? ?Anxiety ?Well managed by current regimen; continue medications  ?Stress management techniques discussed, increase water, good sleep hygiene discussed, increase exercise, and increase veggies.  ? ? ?Over 40 minutes of exam, counseling, chart review and critical decision making was performed ?Future Appointments  ?Date Time Provider [place]  ?12/17/2021  3:00 PM Unk Pinto, MD [facility] None  ?07/30/2022  9:00 AM Shaida Route, Townsend Roger, NP [facility]  None  ? ? ? ?Plan:  ? ?During the course of the visit the patient was educated and counseled about appropriate screening and preventive services including:  ? ?Pneumococcal vaccine  ?Prevnar 13 ?Influenza vaccine ?Td vaccine ?Screening electrocardiogram ?Bone densitometry screening ?Colorectal cancer screening ?Diabetes screening ?Glaucoma screening ?Nutrition counseling  ?Advanced directives: requested ? ? ?Subjective:  ?Brandi Erickson is a 51 y.o. female who presents for Medicare Annual Wellness Visit and follow up for predM, chol, HTN. ? ?She has chronic pain, was following with Dr. Andree Elk preferred pain, but reports hasn't been since 2021. Denies norco recently. Has been using CBD gummies with benefit. Has history of CMT, neck pain. Also RLM at night felt secondary to pain. Has TMJ and has new mouth piece from dentist.Has has a normal sleep study with Dr. Shela Leff 09/27/2018. Hx of pineal gland cyst, [facility] neuro following, stable per MRI 07/27/2018.  ? ?She has been in [place] for 3 months visiting her son who is stationed there.  She did not take any of her medications so has been off meds for 3 months and needs refills of all her medication. She returned 07/17/21 ? ?While away 04/2021 first week she fell down the steps and hit lower back  She continues to have pain in her lower back, more with movement. Describes the pain as sharp intermittent with aching.  ? ?Asthma on Breo, albuterol and well controlled.  ? ?Anxiety on effexor 37.5 mg daily and doing well. Was on Cymbalta with weight gain, declines. She does go to counseling intermittently, plans to restart now that pandemic is improving.  ? ?She has history of GERD, had normal EGD 02/2020, on PPI. Has had lower R sided sharp abdominal pain for years; prior to her partial hysterectomy/left salpingectomy in 2018. Does have hx of ectopic  pregnancy and was advised "lots of scarring." Had normal pelvic by Dr. Jerl Santos, normal AB [place]. She was referred to PT for pelvic floor  therapy without benefit. Had recent CT abd/pelvist 04/2020 that was benign other than large stool burden. Baseline BM every other day, GI has started her on miralax, senokot, linzess, amitiza without benefit; recently had benefit with movantik but cost limit, trying another agent but unsure what. GI is managing. Some question of adhesions. Colonoscopy - reports had in 10/2012. GI doctor is Dr. Tally Joe. Continues to use Motegrity for constipation. ? ?BMI is Body mass index is 25.41 kg/m?., she has been working on diet and exercise. ?Wt Readings from Last 3 Encounters:  ?07/29/21 157 lb 6.4 oz (71.4 kg)  ?12/17/20 159 lb (72.1 kg)  ?09/23/20 159 lb (72.1 kg)  ? ?Her blood pressure has been controlled at home, today their BP is BP: 136/76  ?BP Readings from Last 3 Encounters:  ?07/29/21 136/76  ?12/17/20 126/88  ?09/23/20 118/66  ?She does workout, walks 2 miles a day.  She denies chest pain, shortness of breath, dizziness. ? ? ?Saw Dr. Geraldo Pitter in cardiology for possible syncopal episodes, had normal stress test 06/20/2019.   ? ? She is not on cholesterol medication and denies myalgias. Her cholesterol is at goal. The cholesterol last visit was:   ?Lab Results  ?Component Value Date  ? CHOL 190 12/17/2020  ? HDL 57 12/17/2020  ? LDLCALC 113 (H) 12/17/2020  ? TRIG 92 12/17/2020  ? CHOLHDL 3.3 12/17/2020  ? ? She has been working on diet and exercise for glucose management, and denies polydipsia, polyuria and visual disturbances. Last A1C in the office was:  ?Lab Results  ?Component Value Date  ? HGBA1C 5.6 12/17/2020  ? ? Last GFR:  ?Lab Results  ?Component Value Date  ? GFRNONAA 91 06/12/2020  ? ?Patient is on Vitamin D supplement, ? 5000 IU daily.    ?Lab Results  ?Component Value Date  ? VD25OH 43 12/17/2020  ?   ? ? ?Medication Review: ? ? ?Current Outpatient Medications (Cardiovascular):  ?  EPINEPHrine 0.3 mg/0.3 mL IJ SOAJ injection, Inject 0.3 mg into the skin as needed for anaphylaxis. ? ?Current Outpatient  Medications (Respiratory):  ?  cetirizine (ZYRTEC) 10 MG tablet, Take 10 mg by mouth every evening. ?  fluticasone furoate-vilanterol (BREO ELLIPTA) 200-25 MCG/ACT AEPB, Inhale 1 puff into the lungs daily. ?  montelukast (SINGULAIR) 10 MG tablet, Take 1 tablet (10 mg total) by mouth at bedtime. ?  albuterol (PROAIR HFA) 108 (90 Base) MCG/ACT inhaler, Inhale 2 puffs into the lungs every 6 (six) hours as needed for wheezing or shortness of breath. ?  fluticasone (FLONASE) 50 MCG/ACT nasal spray, Place 1-2 sprays into both nostrils daily. ? ?Current Outpatient Medications (Analgesics):  ?  aspirin EC 81 MG tablet, Take 81 mg by mouth daily. (Patient not taking: Reported on 12/17/2020) ? ? ?Current Outpatient Medications (Other):  ?  Cholecalciferol (VITAMIN D) 125 MCG (5000 UT) CAPS, Take 1 capsule by mouth. Takes 5,000 units every 2 to 3 days. ?  polyethylene glycol (MIRALAX / GLYCOLAX) 17 g packet, Take 17 g by mouth daily. ?  tretinoin (RETIN-A) 0.05 % cream, Apply 1 application topically at bedtime. ?  fluticasone (CUTIVATE) 0.05 % cream, Apply 1 application. topically 2 (two) times daily. ?  gabapentin (NEURONTIN) 800 MG tablet, Take 1 tablet (800 mg total) by mouth in the morning, at noon, in the evening, and at bedtime. ?  methocarbamol (ROBAXIN) 500 MG tablet, Take 1 tablet (500 mg total) by mouth 3 (three) times daily as needed for muscle spasms. ?  MOTEGRITY 2 MG TABS, Take 1 tablet (2 mg total) by mouth daily. ?  pantoprazole (PROTONIX) 40 MG tablet, Take 1 tablet (40 mg total) by mouth 2 (two) times daily before a meal. ?  venlafaxine XR (EFFEXOR XR) 37.5 MG 24 hr capsule, Take 1 capsule (37.5 mg total) by mouth daily with breakfast. ? ? ?Allergies  ?Allergen Reactions  ? Peanut-Containing Drug Products Other (See Comments)  ?  Patient states that her throat swells.  ? Linaclotide Rash  ?  Other reaction(s): hives  ? Iodinated Contrast Media Nausea And Vomiting  ?  MRI dye ? nausea  ? Methocarbamol   ?   Other reaction(s): itching  ? Oxycodone Hcl Nausea And Vomiting  ?  Other reaction(s): itching  ? Penicillins Hives  ? Shellfish Allergy Other (See Comments)  ? Sulfa Antibiotics Hives  ? Topamax [Topiramate]

## 2021-07-29 ENCOUNTER — Ambulatory Visit
Admission: RE | Admit: 2021-07-29 | Discharge: 2021-07-29 | Disposition: A | Discharge status: Home / Self Care | Payer: Medicare Other | Source: Ambulatory Visit | Attending: Nurse Practitioner | Admitting: Nurse Practitioner

## 2021-07-29 ENCOUNTER — Encounter: Payer: Self-pay | Admitting: Nurse Practitioner

## 2021-07-29 ENCOUNTER — Ambulatory Visit (INDEPENDENT_AMBULATORY_CARE_PROVIDER_SITE_OTHER): Payer: Medicare Other | Admitting: Nurse Practitioner

## 2021-07-29 VITALS — BP 136/76 | HR 60 | Temp 97.3°F | Wt 157.4 lb

## 2021-07-29 DIAGNOSIS — Z0000 Encounter for general adult medical examination without abnormal findings: Secondary | ICD-10-CM

## 2021-07-29 DIAGNOSIS — G894 Chronic pain syndrome: Secondary | ICD-10-CM | POA: Diagnosis not present

## 2021-07-29 DIAGNOSIS — R0989 Other specified symptoms and signs involving the circulatory and respiratory systems: Secondary | ICD-10-CM

## 2021-07-29 DIAGNOSIS — Z79899 Other long term (current) drug therapy: Secondary | ICD-10-CM

## 2021-07-29 DIAGNOSIS — J45909 Unspecified asthma, uncomplicated: Secondary | ICD-10-CM

## 2021-07-29 DIAGNOSIS — E663 Overweight: Secondary | ICD-10-CM

## 2021-07-29 DIAGNOSIS — M545 Low back pain, unspecified: Secondary | ICD-10-CM | POA: Diagnosis not present

## 2021-07-29 DIAGNOSIS — R6889 Other general symptoms and signs: Secondary | ICD-10-CM | POA: Diagnosis not present

## 2021-07-29 DIAGNOSIS — R7309 Other abnormal glucose: Secondary | ICD-10-CM | POA: Diagnosis not present

## 2021-07-29 DIAGNOSIS — E782 Mixed hyperlipidemia: Secondary | ICD-10-CM

## 2021-07-29 DIAGNOSIS — K219 Gastro-esophageal reflux disease without esophagitis: Secondary | ICD-10-CM

## 2021-07-29 DIAGNOSIS — Z0001 Encounter for general adult medical examination with abnormal findings: Secondary | ICD-10-CM | POA: Diagnosis not present

## 2021-07-29 DIAGNOSIS — E559 Vitamin D deficiency, unspecified: Secondary | ICD-10-CM

## 2021-07-29 DIAGNOSIS — M2550 Pain in unspecified joint: Secondary | ICD-10-CM

## 2021-07-29 DIAGNOSIS — G2581 Restless legs syndrome: Secondary | ICD-10-CM

## 2021-07-29 DIAGNOSIS — R5383 Other fatigue: Secondary | ICD-10-CM | POA: Diagnosis not present

## 2021-07-29 DIAGNOSIS — M542 Cervicalgia: Secondary | ICD-10-CM

## 2021-07-29 DIAGNOSIS — R1031 Right lower quadrant pain: Secondary | ICD-10-CM

## 2021-07-29 DIAGNOSIS — K5909 Other constipation: Secondary | ICD-10-CM

## 2021-07-29 DIAGNOSIS — F419 Anxiety disorder, unspecified: Secondary | ICD-10-CM

## 2021-07-29 DIAGNOSIS — G600 Hereditary motor and sensory neuropathy: Secondary | ICD-10-CM

## 2021-07-29 MED ORDER — MOTEGRITY 2 MG PO TABS: 1.0000 | ORAL_TABLET | Freq: Every day | ORAL | 3 refills | Status: DC

## 2021-07-29 MED ORDER — ALBUTEROL SULFATE HFA 108 (90 BASE) MCG/ACT IN AERS: 2.0000 | INHALATION_SPRAY | Freq: Four times a day (QID) | RESPIRATORY_TRACT | 4 refills | Status: AC | PRN

## 2021-07-29 MED ORDER — VENLAFAXINE HCL ER 37.5 MG PO CP24: 37.5000 mg | ORAL_CAPSULE | Freq: Every day | ORAL | 1 refills | Status: AC

## 2021-07-29 MED ORDER — METHOCARBAMOL 500 MG PO TABS: 500.0000 mg | ORAL_TABLET | Freq: Three times a day (TID) | ORAL | 1 refills | Status: AC | PRN

## 2021-07-29 MED ORDER — FLUTICASONE FUROATE-VILANTEROL 200-25 MCG/ACT IN AEPB: 1.0000 | INHALATION_SPRAY | Freq: Every day | RESPIRATORY_TRACT | 3 refills | Status: DC

## 2021-07-29 MED ORDER — FLUTICASONE PROPIONATE 50 MCG/ACT NA SUSP: 1.0000 | Freq: Every day | NASAL | 2 refills | Status: AC

## 2021-07-29 MED ORDER — FLUTICASONE PROPIONATE 0.05 % EX CREA: 1.0000 "application " | TOPICAL_CREAM | Freq: Two times a day (BID) | CUTANEOUS | 2 refills | Status: AC

## 2021-07-29 MED ORDER — GABAPENTIN 800 MG PO TABS: 800.0000 mg | ORAL_TABLET | Freq: Four times a day (QID) | ORAL | 2 refills | Status: AC

## 2021-07-29 MED ORDER — PANTOPRAZOLE SODIUM 40 MG PO TBEC: 40.0000 mg | DELAYED_RELEASE_TABLET | Freq: Two times a day (BID) | ORAL | 2 refills | Status: AC

## 2021-07-30 LAB — CBC WITH DIFFERENTIAL/PLATELET
Absolute Monocytes: 447 cells/uL (ref 200–950)
Basophils Absolute: 19 cells/uL (ref 0–200)
Basophils Relative: 0.4 %
Eosinophils Absolute: 42 cells/uL (ref 15–500)
Eosinophils Relative: 0.9 %
HCT: 41.9 % (ref 35.0–45.0)
Hemoglobin: 13.5 g/dL (ref 11.7–15.5)
Lymphs Abs: 2040 cells/uL (ref 850–3900)
MCH: 25.8 pg — ABNORMAL LOW (ref 27.0–33.0)
MCHC: 32.2 g/dL (ref 32.0–36.0)
MCV: 80.1 fL (ref 80.0–100.0)
MPV: 10.1 fL (ref 7.5–12.5)
Monocytes Relative: 9.5 %
Neutro Abs: 2153 cells/uL (ref 1500–7800)
Neutrophils Relative %: 45.8 %
Platelets: 365 10*3/uL (ref 140–400)
RBC: 5.23 10*6/uL — ABNORMAL HIGH (ref 3.80–5.10)
RDW: 13.5 % (ref 11.0–15.0)
Total Lymphocyte: 43.4 %
WBC: 4.7 10*3/uL (ref 3.8–10.8)

## 2021-07-30 LAB — COMPLETE METABOLIC PANEL WITH GFR
AG Ratio: 1.5 (calc) (ref 1.0–2.5)
ALT: 49 U/L — ABNORMAL HIGH (ref 6–29)
AST: 44 U/L — ABNORMAL HIGH (ref 10–35)
Albumin: 4.6 g/dL (ref 3.6–5.1)
Alkaline phosphatase (APISO): 99 U/L (ref 37–153)
BUN: 9 mg/dL (ref 7–25)
CO2: 30 mmol/L (ref 20–32)
Calcium: 10.3 mg/dL (ref 8.6–10.4)
Chloride: 102 mmol/L (ref 98–110)
Creat: 0.79 mg/dL (ref 0.50–1.03)
Globulin: 3.1 g/dL (calc) (ref 1.9–3.7)
Glucose, Bld: 91 mg/dL (ref 65–99)
Potassium: 4.5 mmol/L (ref 3.5–5.3)
Sodium: 139 mmol/L (ref 135–146)
Total Bilirubin: 0.4 mg/dL (ref 0.2–1.2)
Total Protein: 7.7 g/dL (ref 6.1–8.1)
eGFR: 90 mL/min/{1.73_m2} (ref >=60)

## 2021-07-30 LAB — MAGNESIUM: Magnesium: 2.2 mg/dL (ref 1.5–2.5)

## 2021-07-30 LAB — LIPID PANEL
Cholesterol: 201 mg/dL — ABNORMAL HIGH (ref <200)
HDL: 64 mg/dL (ref >=50)
LDL Cholesterol (Calc): 110 mg/dL (calc) — ABNORMAL HIGH
Non-HDL Cholesterol (Calc): 137 mg/dL (calc) — ABNORMAL HIGH (ref <130)
Total CHOL/HDL Ratio: 3.1 (calc) (ref <5.0)
Triglycerides: 152 mg/dL — ABNORMAL HIGH (ref <150)

## 2021-07-30 LAB — TSH: TSH: 2.41 mIU/L

## 2021-08-01 IMAGING — MG MM DIGITAL SCREENING BILAT W/ TOMO AND CAD
8 series · 9 of 24 positions shown · non-contrast
Comparison: Previous exam(s).

CLINICAL DATA: Screening.

EXAM:
DIGITAL SCREENING BILATERAL MAMMOGRAM WITH TOMOSYNTHESIS AND CAD
TECHNIQUE: Bilateral screening digital craniocaudal and mediolateral oblique
mammograms were obtained. Bilateral screening digital breast
tomosynthesis was performed. The images were evaluated with
computer-aided detection.

[L MLO synth-2D]
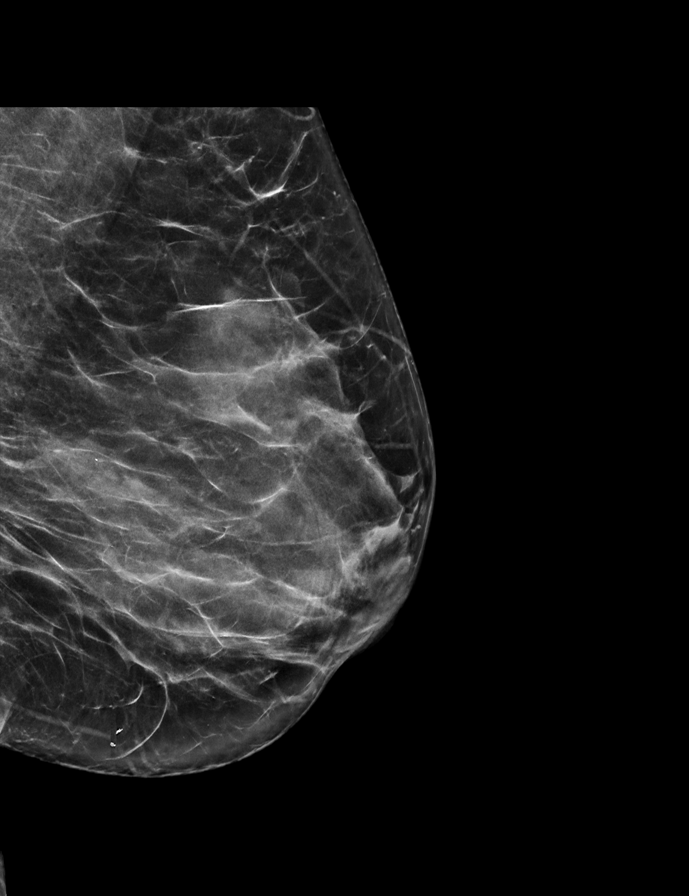

[R CC synth-2D]
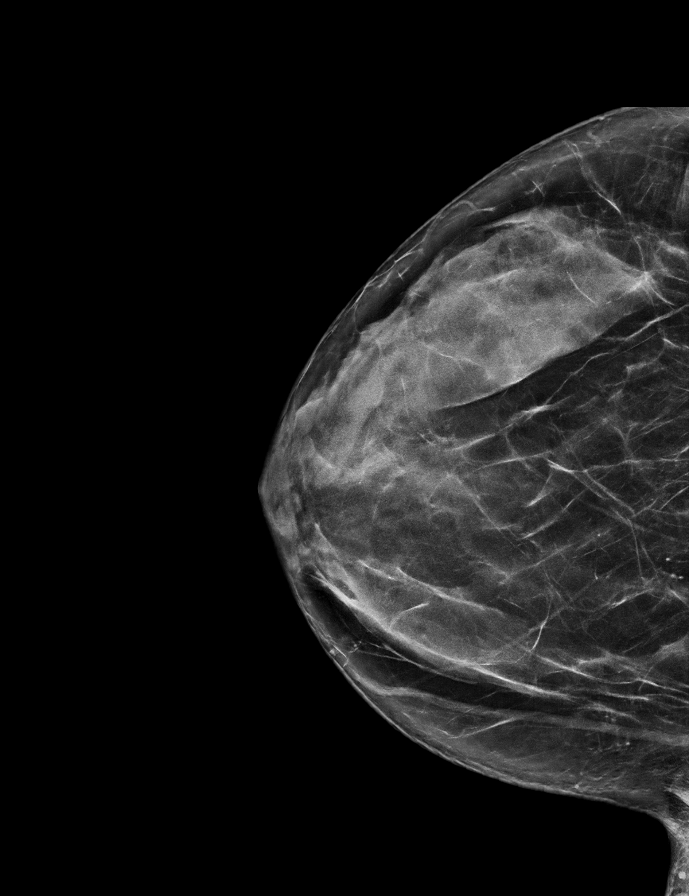

[L CC synth-2D]
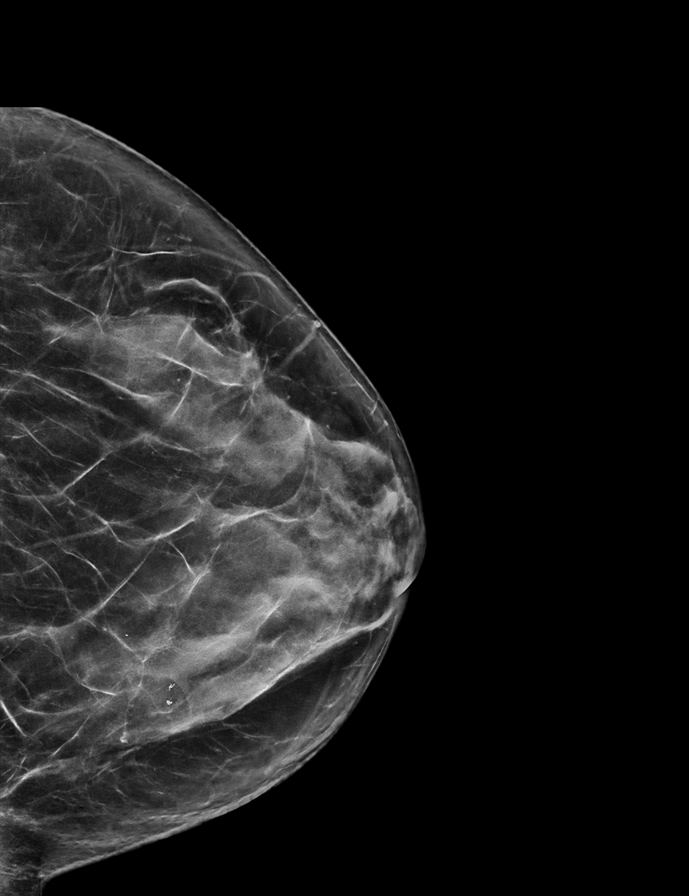

[R MLO synth-2D]
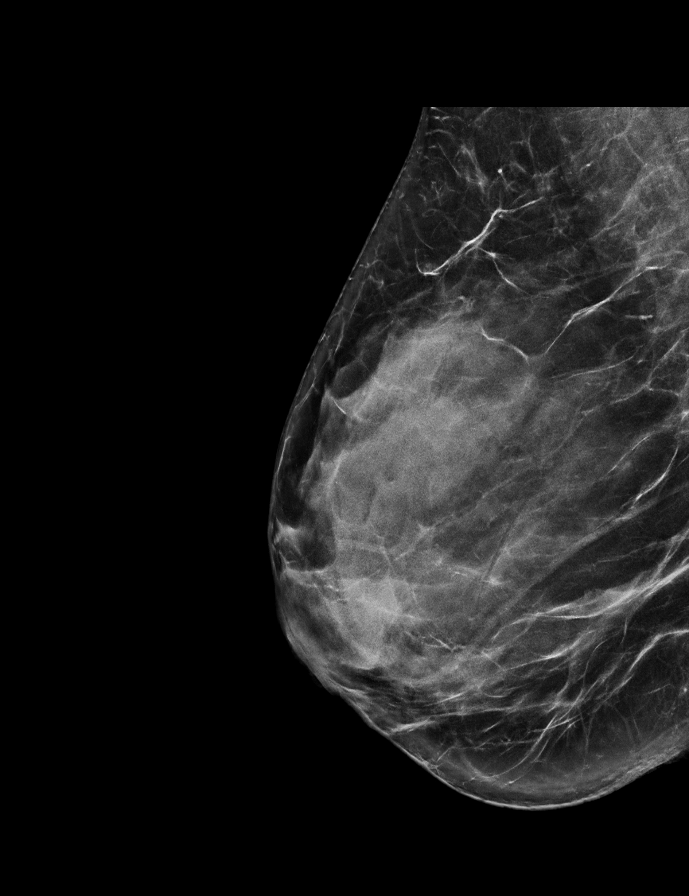

[L MLO tomo · 2 of 68 frames shown]
[frame 22/68]
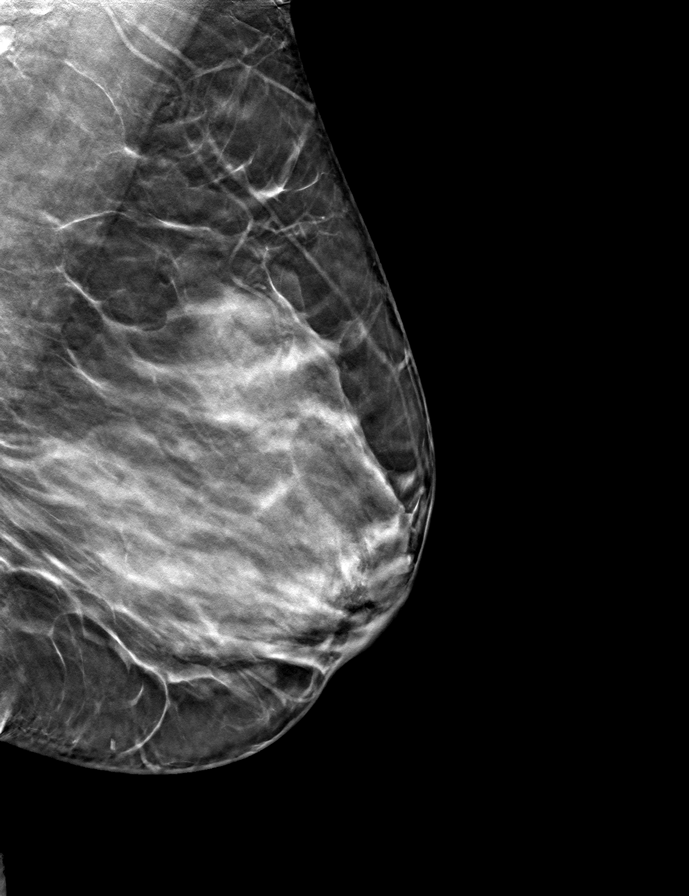
[frame 35/68]
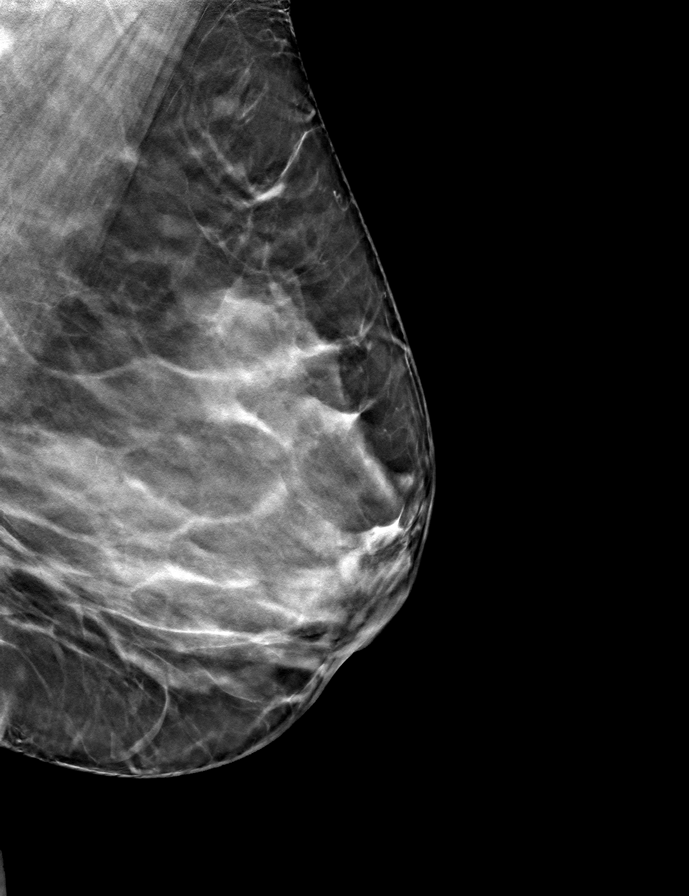

[R MLO tomo · tomo slice 33/66.0]
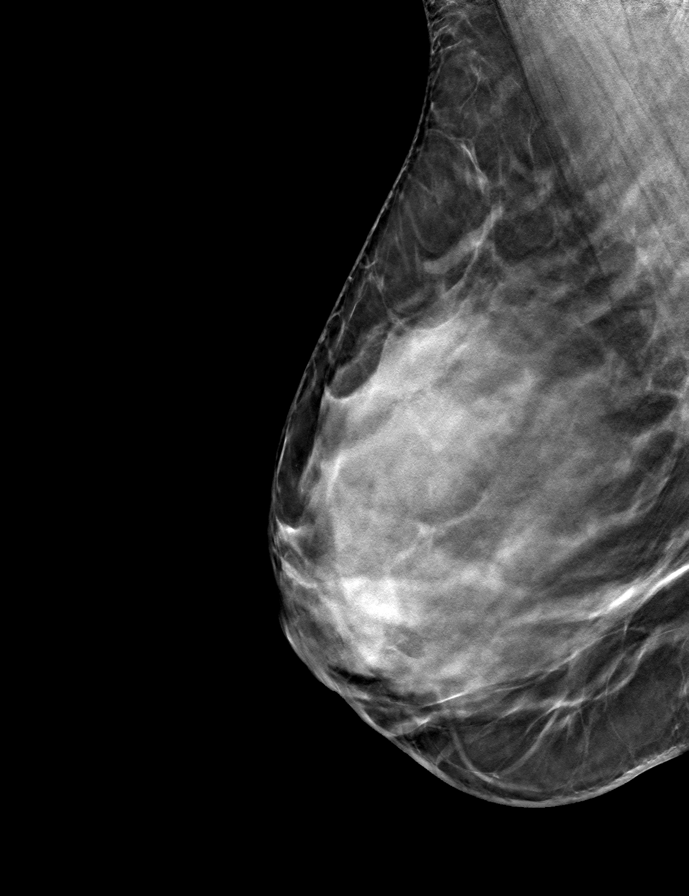

[L CC tomo · tomo slice 35/68.0]
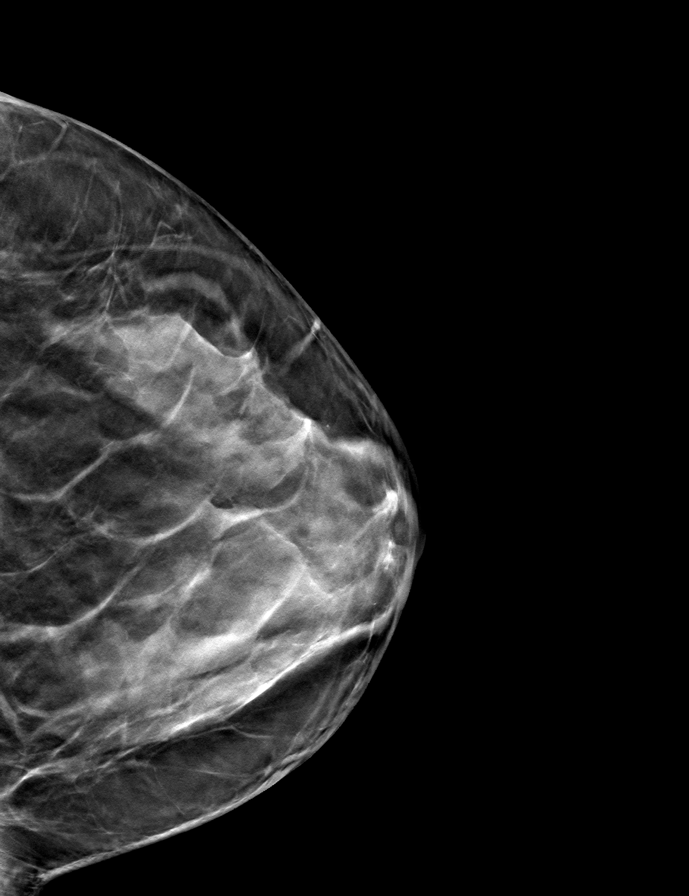

[R CC tomo · tomo slice 33/65.0]
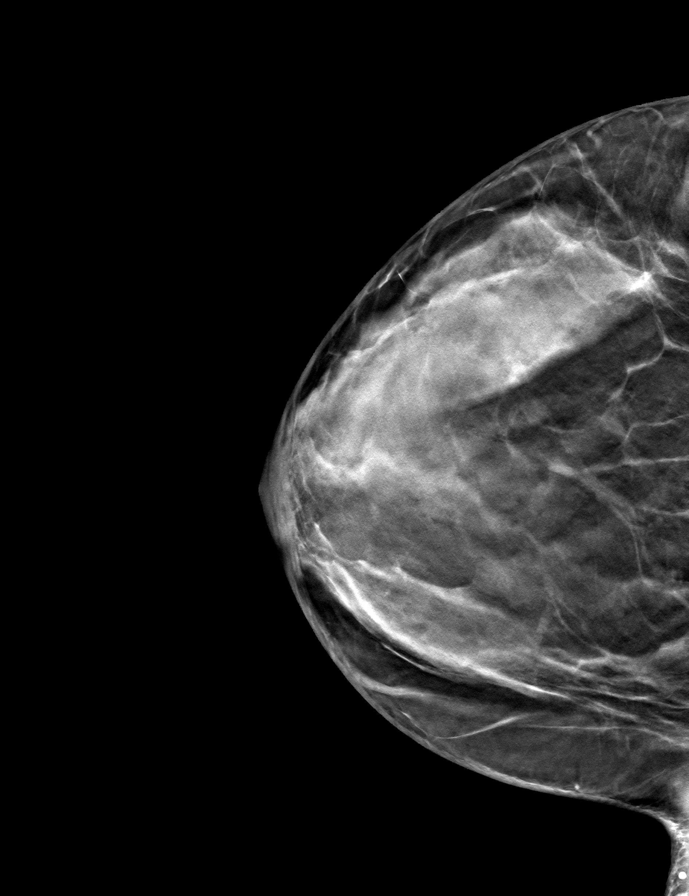

[9 of 24 positions shown; findings below may reference images not displayed]

ACR Breast Density Category d: The breast tissue is extremely dense,
which lowers the sensitivity of mammography
FINDINGS: There are no findings suspicious for malignancy. The images were
evaluated with computer-aided detection.
IMPRESSION: No mammographic evidence of malignancy. A result letter of this
screening mammogram will be mailed directly to the patient.

RECOMMENDATION:
Screening mammogram in one year. (Code:95-0-E9V)

BI-RADS CATEGORY  1: Negative.

## 2021-08-03 ENCOUNTER — Telehealth: Payer: Self-pay | Admitting: Nurse Practitioner

## 2021-08-03 NOTE — Telephone Encounter (Signed)
Patient states that she received her bloodwork via mychart and that she is concerned with her liver enzymes being elevated. She states that she very rarely drinks alcohol and would like to speak with the nurse.  ?

## 2021-08-04 DIAGNOSIS — K5904 Chronic idiopathic constipation: Secondary | ICD-10-CM | POA: Diagnosis not present

## 2021-08-04 DIAGNOSIS — K219 Gastro-esophageal reflux disease without esophagitis: Secondary | ICD-10-CM | POA: Diagnosis not present

## 2021-08-07 DIAGNOSIS — Z9181 History of falling: Secondary | ICD-10-CM | POA: Diagnosis not present

## 2021-08-07 DIAGNOSIS — M6281 Muscle weakness (generalized): Secondary | ICD-10-CM | POA: Diagnosis not present

## 2021-08-19 ENCOUNTER — Other Ambulatory Visit: Payer: Self-pay | Admitting: Internal Medicine

## 2021-08-19 DIAGNOSIS — J45909 Unspecified asthma, uncomplicated: Secondary | ICD-10-CM

## 2021-08-19 DIAGNOSIS — K5909 Other constipation: Secondary | ICD-10-CM

## 2021-08-19 MED ORDER — MOTEGRITY 2 MG PO TABS: ORAL_TABLET | ORAL | 3 refills | Status: DC

## 2021-08-19 MED ORDER — FLUTICASONE FUROATE-VILANTEROL 100-25 MCG/ACT IN AEPB: INHALATION_SPRAY | RESPIRATORY_TRACT | 3 refills | Status: DC

## 2021-08-21 ENCOUNTER — Other Ambulatory Visit: Payer: Self-pay | Admitting: Nurse Practitioner

## 2021-08-21 DIAGNOSIS — K5909 Other constipation: Secondary | ICD-10-CM

## 2021-08-21 DIAGNOSIS — J45909 Unspecified asthma, uncomplicated: Secondary | ICD-10-CM

## 2021-08-21 MED ORDER — FLUTICASONE FUROATE-VILANTEROL 100-25 MCG/ACT IN AEPB: INHALATION_SPRAY | RESPIRATORY_TRACT | 3 refills | Status: AC

## 2021-08-21 MED ORDER — MOTEGRITY 2 MG PO TABS: ORAL_TABLET | ORAL | 3 refills | Status: AC

## 2021-09-02 DIAGNOSIS — H16223 Keratoconjunctivitis sicca, not specified as Sjogren's, bilateral: Secondary | ICD-10-CM | POA: Diagnosis not present

## 2021-09-02 DIAGNOSIS — H0102B Squamous blepharitis left eye, upper and lower eyelids: Secondary | ICD-10-CM | POA: Diagnosis not present

## 2021-09-02 DIAGNOSIS — H2513 Age-related nuclear cataract, bilateral: Secondary | ICD-10-CM | POA: Diagnosis not present

## 2021-09-02 DIAGNOSIS — H5713 Ocular pain, bilateral: Secondary | ICD-10-CM | POA: Diagnosis not present

## 2021-09-02 DIAGNOSIS — H1045 Other chronic allergic conjunctivitis: Secondary | ICD-10-CM | POA: Diagnosis not present

## 2021-09-02 DIAGNOSIS — H0102A Squamous blepharitis right eye, upper and lower eyelids: Secondary | ICD-10-CM | POA: Diagnosis not present

## 2021-10-16 DIAGNOSIS — H1045 Other chronic allergic conjunctivitis: Secondary | ICD-10-CM | POA: Diagnosis not present

## 2021-10-16 DIAGNOSIS — H5213 Myopia, bilateral: Secondary | ICD-10-CM | POA: Diagnosis not present

## 2021-10-16 DIAGNOSIS — H52223 Regular astigmatism, bilateral: Secondary | ICD-10-CM | POA: Diagnosis not present

## 2021-10-16 DIAGNOSIS — H31091 Other chorioretinal scars, right eye: Secondary | ICD-10-CM | POA: Diagnosis not present

## 2021-10-16 DIAGNOSIS — H2513 Age-related nuclear cataract, bilateral: Secondary | ICD-10-CM | POA: Diagnosis not present

## 2021-10-16 DIAGNOSIS — H524 Presbyopia: Secondary | ICD-10-CM | POA: Diagnosis not present

## 2021-10-16 DIAGNOSIS — H0102B Squamous blepharitis left eye, upper and lower eyelids: Secondary | ICD-10-CM | POA: Diagnosis not present

## 2021-10-16 DIAGNOSIS — H0102A Squamous blepharitis right eye, upper and lower eyelids: Secondary | ICD-10-CM | POA: Diagnosis not present

## 2021-10-22 DIAGNOSIS — J3081 Allergic rhinitis due to animal (cat) (dog) hair and dander: Secondary | ICD-10-CM | POA: Diagnosis not present

## 2021-10-22 DIAGNOSIS — J4530 Mild persistent asthma, uncomplicated: Secondary | ICD-10-CM | POA: Diagnosis not present

## 2021-10-22 DIAGNOSIS — J3089 Other allergic rhinitis: Secondary | ICD-10-CM | POA: Diagnosis not present

## 2021-10-22 DIAGNOSIS — H1045 Other chronic allergic conjunctivitis: Secondary | ICD-10-CM | POA: Diagnosis not present

## 2021-10-28 DIAGNOSIS — J3089 Other allergic rhinitis: Secondary | ICD-10-CM | POA: Diagnosis not present

## 2021-10-28 DIAGNOSIS — J3081 Allergic rhinitis due to animal (cat) (dog) hair and dander: Secondary | ICD-10-CM | POA: Diagnosis not present

## 2021-10-28 DIAGNOSIS — J301 Allergic rhinitis due to pollen: Secondary | ICD-10-CM | POA: Diagnosis not present

## 2021-12-15 DIAGNOSIS — L821 Other seborrheic keratosis: Secondary | ICD-10-CM | POA: Diagnosis not present

## 2021-12-15 DIAGNOSIS — L700 Acne vulgaris: Secondary | ICD-10-CM | POA: Diagnosis not present

## 2021-12-16 NOTE — Progress Notes (Unsigned)
CPE and FOLLOW UP  Assessment:   Encounter for General Adult Medical Examination with Abnormal Findings Due yearly   CMT (Charcot-Marie-Tooth disease) Continue follow up with pain management, neuro  Mixed hyperlipidemia -continue medications, check lipids, decrease fatty foods, increase activity.  -Lipid panel - CBC, CMP, TSH  Medication management - Magnesium  Labile Hypertension - controlled without medication, continue DASH diet, exercise and monitor at home. Call if greater than 130/80.   Abnormal glucose monitor  Chronic Pain Syndrome/Pain in joint, multiple sites./ Neck pain - continue follow up pain management  Vitamin D deficiency Continue supplementation  Lumbar back Pain Xray of lumbar region Continue to monitor if worsens and Xray is negative will refer to orthopedics   Gastroesophageal reflux disease, esophagitis presence not specified -   Continue PPI/H2 blocker, diet discussed - Magnesium  Chronic asthma without complication, unspecified asthma severity, unspecified whether persistent -    Continue meds   Overweight/BMI 25 - long discussion about weight loss, diet, and exercise - Has been exercising daily and eating very clean - Rule out thyroid and insulin resistance - TSH, CMP  Constipation, unspecified constipation type GI following; improved with movantik; ? Trying new agent but unsure what - Increase fiber/ water intake, decrease caffeine, increase activity level  Other fatigue Has had negative sleep study; felt secondary to chronic fatigue; monitor  Right lower quadrant abdominal pain - Ultrasound pelvic with intravaginal  Anxiety Well managed by current regimen; continue medications  Stress management techniques discussed, increase water, good sleep hygiene discussed, increase exercise, and increase veggies.   Screening for ischemic heart disease - EKG  Screening for hematuria/proteinuria - Routine UA with reflex microscopic -  Microalbumin/creatinine urine ratio  Screening for thyroid disorder - TSH  Screening for AAA - [place] retroperitoneal ABD, LTD  Over 40 minutes of exam, counseling, chart review and critical decision making was performed Future Appointments  Date Time Provider [place]  12/21/2021  9:40 AM Revankar, Reita Cliche, MD [facility] None  07/30/2022  9:00 AM Alycia Rossetti, NP [facility] None  12/20/2022  9:00 AM Alycia Rossetti, NP [facility] None      Subjective:  Brandi Erickson is a 51 y.o. female who presents for CPE and follow up for predM, chol, HTN.  She has chronic pain, was following with Dr. Andree Elk preferred pain, but reports hasn't been since 2021. Denies norco recently. Has been using CBD gummies with benefit. Has history of CMT, neck pain. Also RLM at night felt secondary to pain. Has TMJ and has new mouth piece from dentist.Has has a normal sleep study with Dr. Shela Leff 09/27/2018. Hx of pineal gland cyst, [facility] neuro following, stable per MRI 07/27/2018.    While away 04/2021 first week she fell down the steps and hit lower back  Pain has improved.   Asthma on Breo, albuterol and well controlled.   Anxiety on effexor 37.5 mg daily and doing well.She does go to counseling intermittently, plans to restart now that pandemic is improving.   She has history of GERD, had normal EGD 02/2020, on PPI. Has had lower R sided sharp abdominal pain for years; prior to her partial hysterectomy/left salpingectomy in 2018. Does have hx of ectopic pregnancy and was advised "lots of scarring." Had normal pelvic by Dr. Jerl Santos, normal AB [place]. She was referred to PT for pelvic floor therapy without benefit. Had recent CT abd/pelvist 04/2020 that was benign other than large stool burden. Baseline BM every other day, GI has started  her on miralax, senokot, linzess, amitiza without benefit; recently had benefit with movantik but cost limit, trying another agent but unsure what. GI is managing. Some question of  adhesions. Colonoscopy - reports had in 10/2012. GI doctor is Dr. Tally Joe. Continues to use Motegrity for constipation. She is much better controlled currently  BMI is Body mass index is 26.15 kg/m., she has been working on diet and exercise. She is eating salmon/turkey and fruits/vegetables. She is exercising on her exercise bike daily. She has started to have hot flashes- possible menopause causes for weight gain Wt Readings from Last 3 Encounters:  12/17/21 162 lb (73.5 kg)  07/29/21 157 lb 6.4 oz (71.4 kg)  12/17/20 159 lb (72.1 kg)   Her blood pressure has been controlled at home, today their BP is BP: 120/68  BP Readings from Last 3 Encounters:  12/17/21 120/68  07/29/21 136/76  12/17/20 126/88  She does workout, walks 2 miles a day.  She denies chest pain, shortness of breath, dizziness.   Saw Dr. Geraldo Pitter in cardiology for possible syncopal episodes, had normal stress test 06/20/2019.     She is not on cholesterol medication and denies myalgias. Her cholesterol is at goal. The cholesterol last visit was:   Lab Results  Component Value Date   CHOL 201 (H) 07/29/2021   HDL 64 07/29/2021   LDLCALC 110 (H) 07/29/2021   TRIG 152 (H) 07/29/2021   CHOLHDL 3.1 07/29/2021    She has been working on diet and exercise for glucose management, and denies polydipsia, polyuria and visual disturbances. Last A1C in the office was:  Lab Results  Component Value Date   HGBA1C 5.6 12/17/2020    Last GFR:  Lab Results  Component Value Date   EGFR 90 07/29/2021    Patient is on Vitamin D supplement,  5000 IU daily every 2-3 days  Lab Results  Component Value Date   VD25OH 60 12/17/2020     She has been having RLQ abdominal pain off and on x several months.  She has had a hysterectomy of uterus only.  Medication Review:   Current Outpatient Medications (Cardiovascular):    EPINEPHrine 0.3 mg/0.3 mL IJ SOAJ injection, Inject 0.3 mg into the skin as needed for anaphylaxis.  Current  Outpatient Medications (Respiratory):    albuterol (PROAIR HFA) 108 (90 Base) MCG/ACT inhaler, Inhale 2 puffs into the lungs every 6 (six) hours as needed for wheezing or shortness of breath.   cetirizine (ZYRTEC) 10 MG tablet, Take 10 mg by mouth every evening.   fluticasone (FLONASE) 50 MCG/ACT nasal spray, Place 1-2 sprays into both nostrils daily.   fluticasone furoate-vilanterol (BREO ELLIPTA) 100-25 MCG/ACT AEPB, Use  1 Inhalation  Daily for Asthma   montelukast (SINGULAIR) 10 MG tablet, Take 1 tablet (10 mg total) by mouth at bedtime.  Current Outpatient Medications (Analgesics):    aspirin EC 81 MG tablet, Take 81 mg by mouth daily. (Patient not taking: Reported on 12/17/2020)   Current Outpatient Medications (Other):    Cholecalciferol (VITAMIN D) 125 MCG (5000 UT) CAPS, Take 1 capsule by mouth. Takes 5,000 units every 2 to 3 days.   fluticasone (CUTIVATE) 0.05 % cream, Apply 1 application. topically 2 (two) times daily.   gabapentin (NEURONTIN) 800 MG tablet, Take 1 tablet (800 mg total) by mouth in the morning, at noon, in the evening, and at bedtime.   Garlic (GARLIQUE PO), Take by mouth.   methocarbamol (ROBAXIN) 500 MG tablet, Take 1 tablet (500  mg total) by mouth 3 (three) times daily as needed for muscle spasms.   pantoprazole (PROTONIX) 40 MG tablet, Take 1 tablet (40 mg total) by mouth 2 (two) times daily before a meal.   polyethylene glycol (MIRALAX / GLYCOLAX) 17 g packet, Take 17 g by mouth daily.   Prucalopride Succinate (MOTEGRITY) 2 MG TABS, Take   1 tablet  Daily  for Chronic Constipation   tretinoin (RETIN-A) 0.05 % cream, Apply 1 application topically at bedtime.   venlafaxine XR (EFFEXOR XR) 37.5 MG 24 hr capsule, Take 1 capsule (37.5 mg total) by mouth daily with breakfast.   Allergies  Allergen Reactions   Peanut-Containing Drug Products Other (See Comments)    Patient states that her throat swells.   Linaclotide Rash    Other reaction(s): hives   Iodinated  Contrast Media Nausea And Vomiting    MRI dye  nausea   Methocarbamol     Other reaction(s): itching   Oxycodone Hcl Nausea And Vomiting    Other reaction(s): itching   Penicillins Hives   Shellfish Allergy Other (See Comments)   Sulfa Antibiotics Hives   Topamax [Topiramate] Hives   Lactose Rash    Current Problems (verified) Patient Active Problem List   Diagnosis Date Noted   Chest pain, unspecified 09/23/2020   Adverse reaction to food 07/23/2020   Allergic rhinitis 07/23/2020   Allergic rhinitis due to animal (cat) (dog) hair and dander 07/23/2020   Allergic rhinitis due to pollen 07/23/2020   Chronic allergic conjunctivitis 07/23/2020   Mild persistent asthma, uncomplicated 33/00/7622   Allergy    Asthma    Chronic constipation    Neuropathy, peripheral    Seasonal allergies    Uterine polyp    Wears glasses    B12 deficiency 06/13/2020   Carpal tunnel syndrome of right wrist 01/21/2020   Bilateral carpal tunnel syndrome 11/22/2019   Pineal gland cyst 06/07/2019   RLS (restless legs syndrome) 08/15/2018   Chronic use of opiate drug for therapeutic purpose 08/15/2018   TMJ (temporomandibular joint syndrome) 08/07/2018   Pain in left foot 11/21/2017   Anxiety 09/14/2017   Asymmetric SNHL (sensorineural hearing loss) 07/05/2017   Tinnitus of both ears 07/05/2017   Syncope 06/17/2017   Mixed hyperlipidemia 09/13/2014   Abnormal glucose 09/13/2014   Other fatigue 09/11/2014   Vitamin D deficiency 09/11/2014   Pain in joint, multiple sites 09/11/2014   Medication management 09/11/2014   GERD 08/14/2012   Asthma, chronic 08/14/2012   CMT (Charcot-Marie-Tooth disease) 08/14/2012   Constipation 08/14/2012   Esophageal dysphagia 06/25/2011    Screening Tests Immunization History  Administered Date(s) Administered   PFIZER(Purple Top)SARS-COV-2 Vaccination 09/02/2019, 09/23/2019, 04/01/2020   PPD Test 08/26/2015, 09/16/2016   Tdap 09/16/2016   Preventative  care: Last colonoscopy: 10/2012 per patient, next due in 3 years  EGD 03/26/2020 normal  Last mammogram: 09/2017- has scheduled April 2022 Last pap smear/pelvic exam: s/p hysterectomy Dr. Jerl Santos DEXA: N/A  MRI [facility] 07/27/2018 - no change in pineal cyst. - has scheduled Ct AB/pelvis 05/15/2020 - normal other than large amount of stool   Prior vaccinations: TD or Tdap: 2018  Influenza: 2020, declines Pneumococcal: N/A Prevnar13: N/A shingrix: N/A COVID 19: 3/3, 2021, pfizer   Names of Other Physician/Practitioners you currently use: 1. [facility] here for primary care 2. Dr. Katy Fitch, eye doctor, last visit 2022 3. [employer], dentist, last visit 2022  Patient Care Team: Unk Pinto, MD  as PCP - General (Internal Medicine) Blanch Media, MD as Consulting Physician (Neurology) Renie Ora, MD as Consulting Physician (Anesthesiology) Wylene Simmer, MD as Consulting Physician (Orthopedic Surgery) Susa Day, MD as Consulting Physician (Orthopedic Surgery) Rogene Houston, MD as Consulting Physician (Gastroenterology)  SURGICAL HISTORY She  has a past surgical history that includes Esophageal manometry (05/17/2011); Tonsillectomy; tubal reconstruction (1997); Tubal ligation (1999); Dilation and curettage of uterus; Colonoscopy (N/A, 11/01/2012); Foot neuroma surgery (Right, 06-05-2013); Tonsillectomy (as child); Hysteroscopy with D & C (N/A, 06/27/2013); laparoscopy (N/A, 06/27/2013); and Laparoscopic total hysterectomy (2018). FAMILY HISTORY Her family history includes Brain cancer in her sister; Breast cancer in her sister. SOCIAL HISTORY She  reports that she has never smoked. She has never used smokeless tobacco. She reports that she does not drink alcohol and does not use drugs.       Review of Systems  Constitutional:  Positive for malaise/fatigue. Negative for chills, diaphoresis, fever and weight loss.  HENT:   Negative for congestion, ear discharge, ear pain, hearing loss, nosebleeds, sore throat and tinnitus.   Eyes: Negative.   Respiratory:  Negative for cough, hemoptysis, sputum production, shortness of breath, wheezing and stridor.   Cardiovascular:  Negative for chest pain, palpitations, orthopnea, claudication and leg swelling.  Gastrointestinal:  Positive for abdominal pain (RLQ, chronic but has become more sharp) and constipation. Negative for blood in stool, diarrhea, heartburn, melena, nausea and vomiting.  Genitourinary: Negative.   Musculoskeletal:  Positive for back pain (lumbar, right side), joint pain, myalgias and neck pain. Negative for falls.  Skin: Negative.  Negative for rash.  Neurological:  Positive for headaches. Negative for dizziness, tingling, tremors, sensory change, speech change, focal weakness, seizures, loss of consciousness and weakness.  Psychiatric/Behavioral: Negative.  Negative for depression and memory loss. The patient does not have insomnia.      Objective:     Today's Vitals   12/17/21 0858  BP: 120/68  Pulse: (!) 57  Temp: (!) 97.2 F (36.2 C)  SpO2: 99%  Weight: 162 lb (73.5 kg)  Height: '5\' 6"'  (1.676 m)    Body mass index is 26.15 kg/m.  General appearance: alert, no distress, WD/WN, female HEENT:  normocephalic, sclerae anicteric, TMs pearly, nares patent, no discharge or erythema, pharynx normal Oral cavity: MMM, no lesions Neck: supple, no lymphadenopathy, no thyromegaly, no masses Heart: RRR, normal S1, S2, no murmurs Lungs: CTA bilaterally, no wheezes, rhonchi, or rales Abdomen: +bs, soft, RLQ tender without rebound, non distended, no masses, no hepatomegaly, no splenomegaly Musculoskeletal: Tenderness right lumbar region,  no swelling, no obvious deformity Extremities: no edema, no cyanosis, no clubbing Pulses: 2+ symmetric, upper and lower extremities, normal cap refill Neurological: alert, oriented x 3, CN2-12 intact, strength normal  upper extremities and lower extremities, sensation normal throughout, DTRs 2+ throughout, no cerebellar signs, gait normal Psychiatric: normal affect, behavior normal, pleasant  EKG: Sinus bradycardia AAA: < 3 cm    Alycia Rossetti, NP   12/17/2021

## 2021-12-17 ENCOUNTER — Encounter: Payer: Self-pay | Admitting: Nurse Practitioner

## 2021-12-17 ENCOUNTER — Encounter: Payer: Medicare Other | Admitting: Internal Medicine

## 2021-12-17 ENCOUNTER — Ambulatory Visit (INDEPENDENT_AMBULATORY_CARE_PROVIDER_SITE_OTHER): Payer: Medicare Other | Admitting: Nurse Practitioner

## 2021-12-17 VITALS — BP 120/68 | HR 57 | Temp 97.2°F | Ht 66.0 in | Wt 162.0 lb

## 2021-12-17 DIAGNOSIS — R0989 Other specified symptoms and signs involving the circulatory and respiratory systems: Secondary | ICD-10-CM

## 2021-12-17 DIAGNOSIS — Z79899 Other long term (current) drug therapy: Secondary | ICD-10-CM | POA: Diagnosis not present

## 2021-12-17 DIAGNOSIS — E559 Vitamin D deficiency, unspecified: Secondary | ICD-10-CM

## 2021-12-17 DIAGNOSIS — R1031 Right lower quadrant pain: Secondary | ICD-10-CM

## 2021-12-17 DIAGNOSIS — Z0000 Encounter for general adult medical examination without abnormal findings: Secondary | ICD-10-CM | POA: Diagnosis not present

## 2021-12-17 DIAGNOSIS — G600 Hereditary motor and sensory neuropathy: Secondary | ICD-10-CM

## 2021-12-17 DIAGNOSIS — Z136 Encounter for screening for cardiovascular disorders: Secondary | ICD-10-CM | POA: Diagnosis not present

## 2021-12-17 DIAGNOSIS — G894 Chronic pain syndrome: Secondary | ICD-10-CM

## 2021-12-17 DIAGNOSIS — E782 Mixed hyperlipidemia: Secondary | ICD-10-CM | POA: Diagnosis not present

## 2021-12-17 DIAGNOSIS — J45909 Unspecified asthma, uncomplicated: Secondary | ICD-10-CM

## 2021-12-17 DIAGNOSIS — F419 Anxiety disorder, unspecified: Secondary | ICD-10-CM

## 2021-12-17 DIAGNOSIS — Z1389 Encounter for screening for other disorder: Secondary | ICD-10-CM

## 2021-12-17 DIAGNOSIS — Z1329 Encounter for screening for other suspected endocrine disorder: Secondary | ICD-10-CM

## 2021-12-17 DIAGNOSIS — R7309 Other abnormal glucose: Secondary | ICD-10-CM

## 2021-12-17 DIAGNOSIS — E663 Overweight: Secondary | ICD-10-CM

## 2021-12-17 DIAGNOSIS — M545 Low back pain, unspecified: Secondary | ICD-10-CM

## 2021-12-17 DIAGNOSIS — K219 Gastro-esophageal reflux disease without esophagitis: Secondary | ICD-10-CM

## 2021-12-17 DIAGNOSIS — R5383 Other fatigue: Secondary | ICD-10-CM

## 2021-12-17 DIAGNOSIS — Z0001 Encounter for general adult medical examination with abnormal findings: Secondary | ICD-10-CM

## 2021-12-17 DIAGNOSIS — K5909 Other constipation: Secondary | ICD-10-CM

## 2021-12-17 NOTE — Patient Instructions (Signed)
Ultrasound is scheduled at [facility] You will likely have the ultrasound at [facility]      Abdominal Pain, Adult Pain in the abdomen (abdominal pain) can be caused by many things. Often, abdominal pain is not serious and it gets better with no treatment or by being treated at home. However, sometimes abdominal pain is serious. Your health care provider will ask questions about your medical history and do a physical exam to try to determine the cause of your abdominal pain. Follow these instructions at home: Medicines Take over-the-counter and prescription medicines only as told by your health care provider. Do not take a laxative unless told by your health care provider. General instructions  Watch your condition for any changes. Drink enough fluid to keep your urine pale yellow. Keep all follow-up visits as told by your health care provider. This is important. Contact a health care provider if: Your abdominal pain changes or gets worse. You are not hungry or you lose weight without trying. You are constipated or have diarrhea for more than 2-3 days. You have pain when you urinate or have a bowel movement. Your abdominal pain wakes you up at night. Your pain gets worse with meals, after eating, or with certain foods. You are vomiting and cannot keep anything down. You have a fever. You have blood in your urine. Get help right away if: Your pain does not go away as soon as your health care provider told you to expect. You cannot stop vomiting. Your pain is only in areas of the abdomen, such as the right side or the left lower portion of the abdomen. Pain on the right side could be caused by appendicitis. You have bloody or black stools, or stools that look like tar. You have severe pain, cramping, or bloating in your abdomen. You have signs of dehydration, such as: Dark urine, very little urine, or no urine. Cracked lips. Dry mouth. Sunken  eyes. Sleepiness. Weakness. You have trouble breathing or chest pain. Summary Often, abdominal pain is not serious and it gets better with no treatment or by being treated at home. However, sometimes abdominal pain is serious. Watch your condition for any changes. Take over-the-counter and prescription medicines only as told by your health care provider. Contact a health care provider if your abdominal pain changes or gets worse. Get help right away if you have severe pain, cramping, or bloating in your abdomen. This information is not intended to replace advice given to you by your health care provider. Make sure you discuss any questions you have with your health care provider. Document Revised: 05/04/2019 Document Reviewed: 07/24/2018 Elsevier Patient Education  [place].

## 2021-12-18 ENCOUNTER — Other Ambulatory Visit: Payer: Self-pay | Admitting: Nurse Practitioner

## 2021-12-18 ENCOUNTER — Other Ambulatory Visit: Payer: Medicare Other

## 2021-12-18 DIAGNOSIS — R7989 Other specified abnormal findings of blood chemistry: Secondary | ICD-10-CM

## 2021-12-18 LAB — CBC WITH DIFFERENTIAL/PLATELET
Absolute Monocytes: 378 cells/uL (ref 200–950)
Basophils Absolute: 29 cells/uL (ref 0–200)
Basophils Relative: 0.7 %
Eosinophils Absolute: 118 cells/uL (ref 15–500)
Eosinophils Relative: 2.8 %
HCT: 41.5 % (ref 35.0–45.0)
Hemoglobin: 13.6 g/dL (ref 11.7–15.5)
Lymphs Abs: 1966 cells/uL (ref 850–3900)
MCH: 26.1 pg — ABNORMAL LOW (ref 27.0–33.0)
MCHC: 32.8 g/dL (ref 32.0–36.0)
MCV: 79.7 fL — ABNORMAL LOW (ref 80.0–100.0)
MPV: 9.9 fL (ref 7.5–12.5)
Monocytes Relative: 9 %
Neutro Abs: 1709 cells/uL (ref 1500–7800)
Neutrophils Relative %: 40.7 %
Platelets: 351 10*3/uL (ref 140–400)
RBC: 5.21 10*6/uL — ABNORMAL HIGH (ref 3.80–5.10)
RDW: 13.3 % (ref 11.0–15.0)
Total Lymphocyte: 46.8 %
WBC: 4.2 10*3/uL (ref 3.8–10.8)

## 2021-12-18 LAB — COMPLETE METABOLIC PANEL WITH GFR
AG Ratio: 1.7 (calc) (ref 1.0–2.5)
ALT: 56 U/L — ABNORMAL HIGH (ref 6–29)
AST: 48 U/L — ABNORMAL HIGH (ref 10–35)
Albumin: 4.6 g/dL (ref 3.6–5.1)
Alkaline phosphatase (APISO): 117 U/L (ref 37–153)
BUN: 12 mg/dL (ref 7–25)
CO2: 31 mmol/L (ref 20–32)
Calcium: 9.6 mg/dL (ref 8.6–10.4)
Chloride: 102 mmol/L (ref 98–110)
Creat: 0.83 mg/dL (ref 0.50–1.03)
Globulin: 2.7 g/dL (calc) (ref 1.9–3.7)
Glucose, Bld: 89 mg/dL (ref 65–99)
Potassium: 4.4 mmol/L (ref 3.5–5.3)
Sodium: 139 mmol/L (ref 135–146)
Total Bilirubin: 0.4 mg/dL (ref 0.2–1.2)
Total Protein: 7.3 g/dL (ref 6.1–8.1)
eGFR: 85 mL/min/{1.73_m2} (ref >=60)

## 2021-12-18 LAB — VITAMIN D 25 HYDROXY (VIT D DEFICIENCY, FRACTURES): Vit D, 25-Hydroxy: 40 ng/mL (ref 30–100)

## 2021-12-18 LAB — MICROALBUMIN / CREATININE URINE RATIO
Creatinine, Urine: 171 mg/dL (ref 20–275)
Microalb Creat Ratio: 2 mcg/mg creat (ref <30)
Microalb, Ur: 0.3 mg/dL

## 2021-12-18 LAB — URINALYSIS, ROUTINE W REFLEX MICROSCOPIC
Bilirubin Urine: NEGATIVE
Glucose, UA: NEGATIVE
Hgb urine dipstick: NEGATIVE
Ketones, ur: NEGATIVE
Leukocytes,Ua: NEGATIVE
Nitrite: NEGATIVE
Protein, ur: NEGATIVE
Specific Gravity, Urine: 1.023 (ref 1.001–1.035)
pH: 5.5 (ref 5.0–8.0)

## 2021-12-18 LAB — LIPID PANEL
Cholesterol: 197 mg/dL (ref <200)
HDL: 69 mg/dL (ref >=50)
LDL Cholesterol (Calc): 113 mg/dL (calc) — ABNORMAL HIGH
Non-HDL Cholesterol (Calc): 128 mg/dL (calc) (ref <130)
Total CHOL/HDL Ratio: 2.9 (calc) (ref <5.0)
Triglycerides: 60 mg/dL (ref <150)

## 2021-12-18 LAB — HEMOGLOBIN A1C
Hgb A1c MFr Bld: 5.8 % of total Hgb — ABNORMAL HIGH (ref <5.7)
Mean Plasma Glucose: 120 mg/dL
eAG (mmol/L): 6.6 mmol/L

## 2021-12-18 LAB — MAGNESIUM: Magnesium: 2 mg/dL (ref 1.5–2.5)

## 2021-12-18 LAB — TSH: TSH: 1.41 mIU/L

## 2021-12-20 DIAGNOSIS — K219 Gastro-esophageal reflux disease without esophagitis: Secondary | ICD-10-CM | POA: Diagnosis not present

## 2021-12-20 DIAGNOSIS — Z882 Allergy status to sulfonamides status: Secondary | ICD-10-CM | POA: Diagnosis not present

## 2021-12-20 DIAGNOSIS — Z79899 Other long term (current) drug therapy: Secondary | ICD-10-CM | POA: Diagnosis not present

## 2021-12-20 DIAGNOSIS — Z885 Allergy status to narcotic agent status: Secondary | ICD-10-CM | POA: Diagnosis not present

## 2021-12-20 DIAGNOSIS — R1011 Right upper quadrant pain: Secondary | ICD-10-CM | POA: Diagnosis not present

## 2021-12-20 DIAGNOSIS — G894 Chronic pain syndrome: Secondary | ICD-10-CM | POA: Diagnosis not present

## 2021-12-20 DIAGNOSIS — R7989 Other specified abnormal findings of blood chemistry: Secondary | ICD-10-CM | POA: Diagnosis not present

## 2021-12-20 DIAGNOSIS — R109 Unspecified abdominal pain: Secondary | ICD-10-CM | POA: Diagnosis not present

## 2021-12-20 DIAGNOSIS — J45909 Unspecified asthma, uncomplicated: Secondary | ICD-10-CM | POA: Diagnosis not present

## 2021-12-20 DIAGNOSIS — I10 Essential (primary) hypertension: Secondary | ICD-10-CM | POA: Diagnosis not present

## 2021-12-20 DIAGNOSIS — Z880 Allergy status to penicillin: Secondary | ICD-10-CM | POA: Diagnosis not present

## 2021-12-20 DIAGNOSIS — Z79891 Long term (current) use of opiate analgesic: Secondary | ICD-10-CM | POA: Diagnosis not present

## 2021-12-20 DIAGNOSIS — R6883 Chills (without fever): Secondary | ICD-10-CM | POA: Diagnosis not present

## 2021-12-21 ENCOUNTER — Ambulatory Visit: Payer: Medicare Other | Admitting: Cardiology

## 2021-12-22 ENCOUNTER — Ambulatory Visit
Admission: RE | Admit: 2021-12-22 | Discharge: 2021-12-22 | Disposition: A | Discharge status: Home / Self Care | Payer: Medicare Other | Source: Ambulatory Visit | Attending: Nurse Practitioner | Admitting: Nurse Practitioner

## 2021-12-22 DIAGNOSIS — R7989 Other specified abnormal findings of blood chemistry: Secondary | ICD-10-CM

## 2021-12-22 DIAGNOSIS — R1031 Right lower quadrant pain: Secondary | ICD-10-CM

## 2021-12-22 DIAGNOSIS — R945 Abnormal results of liver function studies: Secondary | ICD-10-CM | POA: Diagnosis not present

## 2021-12-23 ENCOUNTER — Other Ambulatory Visit: Payer: Medicare Other

## 2022-01-07 ENCOUNTER — Telehealth: Payer: Self-pay | Admitting: Nurse Practitioner

## 2022-01-07 ENCOUNTER — Telehealth: Payer: Self-pay | Admitting: Internal Medicine

## 2022-01-07 NOTE — Telephone Encounter (Signed)
patient called to request referral to establish care with a new gastroenterology provider. Patient would like a referral sent to [facility], Dr. Birdie Hopes, [phone]. Advised patient she will need to request her GI records from all previous gastro providers, before a request for referral can be sent. Entire GI hx chart notes needed for referral. Patient understands and will request. She will advise once completed.

## 2022-01-07 NOTE — Telephone Encounter (Signed)
Patient is requesting a referral to a new GI Dr. She would like to see Dr. Birdie Hopes at [facility]. His phone number is [phone].

## 2022-01-08 NOTE — Telephone Encounter (Signed)
Patient getting historical records from gastro offices. [facility], [place], & [facility]. I will advise patient when all records received.

## 2022-01-08 NOTE — Telephone Encounter (Signed)
Can you see if she has seen a GI and will this even be possible

## 2022-01-18 ENCOUNTER — Other Ambulatory Visit: Payer: Self-pay

## 2022-01-18 DIAGNOSIS — H905 Unspecified sensorineural hearing loss: Secondary | ICD-10-CM | POA: Diagnosis not present

## 2022-01-27 ENCOUNTER — Ambulatory Visit: Payer: Medicare Other | Admitting: Cardiology

## 2022-02-01 DIAGNOSIS — J069 Acute upper respiratory infection, unspecified: Secondary | ICD-10-CM | POA: Diagnosis not present

## 2022-02-01 DIAGNOSIS — R509 Fever, unspecified: Secondary | ICD-10-CM | POA: Diagnosis not present

## 2022-02-01 DIAGNOSIS — R059 Cough, unspecified: Secondary | ICD-10-CM | POA: Diagnosis not present

## 2022-02-04 ENCOUNTER — Encounter: Payer: Self-pay | Admitting: Internal Medicine

## 2022-02-12 ENCOUNTER — Telehealth: Payer: Self-pay | Admitting: Internal Medicine

## 2022-02-12 NOTE — Telephone Encounter (Signed)
Patient had requested her medical records be sent to [facility] to establish as new patient for 2nd opinion on GI issues. [facility] denial letter mailed to patient

## 2022-02-17 ENCOUNTER — Ambulatory Visit: Payer: Medicare Other | Attending: Cardiology | Admitting: Cardiology

## 2022-02-17 ENCOUNTER — Encounter: Payer: Self-pay | Admitting: Cardiology

## 2022-02-17 VITALS — BP 116/70 | HR 62 | Ht 66.0 in | Wt 158.0 lb

## 2022-02-17 DIAGNOSIS — K219 Gastro-esophageal reflux disease without esophagitis: Secondary | ICD-10-CM

## 2022-02-17 DIAGNOSIS — E782 Mixed hyperlipidemia: Secondary | ICD-10-CM | POA: Diagnosis not present

## 2022-02-17 DIAGNOSIS — R079 Chest pain, unspecified: Secondary | ICD-10-CM | POA: Diagnosis not present

## 2022-02-17 DIAGNOSIS — R011 Cardiac murmur, unspecified: Secondary | ICD-10-CM | POA: Diagnosis not present

## 2022-02-17 DIAGNOSIS — R1319 Other dysphagia: Secondary | ICD-10-CM | POA: Diagnosis not present

## 2022-02-17 NOTE — Patient Instructions (Signed)
Medication Instructions:  Your physician recommends that you continue on your current medications as directed. Please refer to the Current Medication list given to you today.  *If you need a refill on your cardiac medications before your next appointment, please call your pharmacy*   Lab Work: None ordered If you have labs (blood work) drawn today and your tests are completely normal, you will receive your results only by: [place] (if you have MyChart) OR A paper copy in the mail If you have any lab test that is abnormal or we need to change your treatment, we will call you to review the results.   Testing/Procedures:      Stress Echocardiogram Information Sheet                                                      Instructions:    1. You may take your morning medications the morning of the test  2. Light breakfast.  3. Dress prepared to exercise.  4. DO NOT use ANY caffeine or tobacco products 3 hours before appointment.  5. Please bring all current prescription medications.  Your physician has requested that you have an echocardiogram. Echocardiography is a painless test that uses sound waves to create images of your heart. It provides your doctor with information about the size and shape of your heart and how well your heart's chambers and valves are working. This procedure takes approximately one hour. There are no restrictions for this procedure. Please do NOT wear cologne, perfume, aftershave, or lotions (deodorant is allowed). Please arrive 15 minutes prior to your appointment time.   Follow-Up: At [facility], you and your health needs are our priority.  As part of our continuing mission to provide you with exceptional heart care, we have created designated Provider Care Teams.  These Care Teams include your primary Cardiologist (physician) and Advanced Practice Providers (APPs -  Physician Assistants and Nurse Practitioners) who all work together to provide  you with the care you need, when you need it.  We recommend signing up for the patient portal called "MyChart".  Sign up information is provided on this After Visit Summary.  MyChart is used to connect with patients for Virtual Visits (Telemedicine).  Patients are able to view lab/test results, encounter notes, upcoming appointments, etc.  Non-urgent messages can be sent to your provider as well.   To learn more about what you can do with MyChart, go to [email or web address].    Your next appointment:   12 month(s)  The format for your next appointment:   In Person  Provider:   Jyl Heinz, MD   Other Instructions Exercise Stress Echocardiogram An exercise stress echocardiogram is a test to check how well your heart is working. This test uses sound waves and a computer to make pictures of your heart. These pictures will be taken before and after you exercise. For this test, you will walk on a treadmill or ride a bicycle to make your heart beat faster. While you exercise, your heart will be checked with an electrocardiogram (ECG). Your blood pressure will also be checked. You may have this test if: You have chest pain or a heart problem. You had a heart attack or heart surgery not long ago. You have heart valve problems. You have a condition that causes narrowing of  the blood vessels that supply your heart. You have a high risk of heart disease and: You are starting a new exercise program. You need to have a big surgery. Tell a doctor about: Any allergies you have. All medicines you are taking. This includes vitamins, herbs, eye drops, creams, and over-the-counter medicines. Any problems you or family members have had with medicines that make you fall asleep (anesthetic medicines). Any surgeries you have had. Any blood disorders you have. Any medical conditions you have. Whether you are pregnant or may be pregnant. What are the risks? Generally, this is a safe test. However,  problems may occur, including: Chest pain. Feeling dizzy or light-headed. Shortness of breath. Increased or irregular heartbeat. Feeling like you may vomit (nausea) or vomiting. Heart attack. This is very rare. What happens before the test? Medicines Ask your doctor about changing or stopping your normal medicines. This is important if you take diabetes medicines or blood thinners. If you use an inhaler, bring it to the test. General instructions Wear comfortable clothes and walking shoes. Follow instructions from your doctor about what you cannot eat or drink before the test. Do not drink or eat anything that has caffeine in it. Stop having caffeine 24 hours before the test. Do not smoke or use products that contain nicotine or tobacco for 4 hours before the test. If you need help quitting, ask your doctor. What happens during the test?  You will take off your clothes from the waist up and put on a hospital gown. Electrodes or patches will be put on your chest. A blood pressure cuff will be put on your arm. Before you exercise, a computer will make a picture of your heart. To do this: You will lie down and a gel will be put on your chest. A wand will be moved over the gel. Sound waves from the wand will go to the computer to make the picture. Then, you will start to exercise. You may walk on a treadmill or pedal a bicycle. Your blood pressure and heart rhythm will be checked while you exercise. The exercise will get harder or faster. You will exercise until: Your heart reaches a certain level. You are too tired to go on. You cannot go on because of chest pain, weakness, or dizziness. You will lie down right away so another picture of your heart can be taken. The procedure may vary among doctors and hospitals. What can I expect after the test? After your test, it is common to have: Mild soreness. Mild tiredness. Your heart rate and blood pressure will be checked until they  return to your normal levels. You should not have any new symptoms after this test. Follow these instructions at home: If your doctor says that you can, you may: Eat what you normally eat. Do your normal activities. Take over-the-counter and prescription medicines only as told by your doctor. Keep all follow-up visits. It is up to you to get the results of your test. Ask how to get your results when they are ready. Contact a doctor if: You feel dizzy or light-headed. You have a fast or irregular heartbeat. You feel like you may vomit or you vomit. You have a headache. You feel short of breath. Get help right away if: You develop pain or pressure: In your chest. In your jaw or neck. Between your shoulders. That goes down your left arm. You faint. You have trouble breathing. These symptoms may be an emergency. Get medical help right  away. Call your local emergency services (911 in the [place] Do not wait to see if the symptoms will go away. Do not drive yourself to the hospital. Summary This is a test that checks how well your heart is working. Follow instructions about what you cannot eat or drink before the test. Ask your doctor if you should take your normal medicines before the test. Stop having caffeine 24 hours before the test. Do not smoke or use products with nicotine or tobacco in them for 4 hours before the test. During the test, your blood pressure and heart rhythm will be checked while you exercise. This information is not intended to replace advice given to you by your health care provider. Make sure you discuss any questions you have with your health care provider. Document Revised: 11/26/2020 Document Reviewed: 11/06/2019 Elsevier Patient Education  2022 [employer].

## 2022-02-17 NOTE — Progress Notes (Signed)
Cardiology Office Note:    Date:  02/17/2022   ID:  Brandi Erickson, DOB 05/05/1970, MRN [id number]  PCP:  Unk Pinto, MD  Cardiologist:  Jenean Lindau, MD   Referring MD: Unk Pinto, MD    ASSESSMENT:    1. Mixed hyperlipidemia   2. Chest pain, unspecified type   3. Gastroesophageal reflux disease, unspecified whether esophagitis present   4. Esophageal dysphagia    PLAN:    In order of problems listed above:  Primary prevention stressed with the patient.  Importance of compliance with diet medication stressed and she vocalized understanding. Cardiac murmur: Echocardiogram will be done to assess murmur heard on auscultation. Chest pain: She is an active lady.  Chest pain is of concern to her and she wants to increase her exercise intensity.  For this reason I suggested exercise stress echo which she is agreeable. Mild dyslipidemia: Diet was emphasized.  Lifestyle modification urged and she promises to do better.  Lipids were reviewed and questions were answered to her satisfaction. Patient will be seen in follow-up appointment in 6 months or earlier if the patient has any concerns    Medication Adjustments/Labs and Tests Ordered: Current medicines are reviewed at length with the patient today.  Concerns regarding medicines are outlined above.  Orders Placed This Encounter  Procedures   Ambulatory referral to Gastroenterology   ECHOCARDIOGRAM COMPLETE   ECHOCARDIOGRAM STRESS TEST   No orders of the defined types were placed in this encounter.    No chief complaint on file.    History of Present Illness:    Brandi Erickson is a 51 y.o. female.  Patient has no significant past medical history.  She gives history of some chest discomfort at times.  This is not related to exertion.  She walks on a regular basis.  However it is more of a stabbing-like sensation and she has been evaluated for this.  She is concerned and is here to discuss this finding.  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   B12 deficiency 06/13/2020   Bilateral carpal tunnel syndrome 11/22/2019   Carpal tunnel syndrome of right wrist 01/21/2020   Chest pain, unspecified 09/23/2020   Chronic allergic conjunctivitis 07/23/2020   Chronic constipation    Chronic use of opiate drug for therapeutic purpose 08/15/2018   CMT (Charcot-Marie-Tooth disease)    Constipation 08/14/2012   Esophageal dysphagia 06/25/2011   GERD (gastroesophageal reflux disease)    Medication management 09/11/2014   Mild persistent asthma, uncomplicated 8/54/6270   Mixed hyperlipidemia 09/13/2014   Neuropathy, peripheral    upper and lower extremities seconardy to scharcot-marie  tooth disease   Other fatigue 09/11/2014   Pain in joint, multiple sites 09/11/2014   Pain in left foot 11/21/2017   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 Geraldo Pitter in cardiology, had normal stress test 06/20/2019.    Tinnitus of both ears 07/05/2017   TMJ (temporomandibular joint syndrome) 08/07/2018   Uterine polyp    Vitamin D deficiency 09/11/2014   Wears  glasses     Past Surgical History:  Procedure Laterality Date   COLONOSCOPY N/A 11/01/2012   Procedure: COLONOSCOPY;  Surgeon: Rogene Houston, MD;  [facility];  Service: Endoscopy;  Laterality: N/A;  [place] MANOMETRY  05/17/2011   Procedure: ESOPHAGEAL MANOMETRY (EM);  Surgeon: Beryle Beams, MD;  Location: [facility];  Service: Endoscopy;  Laterality: N/A;   FOOT NEUROMA SURGERY Right 06-05-2013    HYSTEROSCOPY WITH D & C N/A 06/27/2013   Procedure: HYSTEROSCOPY POLPYECTOMY  ;  Surgeon: Governor Specking, MD;  Location: [place];  Service: Gynecology;  Laterality: N/A;   LAPAROSCOPIC TOTAL HYSTERECTOMY  2018   [facility]   LAPAROSCOPY N/A 06/27/2013   Procedure: LAPAROSCOPY WITH extensive lysis of adhesions;  Surgeon: Governor Specking, MD;  Location: [place];  Service: Gynecology;  Laterality: N/A;   TONSILLECTOMY     TONSILLECTOMY  as child   TUBAL LIGATION  1999   tubal reconstruction  1997    Current Medications: Current Meds  Medication Sig   albuterol (PROAIR HFA) 108 (90 Base) MCG/ACT inhaler Inhale 2 puffs into the lungs every 6 (six) hours as needed for wheezing or shortness of breath.   cetirizine (ZYRTEC) 10 MG tablet Take 10 mg by mouth every evening.   Cholecalciferol (VITAMIN D) 125 MCG (5000 UT) CAPS Take 1 capsule by mouth every other day.   EPINEPHrine 0.3 mg/0.3 mL IJ SOAJ injection Inject 0.3 mg into the skin as needed for anaphylaxis.   fluticasone (CUTIVATE) 0.05 % cream Apply 1 application. topically 2 (two) times daily.   fluticasone (FLONASE) 50 MCG/ACT nasal spray Place 1-2 sprays into both nostrils daily.   fluticasone furoate-vilanterol (BREO ELLIPTA) 100-25 MCG/ACT AEPB Use  1 Inhalation  Daily for Asthma   gabapentin (NEURONTIN) 800 MG tablet Take 1 tablet (800 mg total) by mouth in the morning, at noon, in the evening, and at bedtime.   methocarbamol (ROBAXIN) 500 MG tablet Take 1 tablet (500 mg total) by mouth 3 (three) times daily as needed for muscle spasms.   montelukast (SINGULAIR) 10 MG tablet Take 1 tablet (10 mg total) by mouth at bedtime.   pantoprazole (PROTONIX) 40 MG tablet Take 1 tablet (40 mg total) by mouth 2 (two) times daily before a meal.   polyethylene glycol (MIRALAX / GLYCOLAX) 17 g packet Take 17 g by mouth daily.   Prucalopride Succinate (MOTEGRITY) 2 MG TABS Take   1 tablet  Daily  for Chronic Constipation    tretinoin (RETIN-A) 0.05 % cream Apply 1 application topically at bedtime.   venlafaxine XR (EFFEXOR XR) 37.5 MG 24 hr capsule Take 1 capsule (37.5 mg total) by mouth daily with breakfast.     Allergies:   Peanut-containing drug products, Linaclotide, Iodinated contrast media, Methocarbamol, Oxycodone hcl, Penicillins, Shellfish allergy, Sulfa antibiotics, Topamax [topiramate], Lactose, Oxycodone, and Tramadol   Social History   Socioeconomic History   Marital status: Single    Spouse name: Not on file   Number of children: Not on file   Years of education: Not on file   Highest education level: Not on file  Occupational History   Not on file  Tobacco Use   Smoking status: Never   Smokeless tobacco: Never  Vaping Use   Vaping Use: Never used  Substance and Sexual Activity   Alcohol use: No   Drug use: No   Sexual activity:  Yes  Other Topics Concern   Not on file  Social History Narrative   ** Merged History Encounter **       Social Determinants of Health   Financial Resource Strain: Not on file  Food Insecurity: Not on file  Transportation Needs: Not on file  Physical Activity: Not on file  Stress: Not on file  Social Connections: Not on file     Family History: The patient's family history includes Brain cancer in her sister; Breast cancer in her sister.  ROS:   Please see the history of present illness.    All other systems reviewed and are negative.  EKGs/Labs/Other Studies Reviewed:    The following studies were reviewed today: EKG reveals sinus rhythm and nonspecific ST-T changes   Recent Labs: 12/17/2021: ALT 56; BUN 12; Creat 0.83; Hemoglobin 13.6; Magnesium 2.0; Platelets 351; Potassium 4.4; Sodium 139; TSH 1.41  Recent Lipid Panel    Component Value Date/Time   CHOL 197 12/17/2021 0954   TRIG 60 12/17/2021 0954   HDL 69 12/17/2021 0954   CHOLHDL 2.9 12/17/2021 0954   VLDL 16 09/16/2016 0919   LDLCALC 113 (H) 12/17/2021 0954    Physical  Exam:    VS:  BP 116/70   Pulse 62   Ht '5\' 6"'$  (1.676 m)   Wt 158 lb (71.7 kg)   LMP 02/13/2016   SpO2 96%   BMI 25.50 kg/m     Wt Readings from Last 3 Encounters:  02/17/22 158 lb (71.7 kg)  12/17/21 162 lb (73.5 kg)  07/29/21 157 lb 6.4 oz (71.4 kg)     GEN: Patient is in no acute distress HEENT: Normal NECK: No JVD; No carotid bruits LYMPHATICS: No lymphadenopathy CARDIAC: Hear sounds regular, 2/6 systolic murmur at the apex. RESPIRATORY:  Clear to auscultation without rales, wheezing or rhonchi  ABDOMEN: Soft, non-tender, non-distended MUSCULOSKELETAL:  No edema; No deformity  SKIN: Warm and dry NEUROLOGIC:  Alert and oriented x 3 PSYCHIATRIC:  Normal affect   Signed, Jenean Lindau, MD  02/17/2022 3:39 PM    [facility]

## 2022-02-22 DIAGNOSIS — M791 Myalgia, unspecified site: Secondary | ICD-10-CM | POA: Diagnosis not present

## 2022-02-22 DIAGNOSIS — Z79891 Long term (current) use of opiate analgesic: Secondary | ICD-10-CM | POA: Diagnosis not present

## 2022-02-22 DIAGNOSIS — M47812 Spondylosis without myelopathy or radiculopathy, cervical region: Secondary | ICD-10-CM | POA: Diagnosis not present

## 2022-02-22 DIAGNOSIS — G894 Chronic pain syndrome: Secondary | ICD-10-CM | POA: Diagnosis not present

## 2022-03-02 ENCOUNTER — Telehealth (HOSPITAL_COMMUNITY): Payer: Self-pay

## 2022-03-02 NOTE — Telephone Encounter (Signed)
Spoke with the patient, detailed instructions given. She stated that she understood and would be here for her test. Asked to call back with any questions. S.Correll Denbow EMTP 

## 2022-03-04 ENCOUNTER — Ambulatory Visit: Payer: Medicare Other | Attending: Cardiology

## 2022-03-04 DIAGNOSIS — R079 Chest pain, unspecified: Secondary | ICD-10-CM | POA: Diagnosis not present

## 2022-03-04 DIAGNOSIS — E782 Mixed hyperlipidemia: Secondary | ICD-10-CM

## 2022-03-04 LAB — ECHOCARDIOGRAM COMPLETE
Area-P 1/2: 2.48 cm2
S' Lateral: 3 cm

## 2022-03-09 ENCOUNTER — Ambulatory Visit: Payer: Medicare Other

## 2022-03-09 ENCOUNTER — Ambulatory Visit: Payer: Medicare Other | Attending: Cardiology

## 2022-03-09 DIAGNOSIS — E782 Mixed hyperlipidemia: Secondary | ICD-10-CM

## 2022-03-09 DIAGNOSIS — R079 Chest pain, unspecified: Secondary | ICD-10-CM | POA: Diagnosis not present

## 2022-03-24 ENCOUNTER — Other Ambulatory Visit: Payer: Self-pay | Admitting: Internal Medicine

## 2022-03-24 DIAGNOSIS — Z1231 Encounter for screening mammogram for malignant neoplasm of breast: Secondary | ICD-10-CM

## 2022-03-25 ENCOUNTER — Ambulatory Visit
Admission: RE | Admit: 2022-03-25 | Discharge: 2022-03-25 | Disposition: A | Discharge status: Home / Self Care | Payer: Medicare Other | Source: Ambulatory Visit

## 2022-03-25 DIAGNOSIS — Z1231 Encounter for screening mammogram for malignant neoplasm of breast: Secondary | ICD-10-CM

## 2022-04-22 ENCOUNTER — Ambulatory Visit
Admission: RE | Admit: 2022-04-22 | Discharge: 2022-04-22 | Disposition: A | Discharge status: Home / Self Care | Payer: 59 | Source: Ambulatory Visit | Attending: Nurse Practitioner | Admitting: Nurse Practitioner

## 2022-04-22 ENCOUNTER — Other Ambulatory Visit: Payer: Self-pay | Admitting: Nurse Practitioner

## 2022-04-22 DIAGNOSIS — M542 Cervicalgia: Secondary | ICD-10-CM

## 2022-04-22 DIAGNOSIS — M545 Low back pain, unspecified: Secondary | ICD-10-CM

## 2022-06-17 ENCOUNTER — Ambulatory Visit: Payer: Medicare Other | Admitting: Nurse Practitioner

## 2022-06-29 DIAGNOSIS — E348 Other specified endocrine disorders: Secondary | ICD-10-CM | POA: Diagnosis not present

## 2022-07-13 DIAGNOSIS — M79604 Pain in right leg: Secondary | ICD-10-CM | POA: Diagnosis not present

## 2022-07-13 DIAGNOSIS — Z79891 Long term (current) use of opiate analgesic: Secondary | ICD-10-CM | POA: Diagnosis not present

## 2022-07-30 ENCOUNTER — Ambulatory Visit: Payer: Medicare Other | Admitting: Nurse Practitioner

## 2022-08-11 ENCOUNTER — Other Ambulatory Visit: Payer: Self-pay | Admitting: Nurse Practitioner

## 2022-08-11 DIAGNOSIS — J45909 Unspecified asthma, uncomplicated: Secondary | ICD-10-CM

## 2022-09-17 ENCOUNTER — Ambulatory Visit: Payer: Medicare Other | Admitting: Nurse Practitioner

## 2022-12-20 ENCOUNTER — Encounter: Payer: Medicare Other | Admitting: Nurse Practitioner
